# Patient Record
Sex: Male | Born: 1950
Health system: Southern US, Community
[De-identification: ages and names within clinical notes are randomized; demographics above are authoritative.]

## PROBLEM LIST (undated history)

## (undated) DIAGNOSIS — H409 Unspecified glaucoma: Secondary | ICD-10-CM

## (undated) DIAGNOSIS — I779 Disorder of arteries and arterioles, unspecified: Secondary | ICD-10-CM

## (undated) DIAGNOSIS — I739 Peripheral vascular disease, unspecified: Secondary | ICD-10-CM

## (undated) DIAGNOSIS — H5440 Blindness, one eye, unspecified eye: Secondary | ICD-10-CM

## (undated) DIAGNOSIS — F1011 Alcohol abuse, in remission: Secondary | ICD-10-CM

## (undated) DIAGNOSIS — F419 Anxiety disorder, unspecified: Secondary | ICD-10-CM

## (undated) DIAGNOSIS — E1142 Type 2 diabetes mellitus with diabetic polyneuropathy: Principal | ICD-10-CM

## (undated) DIAGNOSIS — M79603 Pain in arm, unspecified: Secondary | ICD-10-CM

## (undated) DIAGNOSIS — IMO0002 Reserved for concepts with insufficient information to code with codable children: Secondary | ICD-10-CM

## (undated) DIAGNOSIS — I10 Essential (primary) hypertension: Secondary | ICD-10-CM

## (undated) DIAGNOSIS — R9430 Abnormal result of cardiovascular function study, unspecified: Secondary | ICD-10-CM

## (undated) DIAGNOSIS — L0390 Cellulitis, unspecified: Secondary | ICD-10-CM

## (undated) DIAGNOSIS — Z720 Tobacco use: Secondary | ICD-10-CM

## (undated) DIAGNOSIS — J449 Chronic obstructive pulmonary disease, unspecified: Secondary | ICD-10-CM

## (undated) DIAGNOSIS — I2510 Atherosclerotic heart disease of native coronary artery without angina pectoris: Secondary | ICD-10-CM

## (undated) DIAGNOSIS — Z889 Allergy status to unspecified drugs, medicaments and biological substances status: Secondary | ICD-10-CM

## (undated) DIAGNOSIS — N529 Male erectile dysfunction, unspecified: Secondary | ICD-10-CM

## (undated) DIAGNOSIS — L4050 Arthropathic psoriasis, unspecified: Secondary | ICD-10-CM

## (undated) DIAGNOSIS — M797 Fibromyalgia: Secondary | ICD-10-CM

## (undated) DIAGNOSIS — E785 Hyperlipidemia, unspecified: Secondary | ICD-10-CM

## (undated) DIAGNOSIS — K219 Gastro-esophageal reflux disease without esophagitis: Secondary | ICD-10-CM

## (undated) DIAGNOSIS — I6340 Cerebral infarction due to embolism of unspecified cerebral artery: Secondary | ICD-10-CM

## (undated) DIAGNOSIS — Z951 Presence of aortocoronary bypass graft: Secondary | ICD-10-CM

## (undated) DIAGNOSIS — Z789 Other specified health status: Secondary | ICD-10-CM

## (undated) DIAGNOSIS — H9319 Tinnitus, unspecified ear: Secondary | ICD-10-CM

## (undated) HISTORY — DX: Atherosclerotic heart disease of native coronary artery without angina pectoris: I25.10

## (undated) HISTORY — DX: Other specified health status: Z78.9

## (undated) HISTORY — DX: Type 2 diabetes mellitus with diabetic polyneuropathy: E11.42

## (undated) HISTORY — DX: Fibromyalgia: M79.7

## (undated) HISTORY — DX: Peripheral vascular disease, unspecified: I73.9

## (undated) HISTORY — DX: Disorder of arteries and arterioles, unspecified: I77.9

## (undated) HISTORY — DX: Male erectile dysfunction, unspecified: N52.9

## (undated) HISTORY — DX: Cellulitis, unspecified: L03.90

## (undated) HISTORY — DX: Essential (primary) hypertension: I10

## (undated) HISTORY — DX: Reserved for concepts with insufficient information to code with codable children: IMO0002

## (undated) HISTORY — DX: Presence of aortocoronary bypass graft: Z95.1

## (undated) HISTORY — DX: Pain in arm, unspecified: M79.603

## (undated) HISTORY — DX: Tinnitus, unspecified ear: H93.19

## (undated) HISTORY — DX: Abnormal result of cardiovascular function study, unspecified: R94.30

## (undated) HISTORY — DX: Alcohol abuse, in remission: F10.11

## (undated) HISTORY — DX: Arthropathic psoriasis, unspecified: L40.50

## (undated) HISTORY — DX: Hyperlipidemia, unspecified: E78.5

## (undated) HISTORY — DX: Cerebral infarction due to embolism of unspecified cerebral artery: I63.40

## (undated) HISTORY — PX: CATARACT EXTRACTION: SUR2

## (undated) HISTORY — DX: Allergy status to unspecified drugs, medicaments and biological substances: Z88.9

## (undated) HISTORY — DX: Unspecified glaucoma: H40.9

## (undated) HISTORY — PX: CORONARY ARTERY BYPASS GRAFT: SHX141

## (undated) HISTORY — DX: Tobacco use: Z72.0

---

## 2007-09-18 DIAGNOSIS — Z951 Presence of aortocoronary bypass graft: Secondary | ICD-10-CM

## 2007-09-18 HISTORY — DX: Presence of aortocoronary bypass graft: Z95.1

## 2007-10-05 ENCOUNTER — Ambulatory Visit: Payer: Self-pay | Admitting: Cardiovascular Disease

## 2007-10-05 ENCOUNTER — Inpatient Hospital Stay (HOSPITAL_COMMUNITY): Admission: AD | Admit: 2007-10-05 | Discharge: 2007-10-14 | Payer: Self-pay | Admitting: Cardiovascular Disease

## 2007-10-06 ENCOUNTER — Ambulatory Visit: Payer: Self-pay | Admitting: Cardiothoracic Surgery

## 2007-10-06 ENCOUNTER — Encounter: Payer: Self-pay | Admitting: Cardiovascular Disease

## 2007-10-06 ENCOUNTER — Encounter: Payer: Self-pay | Admitting: Cardiothoracic Surgery

## 2007-10-30 ENCOUNTER — Ambulatory Visit: Payer: Self-pay | Admitting: Cardiothoracic Surgery

## 2007-10-30 ENCOUNTER — Encounter: Admission: RE | Admit: 2007-10-30 | Discharge: 2007-10-30 | Payer: Self-pay | Admitting: Cardiothoracic Surgery

## 2007-11-09 ENCOUNTER — Encounter: Payer: Self-pay | Admitting: Cardiology

## 2007-11-09 ENCOUNTER — Inpatient Hospital Stay (HOSPITAL_COMMUNITY): Admission: EM | Admit: 2007-11-09 | Discharge: 2007-11-11 | Payer: Self-pay | Admitting: Cardiology

## 2007-11-09 ENCOUNTER — Ambulatory Visit: Payer: Self-pay | Admitting: Cardiology

## 2007-11-10 ENCOUNTER — Encounter (INDEPENDENT_AMBULATORY_CARE_PROVIDER_SITE_OTHER): Payer: Self-pay | Admitting: Cardiology

## 2007-12-03 ENCOUNTER — Ambulatory Visit: Payer: Self-pay | Admitting: Cardiology

## 2008-02-01 ENCOUNTER — Ambulatory Visit: Payer: Self-pay | Admitting: Cardiology

## 2008-08-23 ENCOUNTER — Ambulatory Visit: Payer: Self-pay | Admitting: Cardiology

## 2008-09-05 IMAGING — CR DG CHEST 1V PORT
1 series · 1 of 1 positions shown · non-contrast
Comparison: 10/30/2007

CLINICAL DATA: Unstable angina

PORTABLE CHEST - 1 VIEW

[AP]
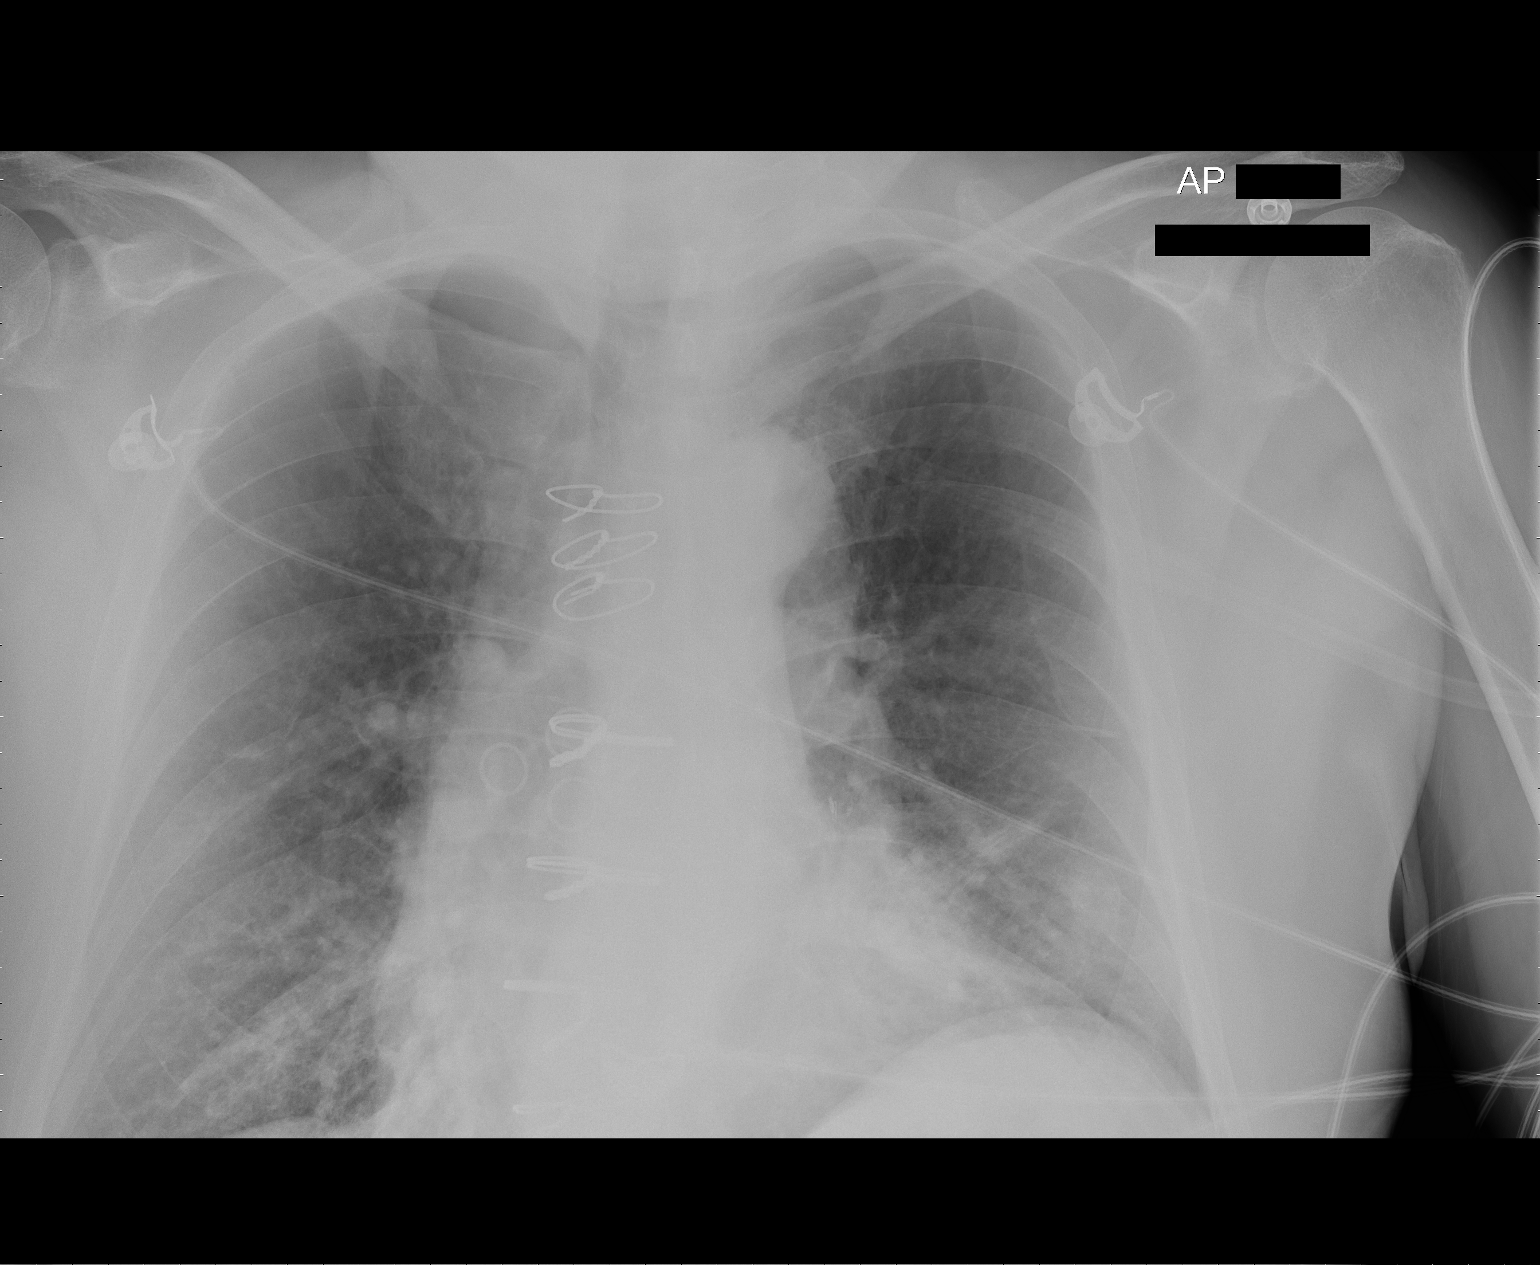

[1 of 1 positions shown; findings below may reference images not displayed]

FINDINGS: There are surgical changes related to bypass surgery.
The heart is mildly enlarged but stable.  There are chronic lung
changes and COPD with probable mild overlying asymmetric edema.  No
effusions.  No pneumothorax.
IMPRESSION: 1.  Cardiac enlargement and probable mild asymmetric interstitial
edema.
2.  Underlying chronic lung changes/COPD.

## 2009-04-03 ENCOUNTER — Encounter: Payer: Self-pay | Admitting: Cardiology

## 2009-04-04 ENCOUNTER — Encounter (INDEPENDENT_AMBULATORY_CARE_PROVIDER_SITE_OTHER): Payer: Self-pay | Admitting: *Deleted

## 2009-04-05 ENCOUNTER — Ambulatory Visit: Payer: Self-pay | Admitting: Cardiology

## 2010-02-19 ENCOUNTER — Ambulatory Visit (HOSPITAL_COMMUNITY): Admission: RE | Admit: 2010-02-19 | Discharge: 2010-02-19 | Payer: Self-pay | Admitting: Ophthalmology

## 2010-04-09 ENCOUNTER — Ambulatory Visit: Payer: Self-pay | Admitting: Cardiology

## 2010-04-10 ENCOUNTER — Telehealth (INDEPENDENT_AMBULATORY_CARE_PROVIDER_SITE_OTHER): Payer: Self-pay | Admitting: *Deleted

## 2010-05-02 ENCOUNTER — Ambulatory Visit: Payer: Self-pay | Admitting: Cardiology

## 2010-06-19 NOTE — Progress Notes (Signed)
Summary: Update meds  Phone Note Call from Patient Call back at Home Phone [phone]   Summary of Call: Pt called and left message on voicemail stating all meds per instructions sheet were correct with the addition of hydrocodone as needed.  Initial call taken by: Cyril Loosen, RN, BSN,  April 10, 2010 3:06 PM    New/Updated Medications: HYDROCODONE-ACETAMINOPHEN 5-500 MG TABS (HYDROCODONE-ACETAMINOPHEN) as needed pain

## 2010-06-19 NOTE — Assessment & Plan Note (Signed)
Summary: 1 YR FU PER NOV REMINDER   Visit Type:  Follow-up Referring Provider:  Carolan Clines Primary Provider:  Farrell Ours  CC:  CAD.  History of Present Illness: The patient is seen for followup of coronary artery disease.  I saw him last November, 2010.  He underwent CABG in 2009.  Following this he did need repeat catheterization area his leg was patent to the LAD with competitive filling.  There was patent SVG to the diagonal.  There was an occluded SVG to the distal marginal with some collateralization.  The vein graft to the distal posterior lateral was patent.  He is doing well and not having any significant chest pain.  He is on medications for his cholesterol and these are followed by his primary care team.  The patient has erectile dysfunction.  He has asked if he can use a medication for this.  These meds are contraindicated while he is on isosorbide.  Preventive Screening-Counseling & Management  Alcohol-Tobacco     Smoking Status: current     Smoking Cessation Counseling: yes     Packs/Day: 1 PPD  Current Medications (verified): 1)  Isosorbide Mononitrate Cr 30 Mg Xr24h-Tab (Isosorbide Mononitrate) .... Take 1 Tablet By Mouth Once A Day 2)  Metoprolol Tartrate 25 Mg Tabs (Metoprolol Tartrate) .... Take 1 Tablet By Mouth Two Times A Day 3)  Benazepril Hcl 20 Mg Tabs (Benazepril Hcl) .... Take 2 Tablet By Mouth Once A Day 4)  Crestor 40 Mg Tabs (Rosuvastatin Calcium) .... Take One Tablet By Mouth Daily. 5)  Fexofenadine Hcl 180 Mg Tabs (Fexofenadine Hcl) .... Take 1 Tablet By Mouth Once A Day 6)  Gabapentin 600 Mg Tabs (Gabapentin) .... Take 1 Tablet By Mouth Three Times A Day 7)  Nitrostat 0.4 Mg Subl (Nitroglycerin) .Marland Kitchen.. 1 Tablet Under Tongue At Onset of Chest Pain; You May Repeat Every 5 Minutes For Up To 3 Doses. 8)  Bupropion Hcl 150 Mg Xr12h-Tab (Bupropion Hcl) .... Take 1 Tablet By Mouth Once A Day 9)  Fish Oil 1000 Mg Caps (Omega-3 Fatty Acids) .... Take 1 Tablet  By Mouth Once A Day 10)  Alprazolam 1 Mg Tabs (Alprazolam) .... Take 1 Tablet By Mouth Three Times A Day As Needed 11)  Amitriptyline Hcl 50 Mg Tabs (Amitriptyline Hcl) .... Take 1 Tablet By Mouth Once A Day At Bedtime 12)  Enbrel 50 Mg/ml Soln (Etanercept) .... One Injection Weekly  Allergies (verified): 1)  ! Paxil 2)  ! * Lamisil  Comments:  Nurse/Medical Assistant: The patient is currently on medications but does not know the name or dosage at this time. Instructed to contact our office with details. Will update medication list at that time.  Past History:  Past Medical History: HYPERLIPIDEMIA-MIXED (ICD-272.4) HYPERTENSION, UNSPECIFIED (ICD-401.9) CAD, NATIVE VESSEL (ICD-414.01)... catheterization.. November 10, 2007... patent LIMA to the LAD with competitive filling, patent SVG to diagonal that enters a bifurcation point, occluded SVG to the distal marginal with some collateralization, patent SVG to distal posterior lateral... medical therapy recommend CABG.... May, 2009 .Plavix... allergy EF 55%... echo... June, 2009... hypokinesis of the mid and distal septal wall... technically difficult study Diabetes. Tobacco he is a  Fibromyalgia.  Psoriatic arthritis.  History of alcohol abuse in the past.  Status post right lower extremity cellulitis.  Continued discomfort in his arm and he is seeing Dr. Amanda Pea Erectile dysfunction  Social History: Packs/Day:  1 PPD  Review of Systems       Patient denies fever,  chills, headache, sweats, rash, change in vision, change in hearing, chest pain, cough, nausea vomiting, urinary symptoms.  All other systems are reviewed and are negative.  Vital Signs:  Patient profile:   60 year old male Height:      70 inches Weight:      232 pounds BMI:     33.41 Pulse rate:   67 / minute BP sitting:   126 / 79  (left arm) Cuff size:   large  Vitals Entered By: Carlye Grippe (April 09, 2010 8:33 AM)  Nutrition Counseling: Patient's BMI is  greater than 25 and therefore counseled on weight management options.  Physical Exam  General:  The patient is stable.  He does smell of cigarette smoke. Head:  head is atraumatic. Eyes:  no xanthelasma. Neck:  no jugular venous distention. Chest Wall:  no chest wall tenderness. Lungs:  lungs are clear.  Respiratory effort is nonlabored. Heart:  cardiac exam reveals S1 and S2.  No click or significant murmurs. Abdomen:  abdomen is soft. Msk:  no musculoskeletal deformities. Extremities:  no peripheral edema. Skin:  no skin rashes. Psych:  patient is oriented to person time and place.  Affect is normal.   Impression & Recommendations:  Problem # 1:  * ERECTILE DYSFUNCTION The patient has requested permission to use a medication for erectile dysfunction.  I had a careful discussion with him and made it clear that he would first have to come off his nitrate.  He has been very stable and would like to try this.  Therefore the nitrate will be held and all see him for followup.  If he appears to be completely stable we will then be able to have medication for erectile dysfunction.  Problem # 2:  * RIGHT ARM DISCOMFORT He continues to have his right arm discomfort.  This is not ischemic.  Problem # 3:  TOBACCO ABUSE (ICD-305.1) The patient continues to smoke.  He says he has cut down.  I have encouraged him to stop.  Problem # 4:  HYPERLIPIDEMIA-MIXED (ICD-272.4)  His updated medication list for this problem includes:    Crestor 40 Mg Tabs (Rosuvastatin calcium) .Marland Kitchen... Take one tablet by mouth daily. The patient is on medication.  His labs are checked by his primary care team.  He has been referred to a dietitian by them.  No further workup by me at this time.  Problem # 5:  HYPERTENSION, UNSPECIFIED (ICD-401.9)  The following medications were removed from the medication list:    Aspirin 81 Mg Tbec (Aspirin) .Marland Kitchen... Take one tablet by mouth daily    Amlodipine Besylate 10 Mg Tabs  (Amlodipine besylate) .Marland Kitchen... Take 1 tablet by mouth once a day His updated medication list for this problem includes:    Metoprolol Tartrate 25 Mg Tabs (Metoprolol tartrate) .Marland Kitchen... Take 1 tablet by mouth two times a day    Benazepril Hcl 20 Mg Tabs (Benazepril hcl) .Marland Kitchen... Take 2 tablet by mouth once a day    Aspirin 81 Mg Tbec (Aspirin) .Marland Kitchen... Take one tablet by mouth daily Blood pressure is controlled.  No change in therapy.  Problem # 6:  CAD, NATIVE VESSEL (ICD-414.01) EKG is done today and reviewed by me.  He has nonspecific ST-T wave changes.  There is no acute change.  As outlined above we will hold his nitrate and see him for followup.  Other Orders: EKG w/ Interpretation (93000)  Patient Instructions: 1)  Follow up with Dr. Myrtis Ser on [place],  May 02, 2010 at 10:30am. 2)  Hold Imdur. 3)  Start Aspirin 81mg  by mouth once daily. 4)  Your physician discussed the hazards of tobacco use.  Tobacco use cessation is recommended and techniques and options to help you quit were discussed.

## 2010-06-21 NOTE — Assessment & Plan Note (Signed)
Summary: 3 WK F/U PER 04/09/10 OV-JM   Visit Type:  Follow-up Referring Orlena Garmon:  Carolan Clines Primary Jeniya Flannigan:  Farrell Ours  CC:  CAD.  History of Present Illness: Patient is seen for followup of coronary artery disease.  I saw him last we stopped his isosorbide.  He has not had any chest pain.  He is stable.  He can be allowed to use Viagra or an equivalent.  However, he cannot use isosorbide for nitroglycerin.  I made it very clear to him in the discussion.  Preventive Screening-Counseling & Management  Alcohol-Tobacco     Smoking Status: current     Smoking Cessation Counseling: yes     Packs/Day: 1 PPD  Current Medications (verified): 1)  Metoprolol Tartrate 25 Mg Tabs (Metoprolol Tartrate) .... Take 1 Tablet By Mouth Two Times A Day 2)  Benazepril Hcl 20 Mg Tabs (Benazepril Hcl) .... Take 2 Tablet By Mouth Once A Day 3)  Crestor 40 Mg Tabs (Rosuvastatin Calcium) .... Take One Tablet By Mouth Daily. 4)  Fexofenadine Hcl 180 Mg Tabs (Fexofenadine Hcl) .... Take 1 Tablet By Mouth Once A Day 5)  Gabapentin 600 Mg Tabs (Gabapentin) .... Take 1 Tablet By Mouth Three Times A Day 6)  Nitrostat 0.4 Mg Subl (Nitroglycerin) .Marland Kitchen.. 1 Tablet Under Tongue At Onset of Chest Pain; You May Repeat Every 5 Minutes For Up To 3 Doses. 7)  Bupropion Hcl 150 Mg Xr12h-Tab (Bupropion Hcl) .... Take 1 Tablet By Mouth Once A Day 8)  Fish Oil 1000 Mg Caps (Omega-3 Fatty Acids) .... Take 1 Tablet By Mouth Once A Day 9)  Alprazolam 1 Mg Tabs (Alprazolam) .... Take 1 Tablet By Mouth Three Times A Day As Needed 10)  Amitriptyline Hcl 50 Mg Tabs (Amitriptyline Hcl) .... Take 1 Tablet By Mouth Once A Day At Bedtime 11)  Enbrel 50 Mg/ml Soln (Etanercept) .... One Injection Weekly 12)  Aspirin 81 Mg Tbec (Aspirin) .... Take One Tablet By Mouth Daily 13)  Hydrocodone-Acetaminophen 5-500 Mg Tabs (Hydrocodone-Acetaminophen) .... As Needed Pain 14)  Metformin Hcl 1000 Mg Tabs (Metformin Hcl) .... Take 1 Tablet By  Mouth Two Times A Day 15)  Amlodipine Besylate 10 Mg Tabs (Amlodipine Besylate) .... Take 1 Tablet By Mouth Once A Day  Allergies (verified): 1)  ! Paxil 2)  ! * Lamisil  Comments:  Nurse/Medical Assistant: The patient is currently on medications but does not know the name or dosage at this time. Instructed to contact our office with details. Will update medication list at that time.  Past History:  Past Medical History: HYPERLIPIDEMIA-MIXED (ICD-272.4) HYPERTENSION, UNSPECIFIED (ICD-401.9) CAD, NATIVE VESSEL (ICD-414.01)... catheterization.. November 10, 2007... patent LIMA to the LAD with competitive filling, patent SVG to diagonal that enters a bifurcation point, occluded SVG to the distal marginal with some collateralization, patent SVG to distal posterior lateral... medical therapy recommend CABG.... May, 2009 .Plavix... allergy EF 55%... echo... June, 2009... hypokinesis of the mid and distal septal wall... technically difficult study Diabetes. Tobacco he is a  Fibromyalgia.  Psoriatic arthritis. .. History of alcohol abuse in the past.  Status post right lower extremity cellulitis.  Continued discomfort in his arm and he is seeing Dr. Amanda Pea Erectile dysfunction  Review of Systems       Patient denies fever, chills, headache, sweats, rash, change in vision, change in hearing, chest pain, cough, nausea vomiting, urinary symptoms.  All other systems are reviewed and are negative.  Vital Signs:  Patient profile:   60  year old male Height:      70 inches Weight:      231 pounds Pulse rate:   78 / minute BP sitting:   105 / 71  (left arm) Cuff size:   large  Vitals Entered By: Carlye Grippe (May 02, 2010 10:46 AM)  Physical Exam  General:  patient is stable. Eyes:  no xanthelasma. Neck:  no jugulovenous distention. Lungs:  lungs are clear.  Respiratory effort is nonlabored. Heart:  cardiac exam reveals S1-S2.  No clicks or significant murmurs. Abdomen:  abdomen  is soft. Extremities:  no peripheral edema. Psych:  patient is oriented to person time and place.  Affect is normal.   Impression & Recommendations:  Problem # 1:  * ERECTILE DYSFUNCTION The patient now has permission to use Viagra or an equivalent.  I have instructed him not to use isosorbide and not to use nitroglycerin.  Problem # 2:  TOBACCO ABUSE (ICD-305.1) The patient continues to smoke and I have counseled him again today to stop.  Problem # 3:  CAD, NATIVE VESSEL (ICD-414.01)  His updated medication list for this problem includes:    Metoprolol Tartrate 25 Mg Tabs (Metoprolol tartrate) .Marland Kitchen... Take 1 tablet by mouth two times a day    Benazepril Hcl 20 Mg Tabs (Benazepril hcl) .Marland Kitchen... Take 2 tablet by mouth once a day    Nitrostat 0.4 Mg Subl (Nitroglycerin) .Marland Kitchen... 1 tablet under tongue at onset of chest pain; you may repeat every 5 minutes for up to 3 doses.    Aspirin 81 Mg Tbec (Aspirin) .Marland Kitchen... Take one tablet by mouth daily    Amlodipine Besylate 10 Mg Tabs (Amlodipine besylate) .Marland Kitchen... Take 1 tablet by mouth once a day Coronary disease is stable.  No further workup.  All seem back in 6 months.  I encouraged him to start aspirin.  He agreed to this at the time of his last visit but in fact did not start.  He has agreed today to start aspirin 81 mg at least 4 days per week.  He is to take it with food.  Patient Instructions: 1)  Your physician wants you to follow-up in: 6 months. You will receive a reminder letter in the mail one-two months in advance. If you don't receive a letter, please call our office to schedule the follow-up appointment. 2)  Your physician recommends that you continue on your current medications as directed. Please refer to the Current Medication list given to you today.

## 2010-09-20 ENCOUNTER — Other Ambulatory Visit: Payer: Self-pay | Admitting: Cardiology

## 2010-10-02 NOTE — Consult Note (Signed)
NAMEJABRIL, Eddie Reed NO.:  [id number]   MEDICAL RECORD NO.:  [id number]          PATIENT TYPE:  INP   LOCATION:  2911                         FACILITY:  [facility]   PHYSICIAN:  Kerin Perna, M.D.  DATE OF BIRTH:  15-Jan-1951   DATE OF CONSULTATION:  10/06/2007  DATE OF DISCHARGE:                                 CONSULTATION   PHYSICIAN REQUESTING CONSULTATION:  Veverly Fells. Excell Seltzer, MD   CONSULTANT:  Kerin Perna, MD   REASON FOR CONSULTATION:  Acute MI with severe multivessel coronary  artery disease.   CHIEF COMPLAINT:  Severe chest pain.   HISTORY OF PRESENT ILLNESS:  I was asked to evaluate this 60 year old  white male smoker for potential surgical coronary revascularization for  recently diagnosed severe multivessel coronary disease with presentation  during an acute anterior MI.  The patient had a LAD stent placed at  [facility] 6 years previously.  He is allergic to PLAVIX and has  not been followed closely by a cardiologist.  He has had progressive  fatigue and chest discomfort over the past 2 weeks, which accelerated to  severe substernal squeezing chest pain associated with shortness of  breath, but without syncope, diaphoresis, or vomiting.  He was evaluated  by the [facility] and found to have anterolateral ST-elevation MI  and was brought directly to [facility] for urgent cardiac  catheterization, which was performed yesterday evening by Dr. Excell Seltzer.  The findings of cath include an acute thrombosis of his LAD stent with  associated chronic occlusion of the circumflex and high grade 95%  stenosis of a dominant right coronary artery.  His EF was 60% and LVEDP  was 19 mmHg.  The patient was given Ticlid 250 mg p.o. prior to his  urgent cath (he is allergic to PLAVIX which causes hives).  He underwent  angioplasty and subsequent bare-metal stenting of the LAD with  reperfusion of the LAD.  The patient has been hemodynamically stable  without chest pain, on IV Integrilin and nitroglycerin since his cardiac  catheterization.  Due to his severe multivessel disease, he was felt to  be a candidate for surgical revascularization after he recovers from his  MI.  His peak troponin was greater than 100 and his CPK-MB peak was 270  ng/mL.   PAST MEDICAL HISTORY:  1. Hypertension.  2. Psoriatic arthritis.  3. Dyslipidemia.  4. BPH.  5. Fibromyalgia.  6. Glucose intolerance.   HOME MEDICATIONS:  1. Aspirin 325 mg b.i.d.  2. Lotensin 20 mg daily.  3. Allegra 180 mg daily.  4. Avodart 0.5 mg daily.  5. Tizanidine 4 mg b.i.d.  6. Xanax 1 mg b.i.d.  7. Amlodipine 10 mg daily.  8. Lopressor 25 mg b.i.d.  9. Zantac 150 p.o. b.i.d.   SOCIAL HISTORY:  The patient is a retired [employer].  He is divorced.  He smokes one to one and a half pack of cigarettes a day and drinks one  pint of vodka per week.  He enjoys his yard work and working outside.   FAMILY HISTORY:  Negative for premature coronary disease.  Positive for  MI in his father at age 46.   REVIEW OF SYSTEMS:  CONSTITUTIONAL:  Negative for fever, weight has been  stable at 220 pounds.  HEENT:  Negative for active dental problems.  He  has a total upper dental plate and a partial lower dental plate.  He  denies difficulty swallowing.  THORACIC:  Negative for history of chest  trauma, but he does have a chronic productive cough in the morning.  He  denies recent symptoms of upper respiratory infection.  CARDIAC:  Positive for his multivessel coronary disease as described above with an  acute MI.  He has no history of cardiac murmur.  GI:  Negative for  hepatitis, jaundice, or blood per rectum.  VASCULAR:  Negative for DVT,  claudication, and TIA.  NEUROLOGICAL:  Positive for neuropathy type  decreased sensation of his left lower extremity and right upper  extremity, attributed to a medication for rheumatoid arthritis, which he  received from a rheumatologist at  [facility].  He denies any  bleeding diathesis or prior blood transfusions.  He denies history of  stroke or seizure.   PHYSICAL EXAMINATION:  The patient is 5 feet 10-1/2 inch and weighs 220  pounds; blood pressure is 160/88; heart rate is 80, sinus; saturation  97% on 2 L.  He is afebrile.  GENERAL APPEARANCE:  That of a middle-aged, overweight white male in the  CCU following cardiac cath, in no acute distress.  HEENT:  Normocephalic and atraumatic.  NECK:  Without JVD, mass, or carotid bruit.  LYMPHATICS:  Reveal no palpable supraclavicular or cervical adenopathy.  Breath sounds are distant with scattered rhonchi.  CARDIAC:  Regular rhythm without S3 gallop or murmur.  ABDOMEN:  Obese, soft, and nontender without pulsatile mass appreciated.  EXTREMITIES:  Reveal mild clubbing, but no cyanosis or edema.  Peripheral pulses are 2+ in all extremities and there is no sign of  venous insufficiency of the lower extremities.  I see no significant  psoriatic rash on his chest.  NEUROLOGICAL:  Nonfocal and he is alert and oriented.   LABORATORY DATA:  Reviewed his coronary arteriograms and his chest x-  ray.  He has evidence of COPD with interstitial lung disease and severe  multivessel coronary disease with fairly well-preserved LV-systolic  function.  He is ruled in for an anterior MI with strongly positive  cardiac enzymes with troponin greater than 100.  His BUN and creatinine  are normal.  His platelet count is greater than 150,000.   PLAN:  The patient will be prepared for multivessel coronary bypass  surgery.  I agree with proceeding with surgery as he has had an acute  delayed stent thrombosis and he is intolerant of Plavix for further PCI  therapy of his coronary disease.  We should be able to graft his LAD  diagonal, distal circumflex, and distal RCA vessels.  He understands the  indications, benefits, recovery, time, and risks of surgery and agrees  to proceed with  surgery tentatively scheduled for Thursday Oct 08, 2007.      Kerin Perna, M.D.  Electronically Signed    PV/MEDQ  D:  10/06/2007  T:  10/07/2007  Job:  [id number]

## 2010-10-02 NOTE — Assessment & Plan Note (Signed)
OFFICE VISIT   Eddie Reed  DOB:  [birth year]                                        October 30, 2007  CHART #:  [id number]   CURRENT PROBLEMS:  1. Status post CABG x4, 10/18/2007, for severe three-vessel disease      following an acute MI with LAD occlusion and troponin greater than      100.  2. Psoriatic arthritis and fibromyalgia.  3. Hypertension.  4. Postoperative delirium and history of alcohol use.   CURRENT MEDICATIONS:  1. Aspirin.  2. Avodart.  3. Lisinopril 10 mg b.i.d.  4. Tizanidine 4 mg b.i.d.  5. Fexofenadine 180 mg a day (he is allergic to Plavix).   HISTORY OF PRESENT ILLNESS:  Eddie Reed returns for his first office  visit, postop CABG x4 for severe multivessel disease and acute MI.  Fortunately, his occluded LAD was intervened early, and his EF is still  fairly well preserved.  He has not had any more hallucinations or signs  of delirium following discharge from the hospital.  He denies recurrent  angina or symptoms of CHF.  He is anxious to start driving and increase  his activity level.  His medications are listed above and for some  reason, he is not taking  metoprolol, which I told him to start 25 mg  b.i.d.  The surgical incisions are healing well.   PHYSICAL EXAMINATION:  Blood pressure 116/80, pulse 102 and regular,  respirations 20, saturation 97%.  He is alert and oriented x3.  Breath sounds are clear and equal.  The sternum is stable and healing  well.  The leg incision is healing, and there is no significant peripheral  edema.   There are some small areas of erythema along the pretibial area close to  the endovein tunnel, which is probably mild cellulitis.   PA and lateral chest x-ray reveals clear lung fields with a stable  cardiac silhouette and his sternal wires are all intact.   PLAN:  The patient was encouraged to remain on a heart healthy diet, to  stay off cigarettes, and to avoid heavy alcohol intake.  I  called  [place] card master to arrange for a followup Cardiology appointment in  the [facility] office.  I have made sure that he would be resuming Lopressor  25 mg b.i.d., in addition to his above medications and provided with a 5-  day course of oral Keflex for the mild cellulitis in his leg.  Otherwise, he knows not lift more than 100 pounds until three months  after surgery.  He plans on going to [facility] for a new  immunosuppressive drug for his psoriatic arthritis, and I told him that  after another month of healing that would be safe to start.  Otherwise,  he will return here as needed.   Eddie Reed, M.D.  Electronically Signed   PV/MEDQ  D:  10/30/2007  T:  10/31/2007  Job:  [id number]   cc:   [facility] Cardiology Office, [place]  Dr. Sherryll Burger, Primary Care Doctor, Jonita Albee

## 2010-10-02 NOTE — H&P (Signed)
NAMEBRAYSON, Reed                ACCOUNT NO.:  [id number]   MEDICAL RECORD NO.:  [id number]          PATIENT TYPE:  INP   LOCATION:  2916                         FACILITY:  [facility]   PHYSICIAN:  Lowella Bandy, MD      DATE OF BIRTH:  04-15-1951   DATE OF ADMISSION:  11/09/2007  DATE OF DISCHARGE:                              HISTORY & PHYSICAL   PRIMARY CARDIOLOGIST:  [facility], patient uncertain of  assignment.   CHIEF COMPLAINT:  Chest pain.   HISTORY OF PRESENT ILLNESS:  [name] is a 60 year old white male with  coronary artery disease, with a recent anterior ST elevation MI; treated  emergently with primary PCI with bare metal stent to the LAD and  subsequent coronary artery bypass graft surgery on Oct 08, 2007 for  severe diffuse three-vessel disease.  He presented earlier today with  chest pain.  He reports that yesterday he had some brief episodes of  burning in his chest.  He awoke this morning and the pain was more  significant and did not go away.  He reports that yesterday it had been  relieved by a combination of nitroglycerin and Tums.  As the pain  progressed, he presented to the emergency room in [place].  He denies any  fever, chills, cough or pleuritic component to his chest pain.  He  reports that the pain is somewhat different than his previous MI.  His  initial troponin was negative at 0.01, but subsequent troponin was up to  0.64.  He was initiated on nitroglycerin drip in addition to aggressive  anticoagulation, and transferred for further management.   PAST MEDICAL HISTORY:  1. Coronary artery disease.  Status post anterior ST elevation      myocardial infarction May 2009; with bare metal stent to the LAD.      Subsequent four-vessel coronary artery bypass grafting with LIMA to      the LAD, SVG to diagonal, SVG to first obtuse marginal, and SVG to      the posterolateral branch of the right coronary artery on Oct 08, 2007.  Ejection fraction  was 60%.  Of note, his recent anterior ST      elevation MI was a very late stent thrombosis of a previous stent      that was placed in his LAD.  2. Hypertension.  3. Newly diagnosed diabetes mellitus.  4. Psoriasis, with psoriatic arthritis.  5. Fibromyalgia.  6. History of heavy alcohol use with postoperative delirium.   MEDICATIONS:  1. Aspirin 81 mg daily.  2. Lisinopril 10 mg b.i.d.  3. Metoprolol 25 mg b.i.d.  4. Avodart.  5. Tizanidine 4 mg b.i.d.   ALLERGIES:  PLAVIX.   SOCIAL HISTORY:  The patient acknowledges heavy alcohol intake  sporadically, particularly on the weekends.  He reports that he does not  drink alcohol daily.  His last beer was on Saturday, June 20.  He  continues to smoke cigarettes.   FAMILY HISTORY:  Negative for any premature coronary artery disease.  Father died at age  83 from myocardial infarction.   REVIEW OF SYSTEMS:  Positive for erythema in his right lower extremity  at the site of his saphenous vein graft harvesting, for which he has  been treated with antibiotic -- of which he is not certain.  Otherwise,  10 systems reviewed negative other than as noted above in the HPI.   PHYSICAL EXAMINATION:  Temperature 98.5, blood pressure 139/89, pulse  110, oxygen saturation 98% on 2 liters nasal cannula.  GENERAL:  The  patient breathing comfortably in no apparent distress.  HEENT:  Normocephalic, atraumatic.  Oropharynx clear.  NECK:  Supple.  No masses appreciated.  No jugular venous distention.  CARDIOVASCULAR:  Tachycardic and regular.  A 2/6 systolic ejection  murmur at the left upper sternal border; no rub or gallop appreciated.  CHEST:  Clear to auscultation bilaterally; no wheezes or rhonchi.  ABDOMEN:  Soft, nontender and nondistended.  Normal bowel sounds.  EXTREMITIES:  Warm with no edema.  Erythema on the distal portion of the  right lower extremity is mildly warm to the touch; no purulent drainage.  NEUROLOGIC:  Alert and oriented  x3; moving all extremities well.  No  cranial nerve deficits appreciated.  SKIN:  Erythema of the right lower  extremity as noted.  Otherwise no rashes.  Notable psoriatic changes in  his nails.  PSYCHIATRIC:  Alert and oriented x3, affect appropriate.  MUSCULOSKELETAL:  No joint effusions or erythema at this time.   EKG:  Reviewed and shows sinus tachycardia at 107 beats per minute, left  axis deviation, anteroseptal infarct, no acute ST or T-wave changes  appreciated.  We are requesting his old chart with his previous EKG.   LABORATORY STUDIES:  Laboratory values are reviewed.  White blood cell  count 11.6, hemoglobin 15.3, platelets 365.  Sodium 136, potassium 4.4,  chloride 106, bicarb 24, BUN 15, creatinine 0.8, glucose 157.  INR 1.0.  CK initially 31, repeat up to 70, CK-MB initial 1.2 up to 9.9.  Troponin  initially 0.01 up to 0.64.   ASSESSMENT:  A 60 year old male with coronary artery disease and recent  anterior ST elevation MI with PCI and subsequent coronary artery bypass  grafting, as outlined above.  He presents with chest pain concerning for  acute coronary syndrome.  His troponin rise is consistent with an ACS.  His symptoms have both typical and atypical features, and pericarditis  and pulmonary embolism are also in the differential -- although appear  less likely at this point.   PLAN:  1. We will admit him to the CCU and cycle cardiac enzymes and EKGs.  2. He has been placed on aggressive anticoagulation with aspirin,      Lovenox, and Integrilin.  Of note, he has a Plavix allergy.  His      Lovenox was last dosed shortly after 8 o'clock p.m. on November 09, 2007.  3. We will continue nitroglycerin drip in an effort to relieve his      chest pain.  We will also titrate up his beta blocker therapy and      continue his ACE inhibitor.  We will check liver enzyme testing and      start empiric Lipitor as well.  4. We will check an echocardiogram to reevaluate his  left ventricular      function as well for the presence of any possible pericardial      effusion.  A chest x-ray is also pending.  5. We will initiate Keflex for apparent mild cellulitis of his right      lower extremity.  6. We will initiate therapy for his diabetes, and it seems likely that      the patient may need long-term medical      therapy for this.  7. It seems likely that if his cardiac enzymes continue to rise, that      he will likely need a repeat cardiac catheterization -- unless      other etiology manifests itself.      Lowella Bandy, MD  Electronically Signed     JJC/MEDQ  D:  11/09/2007  T:  11/10/2007  Job:  [id number]

## 2010-10-02 NOTE — Assessment & Plan Note (Signed)
[facility]                          [facility] OFFICE NOTE   NAME:Eddie Reed, Eddie Reed                         MRN:          [id number]  DATE:08/23/2008                            DOB:          August 22, 1950    Eddie Reed has significant coronary artery disease.  He returns for  followup.  The anatomy is outlined in my prior notes.  He is not having  any significant chest pain.  He does continue to smoke.  I did counsel  him concerning this today.  He says that when he tries to stop smoking,  he smokes even more.  He says that he has cut his smoking down to one  pack per day.   He has other reasons to have pain including fibromyalgia and psoriatic  arthritis.   ALLERGIES:  Question PLAVIX, LAMISIL, and LEVAQUIN.   MEDICATIONS:  See the flow sheet.   REVIEW OF SYSTEMS:  He is not having any fever or chills.  He has no  skin rashes.  He has no headaches.  There is no change in his vision or  his hearing.  He has no chest pain or shortness of breath.  His abdomen  is protuberant, but soft.  He has some slight discomfort over his lower  aspect of the sternal scar.  He has no edema by history.   PHYSICAL EXAMINATION:  VITAL SIGNS:  Blood pressure is 145/97 ( He did  not take his medicines yet today ).  GENERAL:  The patient is oriented to person, time, and place.  Affect  reveals that he says that he feels poorly all the time.  He does smell  of cigarette smoke.  HEENT:  No xanthelasma.  He has normal extraocular motion.  NECK:  There are no carotid bruits.  There is no jugular venous  distention.  LUNGS:  Clear.  Respiratory effort is not labored.  CARDIAC:  S1 and S2.  There are no clicks or significant murmurs.  ABDOMEN:  Protuberant, but soft.  CHEST:  The lower aspect of the sternal scar is nicely healed.  EXTREMITIES:  He has no peripheral edema today.   No labs were done today.   PROBLEMS:  Problems are listed on the note of February 01, 2008.  1. Coronary artery disease.  Currently this is stable.  No change in      the approach to this care.  I will see him in followup.     Luis Abed, MD, [facility]  Electronically Signed    JDK/MedQ  DD: 08/23/2008  DT: 08/23/2008  Job #: [id number]   cc:   Kirstie Peri, MD  Dionne Ano. Amanda Pea, M.D.

## 2010-10-02 NOTE — Discharge Summary (Signed)
NAMEVASH, QUEZADA                ACCOUNT NO.:  [id number]   MEDICAL RECORD NO.:  [id number]          PATIENT TYPE:  INP   LOCATION:  2916                         FACILITY:  [facility]   PHYSICIAN:  Joellyn Rued, PA-C     DATE OF BIRTH:  09-07-1950   DATE OF ADMISSION:  11/09/2007  DATE OF DISCHARGE:                         DISCHARGE SUMMARY - REFERRING   NO DICTATION      Joellyn Rued, PA-C     EW/MEDQ  D:  11/11/2007  T:  11/11/2007  Job:  [id number]

## 2010-10-02 NOTE — Assessment & Plan Note (Signed)
[facility]                          [facility] OFFICE NOTE   NAME:Eddie Reed, Eddie Reed                         MRN:          [id number]  DATE:12/03/2007                            DOB:          11-10-1950    PRIMARY CARDIOLOGIST:  Luis Abed, MD, [facility] (new).   REASON FOR VISIT:  Post-Reed followup.   [name] presents to our [facility] for the first time.  He has been  recently hospitalized on several occasions at [facility] in the past few  months, and had prior history of heart disease previously treated at  [facility].  Specifically, he had a history of coronary artery  stenting of both the LAD and RCA in 2003 [facility]).  Unfortunately, he  continued to smoke and presented to [facility] with a NSTEMI in May.  He  was treated emergently with bare-metal stenting of the LAD, followed by  four-vessel CABG (LIMA - LAD; SVG - DX; SVG - OM1; SVG - PL branch), by  Dr. Kathlee Nations Trigt.  Left ventricular function was normal by cardiac  catheterization.   He then returned to [facility] ED on November 09, 2007 and, following  consultation between Dr. Sherryll Burger and Dr. Dietrich Pates at [facility], was  transferred directly for treatment of symptoms worrisome for acute  coronary syndrome.   Cardiac catheterization, by Dr. Shawnie Pons on November 10, 2007, yielded  evidence of occlusion of the SVG - marginal branch graft.  There was  otherwise continued patency of the LIMA - LAD, but with some competitive  filling, the SVG - diagonal, and SVG - distal posterolateral branch  grafts.  It does not appear that an LV gram was repeated.   The patient was discharged on Reed day #2.  He is suggesting to me  that he has not had any of the heartburn symptoms, which prompted this  second hospitalization.  Although, he reports compliance with  medications, he unfortunately continues to smoke.  He does have residual  soreness, which has persisted since his  bypass surgery.  He has not  yet started cardiac rehab.   EKG in our office today indicates NSR at 85 bpm with LAD and ST-segment  coving in the anterior septal leads with a small T-wave inversion in  leads I and aVL.   CURRENT MEDICATIONS:  1. Lisinopril 10 b.i.d.  2. Metoprolol 25 b.i.d.  3. Avodart.  4. Tizanidine.  5. Benazepril 20 b.i.d.  6. Crestor 40 daily.  7. Fexofenadine 180 daily.  8. Aspirin 325 daily.  9. Neurontin 100 daily.   PHYSICAL EXAMINATION:  VITALS SIGNS:  Blood pressure 117/82, pulse 91,  regular, weight 218.  GENERAL:  A 60 year old male, sitting upright, no distress.  HEENT:  Normocephalic, atraumatic.  NECK:  Palpable bilateral carotid pulses without bruits; no JVD.  LUNGS:  Clear to auscultation in all fields.  HEART:  Regular rate and rhythm (S1S2).  No significant murmurs.  No  rubs or gallops.  ABDOMEN:  Soft, nontender, intact bowel sounds.  EXTREMITIES:  Palpable right femoral pulse without bruits;  no ecchymosis  or hematoma.  Intact distal pulses without edema.  NEURO:  No focal deficit.   IMPRESSION:  1. Multivessel CAD.      a.     Status post recent NSTEMI secondary to occlusion of the SVG-       OM graft, with otherwise patent grafts.      b.     Status post acute anterior STEMI, May 2009, initially       treated with emergent bare-metal stenting of the LAD; subsequent       4v CABG, May 2009.      c.     Secondary to late in-stent thrombosis of previously placed       LAD stent.      d.     Preserved LVF.      e.     Status post stenting of LAD and RCA in 2003 [facility]).  2. Plavix allergy.  3. Hypertension.  4. Type 2 diabetes mellitus.  5. Dyslipidemia.  6. Fibromyalgia.  7. Psoriatic arthritis.  8. History of alcohol abuse.  9. Status post right lower extremity cellulitis.   PLAN:  1. Adjust current medication regimen as follows:      a.     Uptitrate Lopressor to 50 q.a.m./25 q.p.m. for suppression       of elevated basal  heart rate.      b.     Add Imdur 30 daily for added antianginal benefit.      c.     The patient appears to be on 2 ACE inhibitors; therefore, we       will discontinue lisinopril and he is to continue on benazepril,       which he apparently was on following his CABG in May.  2. The patient can enroll in cardiac rehab after December 19, 2007; this      will represent more than 1 month since his most recent myocardial      infarction.  3. The patient is strongly advised to stop smoking tobacco.  He had      been given a prescription      for Chantix in the recent past, and I advised him to fill this      prescription.  4. Schedule early clinic follow up with myself and Dr. Myrtis Ser in 2      months.      Rozell Searing, PA-C  Electronically Signed      Luis Abed, MD, [facility]  Electronically Signed   GS/MedQ  DD: 12/03/2007  DT: 12/04/2007  Job #: [id number]   cc:   Kirstie Peri, MD

## 2010-10-02 NOTE — H&P (Signed)
NAMETYRECE, VANTERPOOL                ACCOUNT NO.:  [id number]   MEDICAL RECORD NO.:  [id number]          PATIENT TYPE:  OIB   LOCATION:  2911                         FACILITY:  [facility]   PHYSICIAN:  Veverly Fells. Excell Seltzer, MD  DATE OF BIRTH:  10/17/1950   DATE OF ADMISSION:  10/05/2007  DATE OF DISCHARGE:                              HISTORY & PHYSICAL   CHIEF COMPLAINT:  Chest pain.   HISTORY OF PRESENT ILLNESS:  Mr. Baquero is a 60 year old gentleman with  coronary artery disease.  He has had previous stenting of the right  coronary artery and LAD at [facility] in 2003.  He has not  been followed closely by cardiologist in the interim.  He reports  generally feeling bad over the last 2 weeks.  He has attributed this to  chronic fatigue.  He has been increasingly tired over this 2-week  period.  Today, Mr. Oetken was working in his yard and he developed  severe substernal chest pain.  He described it as a burning sensation  and rates it at an 8-9/10.  It is reminiscent of his previous cardiac  pain, but much more severe.  He has associated dyspnea, but denies  nausea, vomiting, abdominal pain, or diaphoresis.  He has had no  syncope.  He was evaluated in the field by [facility] and was found  to have anterolateral ST elevation and was brought directly to the  cardiac cath lab for emergency catheterization.  At the time of arrival,  he continues to have ongoing chest pain and marked ST elevation on his  anterior telemetry leads.  Of note, he was allergic to Plavix and only  took this for a brief period after his previous stents.   HOME MEDICATIONS:  1. Aspirin 325 mg twice daily.  2. Benazepril 20 mg daily.  3. Allegra 180 mg daily.  4. Avodart 0.5 mg daily.  5. Tizanidine 4 mg twice daily.  6. Xanax 1 mg twice daily.  7. Amlodipine 10 mg daily.  8. Metoprolol 25 mg twice daily.  9. Zantac 150 mg twice daily.   Allergies include Plavix.   The past medical history is  pertinent for coronary artery disease as  outlined.  Previous stenting of the LAD and right coronary artery.  Hypertension, dyslipidemia with intolerance to statins, ongoing tobacco,  fibromyalgia, and chronic fatigue.   PAST SURGERIES:  None.   SOCIAL HISTORY:  The patient is divorced.  He has adult children who  live in the area.  He has worked as a [employer].  He smokes 2 packs  of cigarettes daily, and he drinks approximately 1 pint of liquor per  week with fairly regular alcohol consumption.  He does not exercise.   FAMILY HISTORY:  There is no history of premature CAD in the family.  His father died of myocardial infarction at age 70.  His siblings have  not had coronary disease.   REVIEW OF SYSTEMS:  Complete 12-point review of systems was performed.  Pertinent positives included a BPH, chronic fatigue, and psoriasis.  All  other systems  were reviewed and are negative except as described above.   PHYSICAL EXAMINATION:  GENERAL:  The patient is alert and oriented.  He  is in moderately severe distress secondary to chest pain.  VITAL SIGNS:  Heart rate is 80, blood pressure is 180/120, respiratory  rates 20, and oxygen saturation is 98% on 2 L of oxygen per nasal  cannula.  HEENT:  Normal.  NECK:  Normal carotid upstrokes without bruits.  Jugular venous pressure  is normal.  No thyromegaly or thyroid nodules.  LUNGS:  Clear to auscultation bilaterally.  HEART:  Apex is not palpable.  Heart is regular rate and rhythm with no  murmurs or gallops.  No right ventricular heave or lift.  ABDOMEN:  Soft, obese, and nontender.  No organomegaly.  BACK:  No CVA tenderness.  EXTREMITIES:  No clubbing, cyanosis, or edema.  Femoral pulses are 2+.  Pedal pulses are 2+ and equal.  SKIN:  Warm and dry without rash.  NEUROLOGIC:  Cranial nerves II through XII are intact.  Strength is 5/5  and equal in the arms and legs.   EKG shows sinus rhythm with marked anterolateral injury,  current.   ASSESSMENT:  This is a 60 year old gentleman with an acute anterolateral  myocardial infarction, high probability of stent thrombosis with a  patient with previous left anterior descending stenting, albeit in 2003.  Plan for emergency catheter catheterization.  Risks and indications were  reviewed in detail with the patient.  Further plans pending results of  cardiac catheterization.  We will likely use heparin and Integrilin for  anticoagulation in this gentleman with a Plavix allergy and high  likelihood of stent thrombosis.   For remaining problems, we will continue medical therapy to treat  hypertension.  Likely, we will give another trial of statin for his  dyslipidemia.      Veverly Fells. Excell Seltzer, MD  Electronically Signed     MDC/MEDQ  D:  10/05/2007  T:  10/06/2007  Job:  [id number]

## 2010-10-02 NOTE — Cardiovascular Report (Signed)
NAMEOSCAR, HANK                ACCOUNT NO.:  [id number]   MEDICAL RECORD NO.:  [id number]          PATIENT TYPE:  INP   LOCATION:  2916                         FACILITY:  [facility]   PHYSICIAN:  Arturo Morton. Riley Kill, MD, FACCDATE OF BIRTH:  1950-07-03   DATE OF PROCEDURE:  11/10/2007  DATE OF DISCHARGE:                            CARDIAC CATHETERIZATION   INDICATIONS:  Mr. Nyman is a 60 year old who presents with recurrent  enzyme elevation and chest pain.  He has recently undergone  revascularization surgery.  The current study was done to assess  coronary anatomy.  His enzymes were modestly positive.   PROCEDURE:  1. Selective coronary arteriography.  2. Saphenous vein graft angiography.  3. Selective left internal mammary angiography.   DESCRIPTION OF PROCEDURE:  The patient was brought to the cath lab and  prepped and draped in usual fashion.  Because of 2D echo, we did not do  left heart catheterization.  Through an anterior puncture, the right  femoral artery was easily entered.  A 6-French sheath was then placed.  Following this, views of the left and right coronary arteries were  obtained in multiple angiographic projections.  All 3 vein grafts were  injected.  The internal mammary was entered using an internal mammary  catheter.  Her tolerated the procedure well and there were no  complications.   HEMODYNAMIC DATA:  Central aortic pressure was 106/73, mean 88.   ANGIOGRAPHIC DATA:  1. The left main was free of critical disease.  2. The LAD coursed to the apex.  The LAD has been stented in the acute      setting prior to a surgery.  There is flow down the LAD and what      appears to be competitive flow after a septal perforator.  The      stent has mild luminal irregularities, but is intact.  It is a bare-      metal stent.  There is competitive filling in the distal vessel.      Importantly, injection of the internal mammary reveals an internal      mammary that is  patent.  There is perhaps a 30-40% area of bending      in the vessel, and the distal LAD does not fill through the      internal mammary nor does you see it well from the graft, although      there is clear evidence of the competitive filling of the distal      LAD territory.  The major diagonal branch has 80% narrowing and      then 90% narrowing then bifurcates.  One bifurcation has      substantial disease and the graft appears to enter right just      beyond this or in the other branch where there is evidence of clear-      cut competitive filling.  3. The saphenous vein graft to the diagonal branch is widely patent.      There is slow runoff of the graft, but that is due to size  mismatch.  4. There is a small ramus intermedius that appears to be subtotally      occluded.  There is what appears to be competitive filling distally      and this is likely due to collateralization.  Likewise, the      circumflex is totally occluded and the OM graft is totally      occluded.  5. The right coronary artery has multiple lesions of 50% after a      stent.  There is a 30% lesion and some diffuse luminal      irregularities of 30-40% distally.  There is then an 80% stenosis      after a small PDA and then evidence of competitive filling      distally.  There is a vein graft to the PDA that appears to be      angiographically intact.   CONCLUSION:  1. Continued patency of the left internal mammary to the LAD, but with      evidence of what appears to be some competitive filling.  2. Patent saphenous vein graft to a diagonal that enters at a      bifurcation point.  3. Occluded saphenous vein graft to the distal marginal with at least      collateralization of what is likely the ramus and/or circumflex      itself.  4. Continued patency of the saphenous vein graft to the distal      posterolateral segment.   DISPOSITION:  At the present time, cardiac rehab and medical therapy  will  be recommended.      Arturo Morton. Riley Kill, MD, [facility]  Electronically Signed     TDS/MEDQ  D:  11/10/2007  T:  11/11/2007  Job:  [id number]   cc:   Veverly Fells. Excell Seltzer, MD

## 2010-10-02 NOTE — Discharge Summary (Signed)
Eddie Reed, Eddie Reed                ACCOUNT NO.:  [id number]   MEDICAL RECORD NO.:  [id number]          PATIENT TYPE:  INP   LOCATION:  2018                         FACILITY:  [facility]   PHYSICIAN:  Kerin Perna, M.D.  DATE OF BIRTH:  Oct 16, 1950   DATE OF ADMISSION:  10/05/2007  DATE OF DISCHARGE:  10/14/2007                               DISCHARGE SUMMARY   ADMITTING DIAGNOSES:  1. ST segment elevation myocardial infarction.  2. Multi vessel coronary disease.  3. Status post percutaneous transluminal coronary angioplasty with      stent to the left anterior descending (approximately 6 years ago).  4. History of hypertension.  5. History of dyslipidemia.  6. History of newly diagnosed diabetes mellitus (hemoglobin A1c 7.2).  7. History of benign prostatic hypertrophy.  8. History of EtOH abuse.  9. Status post percutaneous transluminal coronary angioplasty with      bare-metal stent to left anterior descending upon this admission.   DISCHARGE DIAGNOSES:  1. ST segment elevation myocardial infarction.  2. Multi vessel coronary disease.  3. Status post percutaneous transluminal coronary angioplasty with      stent to the left anterior descending (approximately 6 years ago).  4. History of hypertension.  5. History of dyslipidemia.  6. History of newly diagnosed diabetes mellitus (hemoglobin A1c 7.2).  7. History of benign prostatic hypertrophy.  8. History of EtOH abuse.  9. Status post percutaneous transluminal coronary angioplasty with      bare-metal stent to left anterior descending upon this admission.  10.Confusion and hallucination (secondary to history of EtOH abuse).   PROCEDURES:  1. Cardiac catheterization done on Oct 05, 2007.  2. Coronary artery bypass graft x4 (LIMA to LAD, SVG to diagonal, SVG      OM-1, SVG to the posterior lateral branch of the RCA with      endoscopic vein harvesting of the right lower extremity by Dr. Donata Clay on Oct 08, 2007.   HOSPITAL COURSE:  This is a 60 year old Caucasian male with known  hypertension and dyslipidemia who is status post PTCA with stent to the  LAD at [facility] approximately 6 years ago.  According to medical  records, he had progressive fatigue and chest discomfort over the past 2  weeks.  This chest discomfort then became substernal and squeezing like  in nature and was associated with shortness of breath but no syncope,  diaphoresis, nauseousness, or vomiting.  The patient was transported via  EMS to [facility] Room where he was found to have an  anterolateral STEMI.  He was then transferred to [facility] for  urgent cardiac catheterization and further treatment.  The findings of  cardiac catheterization that was performed on Oct 05, 2007, showed a  thrombosis in the LAD stent with an associated chronic occlusion of the  circumflex, a high grade 95% stenosis of the dominant  right coronary  artery with preserved EF of 60%.  It should be noted that the patient  was given Tikosyn 250 mcg prior to his urgent cardiac catheterization  because  he is allergic to Plavix (which causes hives).  He then  underwent angioplasty and subsequent bare metal stenting of the LAD with  re-perfusion of the LAD.  The patient was hemodynamically stable.  He  was on Integrilin and Nitro.  It should be noted that the peak troponin  was greater than 100 and CPK-MB was 270.  He then underwent the  aforementioned aortocoronary bypass surgery by Dr. Morton Peters on Oct 08, 2007.   Postoperatively, the patient remained hemodynamically stable because of  the history of alcohol abuse.  Detox protocol was initiated immediately.  He was also volume overloaded and was diuresed accordingly.  The patient  was found preoperatively to have a hemoglobin A1c elevated at 7.2.  Diabetes education and teaching were arranged both during in the  hospital and need to be arranged as an outpatient as well.  In  addition,  he was placed on a carbohydrate-modified medium-caloric diabetic diet.  His blood sugars were well controlled with insulin.  He was not placed  on oral diabetic medications while in the hospital.  The patient did  have some confusion and hallucination.  He did call 911 from his  hospital room on 2 different occasions.  ET protocol was continued.  In  addition, he was given Ativan 1 mg p.o. q.12 h.  His mental status did  improve.  The aforementioned was most likely secondary to history of  EtOH abuse.  The patient continued to improve.  On postoperative day #6,  which is Oct 14, 2007, the patient was alert and oriented.  He was  afebrile.  His vital signs were stable.  His preoperative weight was 102  kilograms and today's weight is 102.4 kilograms.  CBG was 160 and 123  respectively.  CT of the head that had been obtained previously showed  no acute intracranial abnormality and chronic lacunar infracts.   PHYSICAL EXAMINATION:  CARDIOVASCULAR:  Regular rate and rhythm.  PULMONARY:  Lungs are clear to auscultation bilaterally.  No rales,  wheezes, or rhonchi.  EXTREMITIES:  Positive edema in the right lower extremity as well as  ecchymosis.  Sternal wound and right lower extremity wound are clean,  dry, and continuing to heal.  He is going to be discharged home today.  Prior to his discharge, epicardial pacing wires will be removed as well  as chest tube sutures.  The patient was instructed not to drive or lift  more than a gallon of milk until instructed to do so.  He is to continue  with his breathing exercise daily.  He is to walk every day and increase  the frequency and duration as tolerated.  He is to remain on a  carbohydrate-modified medium-caloric diet.  He was instructed to stop  smoking.  He is on NicoDerm CQ patch.   FOLLOWUP:  His followup visits include the following,  1. Outpatient diabetic teaching and management and establish with the      PCP in 1 week.   2. He is to see [facility] in 2 weeks and needs to call for an      appointment.  3. He is to see Dr. Morton Peters on November 02, 2007.  At that time, a      chest-ray will be obtained.  It should be noted that the patient      was counseled extensively on the importance of glucose control and      management as well as the need to  drastically decrease his alcohol      intake, and the importance of stopping smoking.   DISCHARGE MEDICATIONS:  Include the following,  1. Avita 0.5 mg p.o. daily.  2. Benazepril 10 mg p.o. twice daily.  3. Tizanidine 4 mg p.o. twice daily.  4. Fexofenadine hydrochloride 180 mg p.o. daily.  5. ASA 325 mg p.o. daily.  6. Crestor 40 mg q.i.d..  7. NicoDerm CQ 21 mg/24 hour patch, remove old patch to apply new one.  8. Lopressor 50 mg p.o. twice daily.  9. Alprazolam 1 mg p.o. 2 times daily, p.r.n. anxiety.  10.Ultram 50 mg 1 or 2 tablets p.o. q.6 h. as needed for pain.  11.Lasix 40 mg p.o. daily x3 days.  12.KCl 20 mEq p.o. daily x3 days.  13.Keflex 500 mg p.o. 3 times daily x5 days.   LATEST LABORATORY RESULTS:  Last BMET was on Oct 13, 2005.  Potassium is  4.3.  BUN and creatinine 22 and 1.06 respectively.  Last CBC done today  revealed a white count of 17,300, H&H 13.7 and 39.8 respectively, and  platelet count 355,000.  Last chest x-ray was done on Oct 10, 2007,  which showed atelectasis and some pulmonary vascular congestion.      Doree Fudge, [place]      Kerin Perna, M.D.  Electronically Signed    DZ/MEDQ  D:  10/14/2007  T:  10/15/2007  Job:  [id number]   cc:   [facility]

## 2010-10-02 NOTE — Op Note (Signed)
Eddie Reed, Eddie Reed                ACCOUNT NO.:  [id number]   MEDICAL RECORD NO.:  [id number]          PATIENT TYPE:  INP   LOCATION:  2304                         FACILITY:  [facility]   PHYSICIAN:  Kerin Perna, M.D.  DATE OF BIRTH:  Aug 18, 1950   DATE OF PROCEDURE:  10/08/2007  DATE OF DISCHARGE:                               OPERATIVE REPORT   OPERATION:  1. Coronary artery bypass grafting x4 (left internal mammary artery      left anterior descending, saphenous vein graft to diagonal,      saphenous vein graft to obtuse marginal 1, saphenous vein graft to      posterolateral branch of the right coronary).  2. Endoscopic vein harvest of the right leg greater saphenous vein.   PREOPERATIVE DIAGNOSIS:  Class IV post infarction unstable angina with  severe three-vessel coronary artery disease.   POSTOPERATIVE DIAGNOSIS:  Class IV post infarction unstable angina with  severe three-vessel coronary artery disease.   SURGEON:  Allyn Kenner, MD   ASSISTANT:  Doree Fudge, PA-C   ANESTHESIA:  General.   INDICATIONS:  The patient is a 60 year old white male diabetic smoker  who presented with acute chest pain and EKG changes and was found by  emergency catheterization to have an occluded LAD.  This was at site of  a prior stent placed 5-6 years ago.  This was opened with a angioplasty.  The patient was also noted to have significant disease of the diagonal  circumflex circulation in the distal right coronary circulation.  Based  on these findings and the occlusion of the previously placed stent, (he  is allergic to PLAVIX) it was felt that surgical evaluation would be his  best long-term therapy.   Prior to surgery, I examined Eddie Reed in the CCU room and reviewed  results of the cardiac catheterization with the patient.  I discussed  the indications and expected benefits of coronary artery bypass surgery  for treatment of his coronary artery disease.  I reviewed the  alternatives to surgical therapy as well.  I discussed with him the  major aspects of the procedure including location of the surgical  incisions, the choice of conduit to include internal mammary artery and  endoscopically harvested saphenous vein, use of general anesthesia and  cardiopulmonary bypass, and the expected postoperative hospital  recovery.  I also reviewed with the patient the risks to him of this  operation including risks of MI, CVA, bleeding, blood transfusion  requirement, infection, and death.  Due to his long-standing smoking  history and COPD, he also understood that he was at increased risk for  pulmonary complications including long-standing ventilator dependence  and pneumonia.  After reviewing these issues, he demonstrated his  understanding and agreed to proceed with the surgery under what I felt  was an informed consent.   OPERATIVE FINDINGS:  Vein was harvested entirely from the right leg and  was of good quality.  The mammary artery was a good vessel with  excellent flow.  The patient did not require any packed cell  transfusions for the surgery  but did receive some platelets after  reversal of the heparin with protamine due to long-standing preoperative  use of Ticlid, as well as Integrilin.   PROCEDURE:  The patient was brought to the operating room and placed  supine on the operating table.  General anesthesia was induced.  The  chest, abdomen and legs were prepped with Betadine and draped as a  sterile field.  A sternal incision was made as the saphenous vein was  harvested endoscopically from the right leg.  The internal mammary  artery was harvested as a pedicle graft from its origin at the  subclavian vessels.  Heparin was administered and the ACT was documented  as being therapeutic.  The sternal retractor was placed using the deep  blades.  The pericardium was opened and suspended.  Pursestrings were  placed in the ascending aorta and right atrium.   The patient was then  cannulated and placed on a bypass.  The coronaries were identified for  grafting.  The distal circumflex was too small and diffusely diseased to  graft as a target.  The middle diagonal branch off the LAD trifurcation  was the best target and the diagonal distribution was chosen as the  vessel to graft.  Cardioplegic catheters were placed for both antegrade  and retrograde cold blood cardioplegia and the patient was cooled to 32  degrees.  The aortic cross-clamp was applied and 800 mL of cold blood  cardioplegia was delivered in split doses between the antegrade aortic  and retrograde coronary sinus catheters.  There was good cardioplegic  arrest and septal temperature dropped less than 15 degrees.  Cardioplegia was then delivered every 15-20 minutes while the cross-  clamp was applied.   The distal coronary anastomoses were performed.  The first distal  anastomosis was to the distal posterolateral branch of the right  coronary.  This was a 1.5-mm vessel with proximal 95% stenosis.  A  reverse saphenous vein was sewn end-to-side with running 7-0 Prolene  with good flow through the graft.  The second distal anastomosis was to  the ramus intermediate branch of the left coronary.  This was a 1.5-mm  vessel with proximal 80% stenosis.  A reverse saphenous vein sewn end-to-  side with running 7-0 Prolene with good flow through the graft.  A third  distal anastomosis was in the diagonal branch to LAD.  This was a  trifurcation from the proximal LAD and the mid vessel and this  trifurcation was the best target and was a 1.5-mm vessel.  A reverse  saphenous vein was sewn end-to-side with running 7-0 Prolene with good  flow through the graft.  The fourth distal anastomosis was to the distal  third of the LAD passed areas of intense calcification prior stents.  The left IMA pedicle was brought through an opening in the left lateral  pericardium and was brought down onto the  LAD and sewn end-to-side with  running 8-0 Prolene.  There was good flow through the anastomosis after  briefly releasing the pedicle bulldog on the mammary pedicle.  The  pedicle was secured to the epicardium and the bulldog was applied to the  mammary pedicle.  Cardioplegia was redosed.   While the crossclamp was still in place, three proximal vein anastomoses  were performed on the ascending aorta using a 4.0-mm punch running 6-0  Prolene.  Prior to tying down the final proximal anastomosis, air was  vented from the coronaries with the dose of retrograde warm  blood  cardioplegia.  The final proximal anastomosis was tied and the cross-  clamp was removed.   The heart resumed a spontaneous rhythm.  The cardioplegic catheters were  removed.  Air aspirated from the vein grafts with a 27- gauge needle.  Each graft was checked and was found to have good flow with hemostasis  documented to the proximal and distal anastomosis.  The patient was  rewarmed to 37 degrees.  Temporary pacing wires were applied.  The lungs  were re-expanded.  The ventilator was resumed.  The patient was then  weaned from bypass without difficulty on low-dose dopamine and  milrinone.  Cardiac output was stable at greater than 5 L per minute.  Protamine was administered without adverse reaction.  The cannula was  removed.  The mediastinum was irrigated with warm antibiotic irrigation.  The leg incision was irrigated and closed in a standard fashion.  The  superior pericardial fat was closed over the aorta.  Two mediastinal and  a left pleural chest tube were placed and  brought out through separate incisions.  The sternum was closed with  interrupted steel wire.  The pectoralis fascia was closed with a running  #1 Vicryl.  The subcutaneous and skin layers were closed with a running  Vicryl and sterile dressings were applied.  Total bypass time was 128  minutes with cross-clamp time of 90 minutes.      Kerin Perna, M.D.  Electronically Signed     PV/MEDQ  D:  10/08/2007  T:  10/09/2007  Job:  [id number]   cc:   Bevelyn Buckles. Bensimhon, MD  [facility], [place] [facility]

## 2010-10-02 NOTE — Discharge Summary (Signed)
[name], [name]                ACCOUNT NO.:  [id number]   MEDICAL RECORD NO.:  [id number]          PATIENT TYPE:  INP   LOCATION:  2916                         FACILITY:  [facility]   PHYSICIAN:  Veverly Fells. Excell Seltzer, MD  DATE OF BIRTH:  05-22-1950   DATE OF ADMISSION:  11/09/2007  DATE OF DISCHARGE:  11/11/2007                         DISCHARGE SUMMARY - REFERRING   PRIMARY CARE PHYSICIAN:  It is not clear who his primary care physician  is.   DISCHARGE DIAGNOSES:  1. Non-ST elevated myocardial infarction.  2. Progressive coronary artery disease.  3. Hypertension.  4. Hyperlipidemia.  5. Hyperglycemia with an elevated hemoglobin A1c, ongoing tobacco use,      history as previously noted below.   PROCEDURES:  Cardiac catheterization by Dr. Juanda Chance on November 10, 2007.   BRIEF HISTORY:  [name] is a 60 year old white male who presents  with chest discomfort.  This actually began yesterday which he described  as burning.  However, on the day of admission, it was more significant  did not resolve with nitroglycerin and TUMS.  He feels the discomfort is  somewhat different than his previous myocardial infarction.  However  second troponin was slightly elevated at 0.64.   PAST MEDICAL HISTORY:  1. Heavy alcohol use on weekends.  2. Tobacco use.  3. Known coronary artery disease with ST elevated myocardial      infarction May 2009 with bare metal stent to the LAD, four-vessel      bypass surgery with a LIMA to the LAD, saphenous vein graft to      diagonal, saphenous vein graft to the OM1, saphenous vein graft to      the PL on August 08, 2007, EF of 60%.  4. Hypertension.  5. Newly diagnosed diabetes.  6. Psoriasis with cirrhotic arthritis.  7. Fibromyalgia.   LABORATORY:  Chest x-ray on June 22, showed cardiac enlargement and  probable mild asymmetric interstitial edema, underlying chronic lung  changes/COPD.  EKGs initially showed sinus tachycardia, left axis  deviation, delayed  R-wave with V1-V6 T-wave inversion, nonspecific ST-T  wave changes.  Admission H&H was 13.5 and 41.0, normal indices,  platelets 322, WBCs 11.4, PTT 44, PT 13.8.  Sodium 138, potassium 4.0,  BUN 18, creatinine 0.9, glucose 130.  LFTs were within normal limits  except his AST was slightly elevated at 43.  Initial CK-MB was 129 at  20.9 with a relative index of 16.2 and a troponin of 3.69.  Second  troponin rose to 4.69.  Subsequent CK MBs, relative indexes and  troponins were declining.  Fasting lipids on June 22 showed a total  cholesterol 119.  Triglycerides 155, HDL 31, LDL 57.  Urine drug screen  was positive for opiates, barbiturates and benzodiazepine.  Urine  culture was pending at the time of this dictation.   HOSPITAL COURSE:  [name] was admitted to the hospital by Dr. Nevin Bloodgood  and placed on aspirin, Lovenox, Integrilin given his Plavix allergy.  Overnight, he stated that he felt much better.  However troponins were  suggestive of non-ST elevated myocardial infarction.  Last  catheterization was performed on June 23 by Dr. Riley Kill, showed native  three-vessel coronary artery disease, RIMA to the RCA was patent.  Saphenous vein graft to diagonal was patent.  The LIMA had a 30-40%  proximal lesion with competitive flow.  Dr. Rosalyn Charters note states the  anastomosis was not visualized.  Dr. Riley Kill noted the occluded  saphenous vein graft to the OM with collaterals.  He discontinued  Integrilin and adjusted his medications.  Cardiac rehab assisted with  ambulation and education.  By June 24, the patient was ambulating the  halls without difficulty, and after being seen by Dr. Excell Seltzer, it is felt  that he could be discharged home.   DISPOSITION:  The patient is discharged home, asked to maintain low-  sodium heart-healthy ADA diet.  Wound care per supplemental sheet.  He  was advised no lifting, driving, sexual activity or heavy exertion for  one week.   MEDICATIONS:  1. An  increase of his aspirin to 325 mg.  2. Metoprolol 250 mg b.i.d..  3. Crestor was added to his medical regimen.  4. Continue lisinopril 10 mg b.i.d.  5. Avodart as previously.  6. Tizanidine 4 mg b.i.d. as previously.  7. Nitroglycerin 0.4 as needed.   DISCHARGE INSTRUCTIONS:  He was advised to limit his alcohol to two  beers or less.  He was advised no smoking or tobacco products.  To bring  all medications to all follow-up appointments.  He already has a  scheduled appointment with Dr. Myrtis Ser on December 03, 2007 at 1:00 p.m..  He  was asked to follow up with Dr. Clelia Croft as scheduled.   TIME OF DISCHARGE:  Forty minutes.      Joellyn Rued, PA-C      Veverly Fells. Excell Seltzer, MD  Electronically Signed    EW/MEDQ  D:  11/11/2007  T:  11/11/2007  Job:  [id number]   cc:   Luis Abed, MD, [facility]  Kirstie Peri, MD

## 2010-10-02 NOTE — Assessment & Plan Note (Signed)
[facility]                          [facility] OFFICE NOTE   NAME:Hanger, AJAMU                         MRN:          [id number]  DATE:02/01/2008                            DOB:          01-03-1951    Mr. Thornhill is here for cardiology followup.  He has had several  admissions in 2009 for his cardiac status.  In 2003, he had stents to  the LAD and right coronary artery at [facility].  He came to [facility]  in May.  He was treated emergently with stenting of the LAD followed by  4 vessel CABG with LIMA to the LAD, vein graft to the diagonal vein  graft OM1 and vein graft to posterolateral.  It was felt that he had a  late stent thrombosis causing the event that led to this admission.  He  then went home, but was readmitted to [facility] and underwent cath in November 10, 2007 with evidence of occlusion of the vein graft to the OM.  At  that time, there was patency of the LIMA to the LAD but there was some  competitive filling.  The patient stabilized and went home.   The patient tells me today that he has continued pain in his arm.  There  was question as to how this pain developed.  He seems to remember some  pain that he felt in his arm around that time of an IV was placed.  It  is not clear what the actual event was.  He is having continued problems  and he is following with Dr. Amanda Pea.   The patient is not having any significant ongoing chest pain.   PAST MEDICAL HISTORY:   ALLERGIES:  PLAVIX appears to be listed as an allergy along with LAMISIL  and LEVAQUIN.   MEDICATIONS:  1. Lisinopril.  2. Tizanidine.  3. Benazepril.  4. Crestor.  5. Fexofenadine.  6. Neurontin.  7. Metoprolol.  8. Aspirin.   OTHER MEDICAL PROBLEMS:  See the list below.   REVIEW OF SYSTEMS:  Other than discomfort in his right arm and right  hand, his review of systems is negative.   PHYSICAL EXAMINATION:  VITAL SIGNS:  Blood pressure today is 112/74.  His pulse is  71.  Weight is 225 pounds.  GENERAL:  The patient is oriented to person, time, and place.  Affect is  normal.  HEENT:  No xanthelasma.  He has normal extraocular motion.  NECK: There are no carotid bruits.  There is no jugular venous  distention.  LUNGS:  Clear.  Respiratory effort is not labored.  CARDIAC:  An S1 with an S2.  There are no clicks or significant murmurs.  ABDOMEN:  Soft.  He has no peripheral edema.   No labs were done today.   PROBLEMS:  1. Coronary artery disease.  The patient had old stents to the LAD and      right coronary artery in 2003.  He then presented with an acute      anterior myocardial infarction due to late LAD stent thrombosis.  He had an emergent bare-metal stent placed to the LAD and then      underwent CABG x4 during admission in May 2009.  Later, he returned      and his cath revealed evidence of occlusion of the vein graft to      the marginal.  There appeared to be collateralization possibly from      the ramus and/or the circumflex itself.  There was no intervention.  2. PLAVIX allergy.  3. Hypertension.  4. Diabetes.  5. Dyslipidemia.  6. Fibromyalgia.  7. Psoriatic arthritis.  8. History of alcohol abuse in the past.  9. Status post right lower extremity cellulitis.  10.Continued discomfort in his arm and he is seeing Dr. Amanda Pea.   The patient has pain in his arm.  He has requested Percocet.  We will  give him a limited supply.     Luis Abed, MD, [facility]  Electronically Signed    JDK/MedQ  DD: 02/01/2008  DT: 02/02/2008  Job #: [id number]   cc:   Dionne Ano. Amanda Pea, M.D.  Kirstie Peri, MD

## 2010-10-26 ENCOUNTER — Other Ambulatory Visit: Payer: Self-pay | Admitting: Medical

## 2010-10-31 ENCOUNTER — Encounter: Payer: Self-pay | Admitting: Cardiology

## 2010-11-19 ENCOUNTER — Encounter: Payer: Self-pay | Admitting: Cardiology

## 2010-11-19 DIAGNOSIS — M79603 Pain in arm, unspecified: Secondary | ICD-10-CM | POA: Insufficient documentation

## 2010-11-19 DIAGNOSIS — I2510 Atherosclerotic heart disease of native coronary artery without angina pectoris: Secondary | ICD-10-CM | POA: Insufficient documentation

## 2010-11-19 DIAGNOSIS — M797 Fibromyalgia: Secondary | ICD-10-CM | POA: Insufficient documentation

## 2010-11-19 DIAGNOSIS — R9430 Abnormal result of cardiovascular function study, unspecified: Secondary | ICD-10-CM | POA: Insufficient documentation

## 2010-11-19 DIAGNOSIS — E785 Hyperlipidemia, unspecified: Secondary | ICD-10-CM | POA: Insufficient documentation

## 2010-11-19 DIAGNOSIS — Z720 Tobacco use: Secondary | ICD-10-CM | POA: Insufficient documentation

## 2010-11-19 DIAGNOSIS — Z889 Allergy status to unspecified drugs, medicaments and biological substances status: Secondary | ICD-10-CM | POA: Insufficient documentation

## 2010-11-19 DIAGNOSIS — E119 Type 2 diabetes mellitus without complications: Secondary | ICD-10-CM | POA: Insufficient documentation

## 2010-11-19 DIAGNOSIS — I10 Essential (primary) hypertension: Secondary | ICD-10-CM | POA: Insufficient documentation

## 2010-11-20 ENCOUNTER — Other Ambulatory Visit: Payer: Self-pay | Admitting: Cardiology

## 2010-11-20 ENCOUNTER — Encounter: Payer: Self-pay | Admitting: Cardiology

## 2010-11-20 ENCOUNTER — Ambulatory Visit (INDEPENDENT_AMBULATORY_CARE_PROVIDER_SITE_OTHER): Payer: Medicare Other | Admitting: Cardiology

## 2010-11-20 DIAGNOSIS — Z720 Tobacco use: Secondary | ICD-10-CM

## 2010-11-20 DIAGNOSIS — I2510 Atherosclerotic heart disease of native coronary artery without angina pectoris: Secondary | ICD-10-CM

## 2010-11-20 DIAGNOSIS — I739 Peripheral vascular disease, unspecified: Secondary | ICD-10-CM

## 2010-11-20 DIAGNOSIS — R202 Paresthesia of skin: Secondary | ICD-10-CM

## 2010-11-20 DIAGNOSIS — E785 Hyperlipidemia, unspecified: Secondary | ICD-10-CM

## 2010-11-20 DIAGNOSIS — R209 Unspecified disturbances of skin sensation: Secondary | ICD-10-CM

## 2010-11-20 DIAGNOSIS — F17200 Nicotine dependence, unspecified, uncomplicated: Secondary | ICD-10-CM

## 2010-11-20 DIAGNOSIS — I10 Essential (primary) hypertension: Secondary | ICD-10-CM

## 2010-11-20 NOTE — Patient Instructions (Addendum)
   Bilateral arterial dopplers  Carotid dopplers If the results of your test are normal or stable, you will receive a letter.  If they are abnormal, the nurse will contact you by phone. Follow up - see above for appointment.

## 2010-11-20 NOTE — Assessment & Plan Note (Signed)
Lipids are being treated. No change in therapy. 

## 2010-11-20 NOTE — Progress Notes (Signed)
HPI Patient is seen today for followup of coronary disease.  In addition he is having some tingling in his ears and tingling in his feet.  He did not have any chest pain.  There is no significant shortness of breath.  He does continue to smoke.  I counseled and stop. Allergies  Allergen Reactions  . Lamisil Hives  . Paroxetine Hives    Current Outpatient Prescriptions  Medication Sig Dispense Refill  . ALPRAZolam (XANAX) 1 MG tablet Take 1 mg by mouth 3 (three) times daily as needed.        Marland Kitchen amitriptyline (ELAVIL) 50 MG tablet Take 50 mg by mouth at bedtime.        Marland Kitchen amLODipine (NORVASC) 10 MG tablet Take 10 mg by mouth daily.        Marland Kitchen aspirin 81 MG EC tablet Take 81 mg by mouth daily.        . benazepril (LOTENSIN) 20 MG tablet Take 40 mg by mouth daily.        Marland Kitchen buPROPion (WELLBUTRIN SR) 150 MG 12 hr tablet Take 150 mg by mouth daily.        . CRESTOR 40 MG tablet TAKE ONE TABLET BY MOUTH DAILY EMERGENCY REFILL FAXED DR  30 tablet  3  . etanercept (ENBREL) 50 MG/ML injection Inject 50 mg into the skin once a week.        . fexofenadine (ALLEGRA) 180 MG tablet Take 180 mg by mouth daily.        Marland Kitchen gabapentin (NEURONTIN) 600 MG tablet Take 600 mg by mouth 3 (three) times daily.        Marland Kitchen HYDROcodone-acetaminophen (VICODIN) 5-500 MG per tablet Take 1 tablet by mouth every 6 (six) hours as needed.        . metFORMIN (GLUCOPHAGE) 1000 MG tablet Take 1,000 mg by mouth 2 (two) times daily.        . metoprolol tartrate (LOPRESSOR) 25 MG tablet Take 25 mg by mouth 2 (two) times daily.        Marland Kitchen moxifloxacin (VIGAMOX) 0.5 % ophthalmic solution Place 1 drop into the right eye 4 (four) times daily.        . nitroGLYCERIN (NITROSTAT) 0.4 MG SL tablet Place 0.4 mg under the tongue every 5 (five) minutes as needed.        . Omega-3 Fatty Acids (FISH OIL) 1000 MG CAPS Take 1 capsule by mouth daily.          History   Social History  . Marital Status: Divorced    Spouse Name: N/A    Number of  Children: N/A  . Years of Education: N/A   Occupational History  . Not on file.   Social History Main Topics  . Smoking status: Current Everyday Smoker -- 1.0 packs/day for 45 years    Types: Cigarettes  . Smokeless tobacco: Never Used  . Alcohol Use: No  . Drug Use: Not on file  . Sexually Active: Not on file   Other Topics Concern  . Not on file   Social History Narrative  . No narrative on file    Family History  Problem Relation Age of Onset  . Coronary artery disease      family hx    Past Medical History  Diagnosis Date  . Dyslipidemia   . Hypertension   . CAD (coronary artery disease)     catheterizzation. 11/10/07. patent LIMA to the LAD with competitive filling, patent SVG  to diagonal that enters a bifurcation point, occluded SVG to the distal marginal w/some collateralization, patent SVG to distal posterior lateral. medical therapy recommended.  EF 55% echo. June,2009. hypokinesis of the mid and distal septal wall. technically difficult study.  . S/P CABG x 18 Sep 2007    2009  . Allergy history, drug     plavix  . Diabetes mellitus     type not specified  . Fibromyalgia   . Psoriatic arthritis   . H/O alcohol abuse     Alcohol abuse in the past  . Cellulitis     s/p right lower extremity cellulitis  . Arm pain     continued discomfort in his arm- seeing Dr. Cliffton Asters  . ED (erectile dysfunction)   . Ejection fraction     EF 55%, echo, June, 2009, hypokinesis mid/distal septal wall, technically difficult study  . Tobacco abuse   . Claudication     Possible claudication June, 2012  . Tingling     Ear  tingling June, 201 to    No past surgical history on file.  ROS  Patient denies fever, chills, headache, sweats, rash, change in vision, change in hearing, chest pain, cough, nausea vomiting, urinary symptoms.  All other systems are reviewed and are negative.  PHYSICAL EXAM Patient is stable today.  He smells of cigarette smoke.  Head is atraumatic.   He is oriented to person time and place.  Affect is normal.  There is no jugular venous distention.  Lungs are clear.  Respiratory effort is unlabored.  Cardiac exam reveals S1-S2.  No clicks or significant murmurs.  Abdomen is soft.  There is no peripheral edema.  There are no musculoskeletal deformities.  There no skin rashes.  In the right leg posterior tibial pulses normal.  There is decreased dorsalis pedis pulse.  In the left foot both the posterior tibial and dorsalis pedis pulses are decreased. Filed Vitals:   11/20/10 1053  BP: 114/76  Pulse: 86  Height: 5\' 10"  (1.778 m)  Weight: 204 lb (92.534 kg)  SpO2: 96%    EKG Is not done today.  ASSESSMENT & PLAN

## 2010-11-20 NOTE — Assessment & Plan Note (Signed)
Patient has tingling in his ears.  I believe this is most likely not cardiovascular origin.  There is no documentation of carotid Dopplers done in many years.  With his documented vascular disease it is appropriate to proceed with carotid Dopplers.

## 2010-11-20 NOTE — Assessment & Plan Note (Signed)
Patient has decreased pulses in his feet.  He has some tingling in his feet.  He may have claudication.  Arterial Dopplers are to be done.  The patient Eddie Reed is seen in followup.

## 2010-11-20 NOTE — Assessment & Plan Note (Signed)
Coronary disease is stable. No further workup is needed at this time. 

## 2010-11-20 NOTE — Assessment & Plan Note (Signed)
Blood pressure is controlled. No change in therapy. 

## 2010-11-20 NOTE — Assessment & Plan Note (Signed)
Patient continues to smoke. I counseled him to stop. 

## 2010-11-28 ENCOUNTER — Encounter (INDEPENDENT_AMBULATORY_CARE_PROVIDER_SITE_OTHER): Payer: Medicare Other | Admitting: *Deleted

## 2010-11-28 ENCOUNTER — Other Ambulatory Visit: Payer: Self-pay | Admitting: Cardiology

## 2010-11-28 DIAGNOSIS — I6529 Occlusion and stenosis of unspecified carotid artery: Secondary | ICD-10-CM

## 2010-11-28 DIAGNOSIS — R0989 Other specified symptoms and signs involving the circulatory and respiratory systems: Secondary | ICD-10-CM

## 2010-11-28 DIAGNOSIS — I739 Peripheral vascular disease, unspecified: Secondary | ICD-10-CM

## 2010-12-12 ENCOUNTER — Encounter: Payer: Self-pay | Admitting: Cardiology

## 2011-01-28 ENCOUNTER — Ambulatory Visit: Payer: Medicare Other | Admitting: Cardiology

## 2011-01-29 ENCOUNTER — Encounter: Payer: Self-pay | Admitting: Cardiology

## 2011-01-31 ENCOUNTER — Encounter: Payer: Self-pay | Admitting: Cardiology

## 2011-01-31 DIAGNOSIS — I739 Peripheral vascular disease, unspecified: Secondary | ICD-10-CM

## 2011-02-01 ENCOUNTER — Ambulatory Visit: Payer: Medicare Other | Admitting: Cardiology

## 2011-02-13 LAB — CROSSMATCH
ABO/RH(D): O POS
Antibody Screen: NEGATIVE

## 2011-02-13 LAB — CBC
HCT: 38.4 — ABNORMAL LOW
HCT: 39.1
HCT: 39.2
HCT: 39.8
HCT: 41.1
HCT: 42.5
HCT: 45.2
Hemoglobin: 13.2
Hemoglobin: 13.3
Hemoglobin: 13.5
Hemoglobin: 13.7
Hemoglobin: 14.2
Hemoglobin: 14.4
MCHC: 33.8
MCHC: 33.8
MCHC: 33.9
MCHC: 33.9
MCHC: 34.3
MCHC: 34.5
MCHC: 34.6
MCHC: 35
MCHC: 35.1
MCV: 95.1
MCV: 95.7
MCV: 95.8
MCV: 95.8
MCV: 95.8
MCV: 96.6
MCV: 96.7
Platelets: 166
Platelets: 177
Platelets: 187
Platelets: 196
Platelets: 198
Platelets: 222
Platelets: 223
Platelets: 355
RBC: 4.01 — ABNORMAL LOW
RBC: 4.04 — ABNORMAL LOW
RBC: 4.09 — ABNORMAL LOW
RBC: 4.15 — ABNORMAL LOW
RBC: 4.26
RBC: 4.43
RBC: 4.75
RBC: 5.13
RBC: 5.39
RDW: 13.8
RDW: 13.9
RDW: 14.2
RDW: 14.3
RDW: 14.4
RDW: 14.4
RDW: 15.1
WBC: 15.4 — ABNORMAL HIGH
WBC: 17.3 — ABNORMAL HIGH
WBC: 17.6 — ABNORMAL HIGH
WBC: 20.2 — ABNORMAL HIGH
WBC: 22.1 — ABNORMAL HIGH
WBC: 22.1 — ABNORMAL HIGH
WBC: 22.4 — ABNORMAL HIGH
WBC: 25.5 — ABNORMAL HIGH

## 2011-02-13 LAB — POCT I-STAT 3, ART BLOOD GAS (G3+)
Acid-base deficit: 1
Acid-base deficit: 4 — ABNORMAL HIGH
Bicarbonate: 20.9
O2 Saturation: 100
O2 Saturation: 97
O2 Saturation: 99
Operator id: 262201
Operator id: 284731
Operator id: 300131
Patient temperature: 36.6
Patient temperature: 38.5
TCO2: 25
TCO2: 26
pCO2 arterial: 34.3 — ABNORMAL LOW
pCO2 arterial: 37
pCO2 arterial: 44.5
pH, Arterial: 7.325 — ABNORMAL LOW
pH, Arterial: 7.393
pH, Arterial: 7.509 — ABNORMAL HIGH
pO2, Arterial: 83

## 2011-02-13 LAB — COMPREHENSIVE METABOLIC PANEL
ALT: 33
ALT: 46
AST: 131 — ABNORMAL HIGH
AST: 56 — ABNORMAL HIGH
Albumin: 3.2 — ABNORMAL LOW
Albumin: 3.9
Alkaline Phosphatase: 54
BUN: 11
CO2: 22
CO2: 26
Calcium: 7.9 — ABNORMAL LOW
Calcium: 8.4
Chloride: 99
Creatinine, Ser: 1.13
GFR calc Af Amer: >60
GFR calc Af Amer: >60
GFR calc non Af Amer: >60
GFR calc non Af Amer: >60
Glucose, Bld: 139 — ABNORMAL HIGH
Potassium: 3.7
Sodium: 133 — ABNORMAL LOW
Sodium: 134 — ABNORMAL LOW
Total Bilirubin: 1
Total Protein: 6.3

## 2011-02-13 LAB — BLOOD GAS, ARTERIAL
Acid-Base Excess: 0.8
O2 Saturation: 95.8
TCO2: 25.3

## 2011-02-13 LAB — BASIC METABOLIC PANEL
BUN: 10
BUN: 11
BUN: 22
BUN: 9
CO2: 23
CO2: 25
CO2: 25
CO2: 26
CO2: 30
Calcium: 7.8 — ABNORMAL LOW
Calcium: 8.1 — ABNORMAL LOW
Calcium: 8.7
Calcium: 8.8
Chloride: 102
Chloride: 104
Chloride: 106
Chloride: 98
Creatinine, Ser: 0.9
Creatinine, Ser: 0.99
Creatinine, Ser: 0.99
Creatinine, Ser: 1.06
GFR calc Af Amer: >60
GFR calc Af Amer: >60
GFR calc Af Amer: >60
GFR calc Af Amer: >60
GFR calc non Af Amer: 58 — ABNORMAL LOW
GFR calc non Af Amer: >60
GFR calc non Af Amer: >60
GFR calc non Af Amer: >60
Glucose, Bld: 104 — ABNORMAL HIGH
Glucose, Bld: 122 — ABNORMAL HIGH
Glucose, Bld: 193 — ABNORMAL HIGH
Potassium: 3.9
Potassium: 4.1
Potassium: 4.1
Potassium: 4.3
Sodium: 134 — ABNORMAL LOW
Sodium: 138
Sodium: 138
Sodium: 139

## 2011-02-13 LAB — HEMOGLOBIN A1C
Hgb A1c MFr Bld: 7.4 — ABNORMAL HIGH
Mean Plasma Glucose: 179
Mean Plasma Glucose: 186

## 2011-02-13 LAB — POCT I-STAT 4, (NA,K, GLUC, HGB,HCT)
Glucose, Bld: 128 — ABNORMAL HIGH
Glucose, Bld: 189 — ABNORMAL HIGH
HCT: 34 — ABNORMAL LOW
HCT: 36 — ABNORMAL LOW
HCT: 44
Hemoglobin: 12.2 — ABNORMAL LOW
Hemoglobin: 15
Operator id: 3402
Operator id: 3402
Operator id: 3402
Operator id: 3402
Operator id: 3402
Potassium: 3.8
Potassium: 4.3
Sodium: 135
Sodium: 135
Sodium: 136

## 2011-02-13 LAB — POCT I-STAT, CHEM 8
BUN: 13
Creatinine, Ser: 1
Potassium: 4.1
Sodium: 138

## 2011-02-13 LAB — DIFFERENTIAL
Eosinophils Absolute: 0.3
Eosinophils Relative: 2
Lymphs Abs: 2.4
Monocytes Absolute: 1.3 — ABNORMAL HIGH
Monocytes Relative: 7

## 2011-02-13 LAB — MAGNESIUM
Magnesium: 2.6 — ABNORMAL HIGH
Magnesium: 2.6 — ABNORMAL HIGH
Magnesium: 3 — ABNORMAL HIGH

## 2011-02-13 LAB — PREPARE PLATELET PHERESIS

## 2011-02-13 LAB — CARDIAC PANEL(CRET KIN+CKTOT+MB+TROPI)
CK, MB: 104.2 — ABNORMAL HIGH
CK, MB: 270.6 — ABNORMAL HIGH
CK, MB: 86.6 — ABNORMAL HIGH
Relative Index: 16 — ABNORMAL HIGH
Total CK: 1174 — ABNORMAL HIGH
Total CK: 652 — ABNORMAL HIGH
Total CK: 658 — ABNORMAL HIGH
Troponin I: 56.62
Troponin I: >100

## 2011-02-13 LAB — PLATELET COUNT: Platelets: 136 — ABNORMAL LOW

## 2011-02-13 LAB — LIPID PANEL
Total CHOL/HDL Ratio: 7.7
VLDL: UNDETERMINED

## 2011-02-13 LAB — CREATININE, SERUM
Creatinine, Ser: 0.89
GFR calc Af Amer: >60
GFR calc non Af Amer: >60

## 2011-02-13 LAB — HEMOGLOBIN AND HEMATOCRIT, BLOOD: HCT: 34.4 — ABNORMAL LOW

## 2011-02-13 LAB — APTT
aPTT: 32
aPTT: >200

## 2011-02-13 LAB — PROTIME-INR
INR: 1.2
Prothrombin Time: 14.6
Prothrombin Time: 15.4 — ABNORMAL HIGH

## 2011-02-13 LAB — CK TOTAL AND CKMB (NOT AT ARMC): Total CK: 280 — ABNORMAL HIGH

## 2011-02-13 LAB — ABO/RH: ABO/RH(D): O POS

## 2011-02-14 LAB — CBC
HCT: 38.4 — ABNORMAL LOW
HCT: 42.4
Hemoglobin: 12.9 — ABNORMAL LOW
Hemoglobin: 13.5
Hemoglobin: 14
MCHC: 33
MCV: 92.5
Platelets: 290
RBC: 4.44
RBC: 4.58
WBC: 10
WBC: 11.3 — ABNORMAL HIGH
WBC: 11.4 — ABNORMAL HIGH

## 2011-02-14 LAB — CK TOTAL AND CKMB (NOT AT ARMC)
CK, MB: 11.1 — ABNORMAL HIGH
CK, MB: 17.2 — ABNORMAL HIGH
CK, MB: 20.9 — ABNORMAL HIGH
Relative Index: 14.8 — ABNORMAL HIGH
Relative Index: 16.2 — ABNORMAL HIGH
Total CK: 129

## 2011-02-14 LAB — COMPREHENSIVE METABOLIC PANEL
AST: 43 — ABNORMAL HIGH
BUN: 18
CO2: 25
Chloride: 103
Creatinine, Ser: 0.9
GFR calc Af Amer: >60
GFR calc non Af Amer: >60
Potassium: 4
Sodium: 138
Total Bilirubin: 0.8

## 2011-02-14 LAB — B-NATRIURETIC PEPTIDE (CONVERTED LAB): Pro B Natriuretic peptide (BNP): 112 — ABNORMAL HIGH

## 2011-02-14 LAB — LIPID PANEL
HDL: 31 — ABNORMAL LOW
LDL Cholesterol: 57
Total CHOL/HDL Ratio: 3.8

## 2011-02-14 LAB — PROTIME-INR
INR: 1
Prothrombin Time: 13.8

## 2011-02-14 LAB — URINE CULTURE: Colony Count: 6000

## 2011-02-14 LAB — BASIC METABOLIC PANEL
BUN: 15
CO2: 24
Calcium: 8.9
Creatinine, Ser: 0.97
GFR calc Af Amer: >60
Glucose, Bld: 121 — ABNORMAL HIGH

## 2011-02-14 LAB — HEMOGLOBIN A1C: Mean Plasma Glucose: 179

## 2011-02-14 LAB — CARDIAC PANEL(CRET KIN+CKTOT+MB+TROPI)
Relative Index: INVALID
Troponin I: 1.48

## 2011-02-14 LAB — TROPONIN I
Troponin I: 3.65
Troponin I: 4.69

## 2011-02-14 LAB — RAPID URINE DRUG SCREEN, HOSP PERFORMED
Cocaine: NOT DETECTED
Tetrahydrocannabinol: NOT DETECTED

## 2011-02-14 LAB — APTT: aPTT: 44 — ABNORMAL HIGH

## 2011-02-18 ENCOUNTER — Encounter: Payer: Self-pay | Admitting: Cardiology

## 2011-02-22 ENCOUNTER — Encounter: Payer: Self-pay | Admitting: Cardiology

## 2011-02-22 ENCOUNTER — Ambulatory Visit (INDEPENDENT_AMBULATORY_CARE_PROVIDER_SITE_OTHER): Payer: Medicare Other | Admitting: Cardiology

## 2011-02-22 DIAGNOSIS — I779 Disorder of arteries and arterioles, unspecified: Secondary | ICD-10-CM

## 2011-02-22 DIAGNOSIS — R202 Paresthesia of skin: Secondary | ICD-10-CM

## 2011-02-22 DIAGNOSIS — I2510 Atherosclerotic heart disease of native coronary artery without angina pectoris: Secondary | ICD-10-CM

## 2011-02-22 DIAGNOSIS — Z720 Tobacco use: Secondary | ICD-10-CM

## 2011-02-22 DIAGNOSIS — R209 Unspecified disturbances of skin sensation: Secondary | ICD-10-CM

## 2011-02-22 DIAGNOSIS — F17200 Nicotine dependence, unspecified, uncomplicated: Secondary | ICD-10-CM

## 2011-02-22 DIAGNOSIS — I739 Peripheral vascular disease, unspecified: Secondary | ICD-10-CM | POA: Insufficient documentation

## 2011-02-22 NOTE — Assessment & Plan Note (Signed)
Coronary disease is stable. No change in therapy. 

## 2011-02-22 NOTE — Assessment & Plan Note (Signed)
It appears that the tingling and discomfort in his feet is not vascular in origin.  Most probably is related to his diabetes.

## 2011-02-22 NOTE — Assessment & Plan Note (Signed)
Patient continues to smoke and I have counseled him to stop.

## 2011-02-22 NOTE — Patient Instructions (Signed)
Continue all current medications. Your physician wants you to follow up in:  1 year.  You will receive a reminder letter in the mail one-two months in advance.  If you don't receive a letter, please call our office to schedule the follow up appointment   

## 2011-02-22 NOTE — Assessment & Plan Note (Signed)
His carotid Dopplers revealed bilateral 0-39%.  No further workup is needed.

## 2011-02-22 NOTE — Progress Notes (Signed)
HPI Patient is seen today for followup coronary artery disease.  He is also seen to followup some discomfort in his legs.  I saw him last November 20, 2010.  Decision was made at that time to follow his carotid Dopplers and also to do arterial Dopplers of his legs because of tingling and discomfort in his legs.  The carotid Dopplers revealed mild disease with no major change.  The arterial Dopplers of his legs revealed no significant abnormality.  I discussed this with him.  He's not having any significant chest pain.  He does mention that he has some intermittent tingling in his feet.  He has noted that this may be related to the swings in his glucose level.   Allergies  Allergen Reactions  . Lamisil Hives  . Paroxetine Hives    Current Outpatient Prescriptions  Medication Sig Dispense Refill  . ALPRAZolam (XANAX) 1 MG tablet Take 1 mg by mouth 3 (three) times daily as needed.        Marland Kitchen amLODipine (NORVASC) 10 MG tablet Take 10 mg by mouth daily.        . CRESTOR 40 MG tablet TAKE ONE TABLET BY MOUTH DAILY EMERGENCY REFILL FAXED DR  30 tablet  3  . etanercept (ENBREL) 50 MG/ML injection Inject 50 mg into the skin once a week.        . fexofenadine (ALLEGRA) 180 MG tablet Take 180 mg by mouth daily.        Marland Kitchen HYDROcodone-acetaminophen (VICODIN) 5-500 MG per tablet Take 1 tablet by mouth every 6 (six) hours as needed.        . metFORMIN (GLUCOPHAGE) 1000 MG tablet Take 1,000 mg by mouth 2 (two) times daily.        . metoprolol tartrate (LOPRESSOR) 25 MG tablet Take 25 mg by mouth as needed.       . Omega-3 Fatty Acids (FISH OIL) 1000 MG CAPS Take 1 capsule by mouth daily.        . Sildenafil Citrate (VIAGRA PO) Take by mouth daily.          History   Social History  . Marital Status: Divorced    Spouse Name: N/A    Number of Children: N/A  . Years of Education: N/A   Occupational History  . Not on file.   Social History Main Topics  . Smoking status: Current Everyday Smoker -- 1.0  packs/day for 45 years    Types: Cigarettes  . Smokeless tobacco: Never Used  . Alcohol Use: No  . Drug Use: Not on file  . Sexually Active: Not on file   Other Topics Concern  . Not on file   Social History Narrative  . No narrative on file    Family History  Problem Relation Age of Onset  . Coronary artery disease      family hx    Past Medical History  Diagnosis Date  . Dyslipidemia   . Hypertension   . CAD (coronary artery disease)     catheterizzation. 11/10/07. patent LIMA to the LAD with competitive filling, patent SVG to diagonal that enters a bifurcation point, occluded SVG to the distal marginal w/some collateralization, patent SVG to distal posterior lateral. medical therapy recommended.  EF 55% echo. June,2009. hypokinesis of the mid and distal septal wall. technically difficult study.  . S/P CABG x 18 Sep 2007    2009  . Allergy history, drug     plavix  . Diabetes mellitus  type not specified  . Fibromyalgia   . Psoriatic arthritis   . H/O alcohol abuse     Alcohol abuse in the past  . Cellulitis     s/p right lower extremity cellulitis  . Arm pain     continued discomfort in his arm- seeing Dr. Cliffton Asters  . ED (erectile dysfunction)   . Ejection fraction     EF 55%, echo, June, 2009, hypokinesis mid/distal septal wall, technically difficult study  . Tobacco abuse   . Claudication     Arterial Dopplers, July, 2012, normal with normal ABI  . Tingling     June, 2012 /  carotid Dopplers, in July, 2012, 0-39% bilateral  . Carotid artery disease     Doppler, July, 2012, 0-39% bilateral, followup 2 years    No past surgical history on file.  ROS  Patient denies fever, chills, headache, sweats, rash, change in vision, change in hearing, chest pain, cough, nausea vomiting, urinary symptoms.  All other systems are reviewed and are negative.  PHYSICAL EXAM Patient is stable today.  He is oriented to person time and place.  Affect normal.  He smells of  cigarette use.  Head is atraumatic.  There is no jugular venous distention.  Lungs are clear.  Respiratory effort is nonlabored.  Cardiac exam reveals S1-S2.  There are no clicks or significant murmurs.  Abdomen is soft.  There is no peripheral edema. Filed Vitals:   02/22/11 1100  BP: 115/73  Pulse: 94  Resp: 16  Height: 5\' 10"  (1.778 m)  Weight: 208 lb (94.348 kg)    EKG is done today and reviewed by me.  I have reviewed several old EKGs.He has decreased R wave in V1 to V3.  This has not changed.  This is compatible with old septal MI.  ASSESSMENT & PLAN

## 2011-02-25 ENCOUNTER — Other Ambulatory Visit: Payer: Self-pay | Admitting: Cardiology

## 2011-08-21 ENCOUNTER — Other Ambulatory Visit: Payer: Self-pay | Admitting: *Deleted

## 2011-08-21 MED ORDER — ROSUVASTATIN CALCIUM 40 MG PO TABS: 40.0000 mg | ORAL_TABLET | Freq: Every day | ORAL | Status: DC

## 2011-10-23 ENCOUNTER — Other Ambulatory Visit: Payer: Self-pay | Admitting: Medical

## 2012-06-26 ENCOUNTER — Encounter: Payer: Self-pay | Admitting: Cardiology

## 2012-06-26 ENCOUNTER — Ambulatory Visit (INDEPENDENT_AMBULATORY_CARE_PROVIDER_SITE_OTHER): Payer: Medicare Other | Admitting: Cardiology

## 2012-06-26 VITALS — BP 109/71 | HR 77 | Ht 70.5 in | Wt 206.0 lb

## 2012-06-26 DIAGNOSIS — F17200 Nicotine dependence, unspecified, uncomplicated: Secondary | ICD-10-CM

## 2012-06-26 DIAGNOSIS — I739 Peripheral vascular disease, unspecified: Secondary | ICD-10-CM

## 2012-06-26 DIAGNOSIS — Z888 Allergy status to other drugs, medicaments and biological substances status: Secondary | ICD-10-CM

## 2012-06-26 DIAGNOSIS — I10 Essential (primary) hypertension: Secondary | ICD-10-CM

## 2012-06-26 DIAGNOSIS — Z720 Tobacco use: Secondary | ICD-10-CM

## 2012-06-26 DIAGNOSIS — E785 Hyperlipidemia, unspecified: Secondary | ICD-10-CM

## 2012-06-26 DIAGNOSIS — I2510 Atherosclerotic heart disease of native coronary artery without angina pectoris: Secondary | ICD-10-CM

## 2012-06-26 DIAGNOSIS — Z889 Allergy status to unspecified drugs, medicaments and biological substances status: Secondary | ICD-10-CM

## 2012-06-26 DIAGNOSIS — H9319 Tinnitus, unspecified ear: Secondary | ICD-10-CM

## 2012-06-26 DIAGNOSIS — Z789 Other specified health status: Secondary | ICD-10-CM | POA: Insufficient documentation

## 2012-06-26 DIAGNOSIS — I779 Disorder of arteries and arterioles, unspecified: Secondary | ICD-10-CM

## 2012-06-26 NOTE — Progress Notes (Signed)
Patient ID: Eddie Reed, male   DOB: [birth year], 62 y.o.   MRN: [id number]   HPI   The patient is seen to followup coronary disease. Unfortunately he continues to smoke. We talked about this at great length. He says he has smoked since he was 62 years of age. I reminded him that regardless of this that he would still benefit by stopping. He's not having any chest pain or shortness of breath. He does have tingling in his feet related to his diabetic disease. He also has persistent ringing in his right ear. Unfortunately this persists over time. He has not had any syncope or presyncope.  Allergies  Allergen Reactions  . Paroxetine Hives  . Terbinafine Hcl Hives    Current Outpatient Prescriptions  Medication Sig Dispense Refill  . ALPRAZolam (XANAX) 1 MG tablet Take 1 mg by mouth 3 (three) times daily as needed.        Marland Kitchen amLODipine (NORVASC) 10 MG tablet Take 10 mg by mouth daily.        . benazepril (LOTENSIN) 20 MG tablet Take 20 mg by mouth daily.       . betamethasone dipropionate (DIPROLENE) 0.05 % cream Apply 1 application topically daily.       Marland Kitchen etanercept (ENBREL) 50 MG/ML injection Inject 50 mg into the skin once a week.        . fexofenadine (ALLEGRA) 180 MG tablet Take 180 mg by mouth daily.        . fluticasone (FLONASE) 50 MCG/ACT nasal spray Place 2 sprays into the nose daily.       Marland Kitchen gabapentin (NEURONTIN) 600 MG tablet Take 600 mg by mouth daily.       Marland Kitchen glipiZIDE (GLUCOTROL) 5 MG tablet Take 5 mg by mouth 2 (two) times daily before a meal.       . HYDROcodone-acetaminophen (VICODIN) 5-500 MG per tablet Take 1 tablet by mouth every 6 (six) hours as needed.        . hydrOXYzine (ATARAX/VISTARIL) 25 MG tablet Take 25 mg by mouth every 8 (eight) hours as needed.       . metFORMIN (GLUCOPHAGE) 1000 MG tablet Take 1,000 mg by mouth 2 (two) times daily.        . metoprolol tartrate (LOPRESSOR) 25 MG tablet Take 25 mg by mouth as needed.       . nortriptyline (PAMELOR) 10 MG  capsule Take 10 mg by mouth 2 (two) times daily.       . potassium chloride (K-DUR,KLOR-CON) 10 MEQ tablet Take 10 mEq by mouth 3 (three) times daily.       . predniSONE (DELTASONE) 20 MG tablet Take 20 mg by mouth as needed.       Marland Kitchen PROTOPIC 0.1 % ointment Apply 1 application topically daily.       . rosuvastatin (CRESTOR) 40 MG tablet Take 1 tablet (40 mg total) by mouth daily.  30 tablet  0  . Sildenafil Citrate (VIAGRA PO) Take by mouth daily.        . Tamsulosin HCl (FLOMAX) 0.4 MG CAPS Take 0.4 mg by mouth daily after supper.         History   Social History  . Marital Status: Divorced    Spouse Name: N/A    Number of Children: N/A  . Years of Education: N/A   Occupational History  . Not on file.   Social History Main Topics  . Smoking status: Current Every Day Smoker --  1.0 packs/day for 45 years    Types: Cigarettes  . Smokeless tobacco: Never Used  . Alcohol Use: No  . Drug Use: Not on file  . Sexually Active: Not on file   Other Topics Concern  . Not on file   Social History Narrative  . No narrative on file    Family History  Problem Relation Age of Onset  . Coronary artery disease      family hx    Past Medical History  Diagnosis Date  . Dyslipidemia   . Hypertension   . CAD (coronary artery disease)     catheterizzation. 11/10/07. patent LIMA to the LAD with competitive filling, patent SVG to diagonal that enters a bifurcation point, occluded SVG to the distal marginal w/some collateralization, patent SVG to distal posterior lateral. medical therapy recommended.  EF 55% echo. June,2009. hypokinesis of the mid and distal septal wall. technically difficult study.  . S/P CABG x 18 Sep 2007    2009  . Allergy history, drug     plavix  . Diabetes mellitus     type not specified  . Fibromyalgia   . Psoriatic arthritis   . H/O alcohol abuse     Alcohol abuse in the past  . Cellulitis     s/p right lower extremity cellulitis  . Arm pain     continued  discomfort in his arm- seeing Dr. Cliffton Asters  . ED (erectile dysfunction)   . Ejection fraction     EF 55%, echo, June, 2009, hypokinesis mid/distal septal wall, technically difficult study  . Tobacco abuse   . Claudication     Arterial Dopplers, July, 2012, normal with normal ABI  . Tingling     June, 2012 /  carotid Dopplers, in July, 2012, 0-39% bilateral  . Carotid artery disease     Doppler, July, 2012, 0-39% bilateral, followup 2 years    No past surgical history on file.  Patient Active Problem List  Diagnosis  . Dyslipidemia  . Hypertension  . CAD (coronary artery disease)  . S/P CABG x 1  . Allergy history, drug  . Diabetes mellitus  . Fibromyalgia  . Arm pain  . Ejection fraction  . Tobacco abuse  . Tingling  . Carotid artery disease  . Claudication    ROS   Patient denies fever, chills, headache, sweats, rash, change in vision, change in hearing, chest pain, cough, nausea vomiting, urinary symptoms. All other systems are reviewed and are negative.  PHYSICAL EXAM  Patient smells of cigarettes. He is oriented to person time and place. Affect is normal. There is no jugular venous distention. Lungs are clear. Respiratory effort is nonlabored. Cardiac exam reveals S1 and S2. There no clicks or significant murmurs. The abdomen is soft. There is no peripheral edema. There are no musculoskeletal deformities. There are no skin rashes.  Filed Vitals:   06/26/12 1403  BP: 109/71  Pulse: 77  Height: 5' 10.5" (1.791 m)  Weight: 206 lb (93.441 kg)   EKG is done today and reviewed by me. There is sinus rhythm. There is decreased anterior R wave progression. This is unchanged from the past. There is also decreased R wave in the inferior leads. There is no significant change from the past.  ASSESSMENT & PLAN

## 2012-06-26 NOTE — Assessment & Plan Note (Signed)
Patient has significant discomfort in his feet. However his arterial Dopplers do not show any marked abnormalities.

## 2012-06-26 NOTE — Patient Instructions (Addendum)

## 2012-06-26 NOTE — Assessment & Plan Note (Signed)
The patient carries a history of being allergic to Plavix. He also is intolerant to aspirin.

## 2012-06-26 NOTE — Assessment & Plan Note (Signed)
Blood pressure is controlled. No change in therapy. 

## 2012-06-26 NOTE — Assessment & Plan Note (Signed)
Patient has mild carotid disease. He needs a followup in one year.

## 2012-06-26 NOTE — Assessment & Plan Note (Signed)
Patient has significant coronary disease post CABG in the past. His last cath was 2009. There was one occluded vein graft with some collateralization. Medical therapy was recommended. He does not need any further testing at this time.

## 2012-06-26 NOTE — Assessment & Plan Note (Signed)
He has persistent ringing in his right ear. This is not cardiac in origin.

## 2012-06-26 NOTE — Assessment & Plan Note (Signed)
The patient is on Crestor. He asked me my recommendation and I made it quite clear to him that I was strongly in favor of discontinuing this medication.

## 2012-06-26 NOTE — Assessment & Plan Note (Signed)
Unfortunately he continues to smoke. We had a long discussion about this and I urged him to try to cut back or stop.

## 2012-12-03 ENCOUNTER — Encounter: Payer: Self-pay | Admitting: Cardiology

## 2012-12-03 ENCOUNTER — Encounter (INDEPENDENT_AMBULATORY_CARE_PROVIDER_SITE_OTHER): Payer: Medicare Other

## 2012-12-03 DIAGNOSIS — I6529 Occlusion and stenosis of unspecified carotid artery: Secondary | ICD-10-CM

## 2012-12-08 ENCOUNTER — Encounter: Payer: Self-pay | Admitting: Cardiology

## 2013-03-25 ENCOUNTER — Other Ambulatory Visit: Payer: Self-pay

## 2013-10-06 ENCOUNTER — Ambulatory Visit (INDEPENDENT_AMBULATORY_CARE_PROVIDER_SITE_OTHER): Payer: Medicare Other | Admitting: Neurology

## 2013-10-06 ENCOUNTER — Encounter: Payer: Self-pay | Admitting: Neurology

## 2013-10-06 VITALS — BP 114/71 | HR 86 | Ht 69.5 in | Wt 209.0 lb

## 2013-10-06 DIAGNOSIS — E1142 Type 2 diabetes mellitus with diabetic polyneuropathy: Secondary | ICD-10-CM

## 2013-10-06 DIAGNOSIS — D518 Other vitamin B12 deficiency anemias: Secondary | ICD-10-CM

## 2013-10-06 HISTORY — DX: Type 2 diabetes mellitus with diabetic polyneuropathy: E11.42

## 2013-10-06 MED ORDER — DULOXETINE HCL 30 MG PO CPEP: ORAL_CAPSULE | ORAL | Status: DC

## 2013-10-06 NOTE — Progress Notes (Signed)
Reason for visit: Peripheral neuropathy  Eddie Reed Sr. is a 63 y.o. male  History of present illness:  Eddie Reed is a 63 year old gentleman with a history of diabetes. The patient has had severe discomfort in the feet that has been present for about 2 years. The patient denies any significant issues with back pain or pain down the leg, and he denies neck pain or pain down arms. He denies problems controlling the bowels or the bladder. He has degenerative arthritis, and he has on occasion requires injections in the knees. The patient indicates that he feels better when he is up on his feet, worse when he is resting or trying to get to sleep. He is on gabapentin he believes taking 800 mg 3 times daily, but this medication is not effective in fully controlling the pain. He comes to this office today for an evaluation. He has had diabetes over the last 7-8 years.  Past Medical History  Diagnosis Date  . Dyslipidemia   . Hypertension   . CAD (coronary artery disease)     catheterizzation. 11/10/07. patent LIMA to the LAD with competitive filling, patent SVG to diagonal that enters a bifurcation point, occluded SVG to the distal marginal w/some collateralization, patent SVG to distal posterior lateral. medical therapy recommended.  EF 55% echo. June,2009. hypokinesis of the mid and distal septal wall. technically difficult study.  . S/P CABG x 18 Sep 2007    2009  . Allergy history, drug     plavix  . Diabetes mellitus     type not specified  . Fibromyalgia   . Psoriatic arthritis   . H/O alcohol abuse     Alcohol abuse in the past  . Cellulitis     s/p right lower extremity cellulitis  . Arm pain     continued discomfort in his arm- seeing Dr. Annie Main  . ED (erectile dysfunction)   . Ejection fraction     EF 55%, echo, June, 2009, hypokinesis mid/distal septal wall, technically difficult study  . Tobacco abuse   . Claudication     Arterial Dopplers, July, 2012, normal with  normal ABI  . Ringing in ear     June, 2012 /  carotid Dopplers, in July, 2012, 0-39% bilateral  . Carotid artery disease     Doppler, July, 2012, 0-39% bilateral, followup 2 years  . Drug intolerance     Patient is intolerant to both aspirin and Plavix.  . Glaucoma     right eye  . Polyneuropathy in diabetes(357.2) 10/06/2013    Past Surgical History  Procedure Laterality Date  . Coronary artery bypass graft    . Cataract extraction Right     Family History  Problem Relation Age of Onset  . Coronary artery disease      family hx  . Dementia Mother   . Diabetes Mother   . Coronary artery disease Father   . Heart attack Father   . Cancer Brother     Social history:  reports that he has been smoking Cigarettes.  He has a 45 pack-year smoking history. He has never used smokeless tobacco. He reports that he drinks alcohol. He reports that he does not use illicit drugs.  Medications:  Current Outpatient Prescriptions on File Prior to Visit  Medication Sig Dispense Refill  . ALPRAZolam (XANAX) 1 MG tablet Take 1 mg by mouth 3 (three) times daily as needed.        Marland Kitchen amLODipine (NORVASC)  10 MG tablet Take 10 mg by mouth daily.        . benazepril (LOTENSIN) 20 MG tablet Take 20 mg by mouth daily.       . betamethasone dipropionate (DIPROLENE) 0.05 % cream Apply 1 application topically daily.       Marland Kitchen etanercept (ENBREL) 50 MG/ML injection Inject 50 mg into the skin once a week.        . fexofenadine (ALLEGRA) 180 MG tablet Take 180 mg by mouth daily.        . fluticasone (FLONASE) 50 MCG/ACT nasal spray Place 2 sprays into the nose daily.       Marland Kitchen glipiZIDE (GLUCOTROL) 5 MG tablet Take 5 mg by mouth 2 (two) times daily before a meal.       . HYDROcodone-acetaminophen (VICODIN) 5-500 MG per tablet Take 1 tablet by mouth every 6 (six) hours as needed.        . hydrOXYzine (ATARAX/VISTARIL) 25 MG tablet Take 25 mg by mouth every 8 (eight) hours as needed.       . metFORMIN (GLUCOPHAGE)  1000 MG tablet Take 1,000 mg by mouth 2 (two) times daily.        . metoprolol tartrate (LOPRESSOR) 25 MG tablet Take 25 mg by mouth as needed.       . potassium chloride (K-DUR,KLOR-CON) 10 MEQ tablet Take 10 mEq by mouth 3 (three) times daily.       . predniSONE (DELTASONE) 20 MG tablet Take 20 mg by mouth as needed.       Marland Kitchen PROTOPIC 0.1 % ointment Apply 1 application topically daily.       . rosuvastatin (CRESTOR) 40 MG tablet Take 1 tablet (40 mg total) by mouth daily.  30 tablet  0  . Sildenafil Citrate (VIAGRA PO) Take by mouth daily.        . Tamsulosin HCl (FLOMAX) 0.4 MG CAPS Take 0.4 mg by mouth daily after supper.        No current facility-administered medications on file prior to visit.      Allergies  Allergen Reactions  . Paroxetine Hives  . Terbinafine Hcl Hives    ROS:  Out of a complete 14 system review of symptoms, the patient complains only of the following symptoms, and all other reviewed systems are negative.  Fatigue Ringing in the ears Itching Blurred vision Shortness of breath Urination problems Jaw pain, achy muscles Runny nose Confusion, numbness Anxiety Insomnia, restless legs  Blood pressure 114/71, pulse 86, height 5' 9.5" (1.765 m), weight 209 lb (94.802 kg).  Physical Exam  General: The patient is alert and cooperative at the time of the examination.  Eyes: Pupils are equal, round, and reactive to light. Discs are flat bilaterally.  Neck: The neck is supple, no carotid bruits are noted.  Respiratory: The respiratory examination is clear.  Cardiovascular: The cardiovascular examination reveals a regular rate and rhythm, no obvious murmurs or rubs are noted.  Skin: Extremities are without significant edema.  Neurologic Exam  Mental status: The patient is alert and oriented x 3 at the time of the examination. The patient has apparent normal recent and remote memory, with an apparently normal attention span and concentration ability.  Mini-Mental status examination done today shows a total score of 29/30.  Cranial nerves: Facial symmetry is present. There is good sensation of the face to pinprick and soft touch bilaterally. The strength of the facial muscles and the muscles to head turning and shoulder  shrug are normal bilaterally. Speech is well enunciated, no aphasia or dysarthria is noted. Extraocular movements are full. Visual fields are full. The tongue is midline, and the patient has symmetric elevation of the soft palate. No obvious hearing deficits are noted.  Motor: The motor testing reveals 5 over 5 strength of all 4 extremities. Good symmetric motor tone is noted throughout.  Sensory: Sensory testing is intact to pinprick, soft touch, vibration sensation, and position sense on the upper extremities. With the lower extremities, the patient has a stocking pattern pinprick sensory deficit two thirds the way up the legs below the knees. Vibration sensation and position sensation are moderately impaired in the feet. No evidence of extinction is noted.  Coordination: Cerebellar testing reveals good finger-nose-finger and heel-to-shin bilaterally.  Gait and station: Gait is normal. Tandem gait is normal. Romberg is negative. No drift is seen.  Reflexes: Deep tendon reflexes are symmetric, but are depressed bilaterally. Toes are downgoing bilaterally.   Assessment/Plan:  1. Diabetes  2. Peripheral neuropathy  The patient will be given a trial on Cymbalta at this time. He will continue the gabapentin. Nerve conduction studies will be done, and this will include both legs and one arm. Blood work will be done today looking for other etiologies of peripheral neuropathies. The patient will followup in about 4 months.  Jill Alexanders MD 10/06/2013 7:48 PM  [facility] [address] [place], [place] [address]  Phone [phone] Fax [phone]

## 2013-10-06 NOTE — Patient Instructions (Signed)

## 2013-10-13 LAB — IFE AND PE, SERUM: TOTAL PROTEIN: 7 g/dL (ref 6.0–8.5)

## 2013-10-13 LAB — VITAMIN B12: VITAMIN B 12: 324 pg/mL (ref 211–946)

## 2013-10-13 LAB — RHEUMATOID FACTOR

## 2013-10-13 LAB — ANA W/REFLEX: Anti Nuclear Antibody(ANA): NEGATIVE

## 2013-10-13 LAB — ANGIOTENSIN CONVERTING ENZYME: Angio Convert Enzyme: 33 U/L (ref 14–82)

## 2013-10-13 NOTE — Progress Notes (Signed)
Quick Note:  Called patient to share lab results per Dr Jannifer Franklin, he verbalized understanding and requesting to reschedule precedure(NCV). ______

## 2013-10-14 ENCOUNTER — Telehealth: Payer: Self-pay | Admitting: Neurology

## 2013-11-03 ENCOUNTER — Telehealth: Payer: Self-pay | Admitting: Neurology

## 2013-11-03 MED ORDER — NORTRIPTYLINE HCL 10 MG PO CAPS: ORAL_CAPSULE | ORAL | Status: DC

## 2013-11-03 NOTE — Telephone Encounter (Signed)
I called the patient, he is to stop the Cymbalta. We'll try low-dose nortriptyline. He has issues with this, we may try Topamax instead. He should continue the gabapentin.

## 2013-11-03 NOTE — Telephone Encounter (Signed)
I called the patient back.  Said he started taking Cymbalta, then felt side effects so he stopped it for several days, then started again and the s/e returned so he discontinued the med.  He started the med again and has been taking one capsule daily for 5 days.  Says since he resumed Cymbalta, he has headaches, ringing in the ears, dizziness, and at times feels he had an escalated heart rate and SOB.  Thinks it could be helping his neuropathy, but is not sure.  His symptoms did subside when med was d/c.  He would like a call back from Patrycja Mumpower to discuss options.  Please advise.  Thank you.

## 2013-11-03 NOTE — Telephone Encounter (Signed)
Pt called states he would like for Dr. Jannifer Franklin to return his call concerning his medication DULoxetine (CYMBALTA) 30 MG capsule, pt states he is having several problems and he feels like this medication may be doing it.  Shortness of breathe, balance is off, increased ringing in ear, dizziness, fast or irregular heartbeat, headaches that he has never had. Pt states the medication may be helping with his neuropathy for his feet. Please call pt concerning this matter. Thanks

## 2013-12-07 ENCOUNTER — Telehealth: Payer: Self-pay | Admitting: Neurology

## 2013-12-07 ENCOUNTER — Ambulatory Visit (INDEPENDENT_AMBULATORY_CARE_PROVIDER_SITE_OTHER): Payer: Medicare Other

## 2013-12-07 DIAGNOSIS — G5601 Carpal tunnel syndrome, right upper limb: Secondary | ICD-10-CM

## 2013-12-07 DIAGNOSIS — E1142 Type 2 diabetes mellitus with diabetic polyneuropathy: Secondary | ICD-10-CM

## 2013-12-07 NOTE — Telephone Encounter (Signed)
The patient called back, we discussed the nerve conduction study. The patient wishes to try a topical ointment for his peripheral neuropathy pain. I'll try to get this set up.

## 2013-12-07 NOTE — Telephone Encounter (Signed)
I called patient. Nerve conduction studies done today show no clear evidence of a peripheral neuropathy, but the patient could have a small fiber neuropathy associated with the diabetes. He does have a mild to moderate right carpal tunnel syndrome. If needed, this can be treated.

## 2013-12-07 NOTE — Procedures (Signed)
     HISTORY:  Eddie Reed is a 63 year old gentleman with a history of diabetes who reports a two-year history of discomfort in the feet. He denies any significant back pain or pain radiating down the legs. He is being evaluated for possible diabetic peripheral neuropathy.  NERVE CONDUCTION STUDIES:  Nerve conduction studies were performed on the right upper extremity. The distal motor latency for the right median nerve was prolonged, with a low motor amplitude. The distal motor latency and motor amplitudes for the right ulnar nerve was normal. The F wave latencies and nerve conduction velocities for the right median and ulnar nerves were normal, with prolongation of the sensory latency for the right median nerve. The right ulnar sensory latency was normal.  Nerve conduction studies were performed on both lower extremities. The distal motor latencies and motor amplitudes for the peroneal and posterior tibial nerves were within normal limits. The nerve conduction velocities for these nerves were also normal. The H reflex latencies were normal. The sensory latencies for the peroneal nerves were within normal limits.   EMG STUDIES:  EMG evaluation was not performed.  IMPRESSION:  Nerve conduction studies done on the right upper extremity and on both lower extremities does not show clear evidence of a peripheral neuropathy. A small fiber neuropathy may be missed on standard nerve conduction studies, however. Clinical correlation is required. There is evidence of a mild to moderate right carpal tunnel syndrome on this evaluation.  Jill Alexanders MD 12/07/2013 12:00 PM  [facility] [address] [place], [place] [address]  Phone [phone] Fax [phone]

## 2013-12-13 ENCOUNTER — Telehealth: Payer: Self-pay | Admitting: Neurology

## 2013-12-13 NOTE — Telephone Encounter (Signed)
I contacted the alternate [facility] Pharm[facility], [facility], they asked me to fax the Rx to them and they will gladly try to fill it.  They do not anticipate any issues, however, they will contact us if they are not able to fill this Rx.  I called the patient back.  He is aware and was agreeable to this.

## 2013-12-13 NOTE — Telephone Encounter (Signed)
Pt stated insurance will not pay for topical ointment per( 7/21 telephone call), due to the company that could provide medication is out of state.  Questioning alternative medication.  Please call and advise

## 2013-12-21 ENCOUNTER — Telehealth: Payer: Self-pay | Admitting: Neurology

## 2013-12-21 MED ORDER — LIDOCAINE 5 % EX PTCH: 1.0000 | MEDICATED_PATCH | CUTANEOUS | Status: DC

## 2013-12-21 NOTE — Telephone Encounter (Signed)
I called the pharmacy at [phone].  Spoke with Waunita Schooner who transferred me to [facility].  She said the patient's insurance "excludes" compounds from the policy, and therefore will not pay for the Rx.  They offered to fill 90 grams for the patient for $99, however the patient declined.  Since ins does not cover compounds, is there something else you would like to prescribe?  Please advise.  Thank you.

## 2013-12-21 NOTE — Telephone Encounter (Signed)
Patient calling to state that the second pharmacy has called him regarding his compound medication, states that his insurance rejected it. Please call and advise.

## 2013-12-21 NOTE — Telephone Encounter (Signed)
I called patient. The patient could not obtain the topical ointment, insurance has not covered. He wants to try the Lidoderm patch, I'll try this for him.

## 2013-12-21 NOTE — Telephone Encounter (Signed)
The patient has Medicare/Medicaid  Medicare [id number] A Medicare [id number] [facility] Access

## 2013-12-23 ENCOUNTER — Telehealth: Payer: Self-pay

## 2013-12-23 NOTE — Telephone Encounter (Signed)
[facility] notified us they have approved our request for coverage on Lidocaine Patches effective until 12/23/2014 Ref # [id number]

## 2014-01-07 ENCOUNTER — Telehealth: Payer: Self-pay | Admitting: Neurology

## 2014-01-07 NOTE — Telephone Encounter (Signed)
Rollene Fare from [facility] calling to request patient's nerve conduction and EMG results to be faxed over to her, [phone].

## 2014-02-08 ENCOUNTER — Ambulatory Visit: Payer: Medicare Other | Admitting: Adult Health

## 2014-03-31 ENCOUNTER — Ambulatory Visit: Payer: Medicare Other | Admitting: Adult Health

## 2014-04-06 ENCOUNTER — Ambulatory Visit: Payer: Medicare Other | Admitting: Adult Health

## 2014-04-18 ENCOUNTER — Other Ambulatory Visit: Payer: Self-pay | Admitting: Neurology

## 2014-05-23 ENCOUNTER — Ambulatory Visit (INDEPENDENT_AMBULATORY_CARE_PROVIDER_SITE_OTHER): Payer: Medicare Other | Admitting: Neurology

## 2014-05-23 ENCOUNTER — Encounter: Payer: Self-pay | Admitting: Neurology

## 2014-05-23 VITALS — BP 124/75 | HR 65 | Ht 70.0 in | Wt 209.6 lb

## 2014-05-23 DIAGNOSIS — E1142 Type 2 diabetes mellitus with diabetic polyneuropathy: Secondary | ICD-10-CM

## 2014-05-23 MED ORDER — CARBAMAZEPINE 200 MG PO TABS: ORAL_TABLET | ORAL | Status: DC

## 2014-05-23 MED ORDER — LIDOCAINE 5 % EX OINT: 1.0000 "application " | TOPICAL_OINTMENT | Freq: Three times a day (TID) | CUTANEOUS | Status: DC | PRN

## 2014-05-23 NOTE — Progress Notes (Signed)
Reason for visit: Peripheral neuropathy  Eddie FILDES Sr. is an 64 y.o. male  History of present illness:  Eddie Reed is a 64 year old white male with a history of diabetes, and a diabetic peripheral neuropathy. The patient has had increasing problems with lancinating pains in the feet. Nerve conduction studies done previously were unremarkable, and the patient is felt to have a small fiber neuropathy. He denies back pain, and indicates that the pain is worse when he is inactive, and when he is standing still. He does not feel as much pain when he is actively walking. He does have some mild balance problems, he has not had any falls. He is on gabapentin taking 800 mg 3 times daily. He could not tolerate Cymbalta, nortriptyline, and a prescription for a compounded topical ointment was not covered by his insurance. He has used Lidoderm patches with minimal benefit. The patient will wake up frequently at night with the pain. He comes to this office for an evaluation.  Past Medical History  Diagnosis Date  . Dyslipidemia   . Hypertension   . CAD (coronary artery disease)     catheterizzation. 11/10/07. patent LIMA to the LAD with competitive filling, patent SVG to diagonal that enters a bifurcation point, occluded SVG to the distal marginal w/some collateralization, patent SVG to distal posterior lateral. medical therapy recommended.  EF 55% echo. June,2009. hypokinesis of the mid and distal septal wall. technically difficult study.  . S/P CABG x 18 Sep 2007    2009  . Allergy history, drug     plavix  . Diabetes mellitus     type not specified  . Fibromyalgia   . Psoriatic arthritis   . H/O alcohol abuse     Alcohol abuse in the past  . Cellulitis     s/p right lower extremity cellulitis  . Arm pain     continued discomfort in his arm- seeing Dr. Annie Main  . ED (erectile dysfunction)   . Ejection fraction     EF 55%, echo, June, 2009, hypokinesis mid/distal septal wall, technically  difficult study  . Tobacco abuse   . Claudication     Arterial Dopplers, July, 2012, normal with normal ABI  . Ringing in ear     June, 2012 /  carotid Dopplers, in July, 2012, 0-39% bilateral  . Carotid artery disease     Doppler, July, 2012, 0-39% bilateral, followup 2 years  . Drug intolerance     Patient is intolerant to both aspirin and Plavix.  . Glaucoma     right eye  . Polyneuropathy in diabetes(357.2) 10/06/2013    Past Surgical History  Procedure Laterality Date  . Coronary artery bypass graft    . Cataract extraction Right     Family History  Problem Relation Age of Onset  . Coronary artery disease      family hx  . Dementia Mother   . Diabetes Mother   . Coronary artery disease Father   . Heart attack Father   . Cancer Brother     Social history:  reports that he has been smoking Cigarettes.  He has a 45 pack-year smoking history. He has never used smokeless tobacco. He reports that he drinks alcohol. He reports that he does not use illicit drugs.    Allergies  Allergen Reactions  . Cymbalta [Duloxetine Hcl]     Headache, shortness of breath  . Paroxetine Hives  . Terbinafine Hcl Hives    Medications:  Current Outpatient Prescriptions on File Prior to Visit  Medication Sig Dispense Refill  . ALPRAZolam (XANAX) 1 MG tablet Take 1 mg by mouth 3 (three) times daily as needed.      Marland Kitchen amLODipine (NORVASC) 10 MG tablet Take 10 mg by mouth daily.      . benazepril (LOTENSIN) 20 MG tablet Take 20 mg by mouth daily.     . betamethasone dipropionate (DIPROLENE) 0.05 % cream Apply 1 application topically daily.     Marland Kitchen etanercept (ENBREL) 50 MG/ML injection Inject 50 mg into the skin once a week.      . fexofenadine (ALLEGRA) 180 MG tablet Take 180 mg by mouth daily.      . fluticasone (FLONASE) 50 MCG/ACT nasal spray Place 2 sprays into the nose daily.     Marland Kitchen gabapentin (NEURONTIN) 800 MG tablet Take 800 mg by mouth 3 (three) times daily.    Marland Kitchen glipiZIDE  (GLUCOTROL) 5 MG tablet Take 5 mg by mouth 2 (two) times daily before a meal.     . HYDROcodone-acetaminophen (VICODIN) 5-500 MG per tablet Take 1 tablet by mouth every 6 (six) hours as needed.      . hydrOXYzine (ATARAX/VISTARIL) 25 MG tablet Take 25 mg by mouth every 8 (eight) hours as needed.     . metFORMIN (GLUCOPHAGE) 1000 MG tablet Take 1,000 mg by mouth 2 (two) times daily.      . metoprolol tartrate (LOPRESSOR) 25 MG tablet Take 25 mg by mouth as needed.     . potassium chloride (K-DUR,KLOR-CON) 10 MEQ tablet Take 10 mEq by mouth 3 (three) times daily.     . predniSONE (DELTASONE) 20 MG tablet Take 20 mg by mouth as needed.     Marland Kitchen PROTOPIC 0.1 % ointment Apply 1 application topically daily.     . rosuvastatin (CRESTOR) 40 MG tablet Take 1 tablet (40 mg total) by mouth daily. 30 tablet 0  . Sildenafil Citrate (VIAGRA PO) Take by mouth daily.      . Tamsulosin HCl (FLOMAX) 0.4 MG CAPS Take 0.4 mg by mouth daily after supper.      No current facility-administered medications on file prior to visit.    ROS:  Out of a complete 14 system review of symptoms, the patient complains only of the following symptoms, and all other reviewed systems are negative.  Activity change, fatigue Ear pain, ringing in the ears Eyes itching Excessive thirst Restless legs, insomnia, frequent waking, daytime sleepiness Difficulty urinating, painful urination, frequency of urination Joint pain, achy muscles, walking difficulty Skin rash, itching Dizziness, numbness, weakness Depression, anxiety  Blood pressure 124/75, pulse 65, height 5\' 10"  (1.778 m), weight 209 lb 9.6 oz (95.074 kg).  Physical Exam  General: The patient is alert and cooperative at the time of the examination.  Skin: No significant peripheral edema is noted.   Neurologic Exam  Mental status: The patient is oriented x 3.  Cranial nerves: Facial symmetry is present. Speech is normal, no aphasia or dysarthria is noted.  Extraocular movements are full. Visual fields are full.  Motor: The patient has good strength in all 4 extremities.  Sensory examination: Soft touch sensation is symmetric on the face, arms, and legs.  Coordination: The patient has good finger-nose-finger and heel-to-shin bilaterally.  Gait and station: The patient has a normal gait. Tandem gait is slightly unsteady. Romberg is negative. No drift is seen.  Reflexes: Deep tendon reflexes are symmetric, but are depressed.   Assessment/Plan:  1.  Diabetes  2. Diabetic peripheral neuropathy, likely small fiber neuropathy  The patient is having a lot of discomfort with the neuropathy, and he is having lancinating pains. I will try carbamazepine for the discomfort. He will continue the gabapentin for now. A prescription was given for the lidocaine gel. He will follow-up in 4-6 months.  Jill Alexanders MD 05/23/2014 8:08 PM  [facility] [address] [place], [place] [address]  Phone [phone] Fax [phone]

## 2014-05-23 NOTE — Patient Instructions (Signed)

## 2014-08-26 ENCOUNTER — Other Ambulatory Visit: Payer: Self-pay | Admitting: Neurology

## 2014-09-14 ENCOUNTER — Other Ambulatory Visit: Payer: Self-pay | Admitting: Neurology

## 2014-09-19 ENCOUNTER — Ambulatory Visit (INDEPENDENT_AMBULATORY_CARE_PROVIDER_SITE_OTHER): Payer: Medicare Other | Admitting: Cardiology

## 2014-09-19 ENCOUNTER — Encounter: Payer: Self-pay | Admitting: Cardiology

## 2014-09-19 VITALS — BP 113/71 | HR 68 | Ht 70.5 in | Wt 205.0 lb

## 2014-09-19 DIAGNOSIS — I2510 Atherosclerotic heart disease of native coronary artery without angina pectoris: Secondary | ICD-10-CM

## 2014-09-19 DIAGNOSIS — E785 Hyperlipidemia, unspecified: Secondary | ICD-10-CM | POA: Diagnosis not present

## 2014-09-19 DIAGNOSIS — I739 Peripheral vascular disease, unspecified: Secondary | ICD-10-CM

## 2014-09-19 DIAGNOSIS — I779 Disorder of arteries and arterioles, unspecified: Secondary | ICD-10-CM

## 2014-09-19 DIAGNOSIS — R0989 Other specified symptoms and signs involving the circulatory and respiratory systems: Secondary | ICD-10-CM

## 2014-09-19 DIAGNOSIS — R9430 Abnormal result of cardiovascular function study, unspecified: Secondary | ICD-10-CM

## 2014-09-19 DIAGNOSIS — Z720 Tobacco use: Secondary | ICD-10-CM

## 2014-09-19 NOTE — Progress Notes (Signed)
Cardiology Office Note   Date:  09/19/2014   ID:  Eddie MAGALLON Sr., DOB 03-16-1951, MRN [id number]  PCP:  Celedonio Savage, MD  Cardiologist:  Dola Argyle, MD   Chief Complaint  Patient presents with  . Appointment    Follow-up coronary artery disease      History of Present Illness: Eddie LIVOLSI Sr. is a 64 y.o. male who presents today to follow-up coronary artery disease. He underwent CABG many years ago. I saw him last in the office February, 2014. He has not been having any chest pain or shortness of breath. He does continue to smoke. We discussed this and I urged him to try to stop. He has other significant disease including diabetes. He has not had an exercise test for many years. He is not having any symptoms. He dove carotid disease and he needs a follow-up carotid Doppler.    Past Medical History  Diagnosis Date  . Dyslipidemia   . Hypertension   . CAD (coronary artery disease)     catheterizzation. 11/10/07. patent LIMA to the LAD with competitive filling, patent SVG to diagonal that enters a bifurcation point, occluded SVG to the distal marginal w/some collateralization, patent SVG to distal posterior lateral. medical therapy recommended.  EF 55% echo. June,2009. hypokinesis of the mid and distal septal wall. technically difficult study.  . S/P CABG x 18 Sep 2007    2009  . Allergy history, drug     plavix  . Diabetes mellitus     type not specified  . Fibromyalgia   . Psoriatic arthritis   . H/O alcohol abuse     Alcohol abuse in the past  . Cellulitis     s/p right lower extremity cellulitis  . Arm pain     continued discomfort in his arm- seeing Dr. Annie Main  . ED (erectile dysfunction)   . Ejection fraction     EF 55%, echo, June, 2009, hypokinesis mid/distal septal wall, technically difficult study  . Tobacco abuse   . Claudication     Arterial Dopplers, July, 2012, normal with normal ABI  . Ringing in ear     June, 2012 /  carotid Dopplers, in July,  2012, 0-39% bilateral  . Carotid artery disease     Doppler, July, 2012, 0-39% bilateral, followup 2 years  . Drug intolerance     Patient is intolerant to both aspirin and Plavix.  . Glaucoma     right eye  . Polyneuropathy in diabetes(357.2) 10/06/2013    Past Surgical History  Procedure Laterality Date  . Coronary artery bypass graft    . Cataract extraction Right     Patient Active Problem List   Diagnosis Date Noted  . Claudication     Priority: High  . Diabetic polyneuropathy 10/06/2013  . Drug intolerance   . Ringing in ear   . Carotid artery disease   . Dyslipidemia   . Hypertension   . CAD (coronary artery disease)   . Allergy history, drug   . Diabetes mellitus   . Fibromyalgia   . Arm pain   . Ejection fraction   . Tobacco abuse   . S/P CABG x 1 09/18/2007      Current Outpatient Prescriptions  Medication Sig Dispense Refill  . ALPRAZolam (XANAX) 1 MG tablet Take 1 mg by mouth 3 (three) times daily as needed.      Marland Kitchen amLODipine (NORVASC) 10 MG tablet Take 10 mg by mouth daily.      Marland Kitchen  benazepril (LOTENSIN) 20 MG tablet Take 20 mg by mouth daily.     . betamethasone dipropionate (DIPROLENE) 0.05 % cream Apply 1 application topically daily.     . carbamazepine (TEGRETOL) 200 MG tablet TAKE ONE-HALF TABLET BY MOUTH TWICE A DAY FOR 2 WEEKS, THEN ONE TABLET TWICE A DAY 60 tablet 3  . etanercept (ENBREL) 50 MG/ML injection Inject 50 mg into the skin once a week.      . fexofenadine (ALLEGRA) 180 MG tablet Take 180 mg by mouth daily.      . fluticasone (FLONASE) 50 MCG/ACT nasal spray Place 2 sprays into the nose daily.     Marland Kitchen gabapentin (NEURONTIN) 800 MG tablet Take 800 mg by mouth 3 (three) times daily.    Marland Kitchen glipiZIDE (GLUCOTROL) 5 MG tablet Take 5 mg by mouth 2 (two) times daily before a meal.     . HYDROcodone-acetaminophen (VICODIN) 5-500 MG per tablet Take 1 tablet by mouth every 6 (six) hours as needed.      . hydrOXYzine (ATARAX/VISTARIL) 25 MG tablet Take  25 mg by mouth every 8 (eight) hours as needed.     . lidocaine (XYLOCAINE) 5 % ointment APPLY TO AFFECTED AREA THREE TIMES DAILY AS NEEDED 30 g 1  . metFORMIN (GLUCOPHAGE) 1000 MG tablet Take 1,000 mg by mouth 2 (two) times daily.      . metoprolol tartrate (LOPRESSOR) 25 MG tablet Take 25 mg by mouth as needed.     . potassium chloride (K-DUR,KLOR-CON) 10 MEQ tablet Take 10 mEq by mouth 3 (three) times daily.     . predniSONE (DELTASONE) 20 MG tablet Take 20 mg by mouth as needed.     Marland Kitchen PROTOPIC 0.1 % ointment Apply 1 application topically daily.     . rosuvastatin (CRESTOR) 40 MG tablet Take 1 tablet (40 mg total) by mouth daily. 30 tablet 0  . Sildenafil Citrate (VIAGRA PO) Take by mouth daily.      . Tamsulosin HCl (FLOMAX) 0.4 MG CAPS Take 0.4 mg by mouth daily after supper.      No current facility-administered medications for this visit.    Allergies:   Cymbalta; Paroxetine; and Terbinafine hcl    Social History:  The patient  reports that he has been smoking Cigarettes.  He started smoking about 53 years ago. He has a 45 pack-year smoking history. He has never used smokeless tobacco. He reports that he drinks alcohol. He reports that he does not use illicit drugs.   Family History:  The patient's family history includes Cancer in his brother; Coronary artery disease in his father and another family member; Dementia in his mother; Diabetes in his mother; Heart attack in his father.    ROS:  Please see the history of present illness.     Patient denies fever, chills, headache, sweats, rash, change in vision, change in hearing, chest pain, cough, nausea or vomiting, urinary symptoms. All other systems are reviewed and are negative.   PHYSICAL EXAM: VS:  BP 113/71 mmHg  Pulse 68  Ht 5' 10.5" (1.791 m)  Wt 205 lb (92.987 kg)  BMI 28.99 kg/m2 , The patient is oriented to person time and place. Affect is normal. Head is atraumatic. Sclera and conjunctiva are normal. There is no  jugular venous distention. Lungs are clear. Respiratory effort is not labored. Cardiac exam reveals S1 and S2. The abdomen is protuberant but soft. There is no peripheral edema. There are no musculoskeletal deformities. There are no  skin rashes. Neurologic is grossly intact. The patient does have an odor of a cigarette smoker.  EKG:   EKG is done today. I've carefully compared to prior tracings. There is sinus rhythm. He has evidence of an old anteroseptal infarct. There is persistent slight ST elevation in V1 to V3 with T-wave inversions across the precordium. This is unchanged from the past.   Recent Labs: No results found for requested labs within last 365 days.    Lipid Panel    Component Value Date/Time   CHOL  11/09/2007 2345    119        ATP III CLASSIFICATION:  <200     mg/dL   Desirable  200-239  mg/dL   Borderline High  >=240    mg/dL   High   TRIG 155* 11/09/2007 2345   HDL 31* 11/09/2007 2345   CHOLHDL 3.8 11/09/2007 2345   VLDL 31 11/09/2007 2345   LDLCALC  11/09/2007 2345    57        Total Cholesterol/HDL:CHD Risk Coronary Heart Disease Risk Table                     Men   Women  1/2 Average Risk   3.4   3.3      Wt Readings from Last 3 Encounters:  09/19/14 205 lb (92.987 kg)  05/23/14 209 lb 9.6 oz (95.074 kg)  10/06/13 209 lb (94.802 kg)      Current medicines are reviewed  The patient understands his medications.     ASSESSMENT AND PLAN:

## 2014-09-19 NOTE — Assessment & Plan Note (Signed)
The patient's last cath was 2009. LIMA to the LAD was patent with some competitive filling. SVG to the diagonal was patent. SVG to the distal marginal was occluded with some collateralization. SVG to the distal posterolateral was patent. Medical therapy was recommended. His cardiac status is stable. He is not having symptoms and prefers not to have many tests. I decided to not push for an exercise test at this time.

## 2014-09-19 NOTE — Assessment & Plan Note (Signed)
The patient has documented carotid artery disease. His last Doppler was July, 2014. I'm arranging for a follow-up Doppler.

## 2014-09-19 NOTE — Patient Instructions (Signed)
Your physician recommends that you continue on your current medications as directed. Please refer to the Current Medication list given to you today. Your physician has requested that you have a carotid duplex. This test is an ultrasound of the carotid arteries in your neck. It looks at blood flow through these arteries that supply the brain with blood. Allow one hour for this exam. There are no restrictions or special instructions. Your physician recommends that you schedule a follow-up appointment in: 1 year. You will receive a reminder letter in the mail in about 10 months reminding you to call and schedule your appointment. If you don't receive this letter, please contact our office. 

## 2014-09-19 NOTE — Assessment & Plan Note (Signed)
The patient is on high-dose Crestor. I had an extensive discussion with him about the importance of using this medicine. I discussed outcome data with him.

## 2014-09-19 NOTE — Assessment & Plan Note (Signed)
History of clay his ejection fraction is normal. No further workup.

## 2014-09-19 NOTE — Assessment & Plan Note (Signed)
We had a careful discussion about smoking. I told him again that it would be helpful to stop now for the future. He says that when his finances are in order he may try harder to stop.  As part of today's evaluation I spent greater than 25 minutes with his total care. More than half of this time has been spent with direct discussion with him. We had a lengthy discussion about Crestor treatment and about his smoking.

## 2014-10-12 ENCOUNTER — Ambulatory Visit (INDEPENDENT_AMBULATORY_CARE_PROVIDER_SITE_OTHER): Payer: Medicare Other

## 2014-10-12 DIAGNOSIS — I779 Disorder of arteries and arterioles, unspecified: Secondary | ICD-10-CM

## 2014-10-12 DIAGNOSIS — I739 Peripheral vascular disease, unspecified: Principal | ICD-10-CM

## 2014-10-25 ENCOUNTER — Telehealth: Payer: Self-pay | Admitting: *Deleted

## 2014-10-25 NOTE — Telephone Encounter (Signed)
Patient informed. 

## 2014-10-25 NOTE — Telephone Encounter (Signed)
-----   Message from Carlena Bjornstad, MD sent at 10/20/2014  5:01 PM EDT ----- Let him know that the doppler is very stable. F/u is recommended in 2 years.

## 2014-11-29 ENCOUNTER — Ambulatory Visit (INDEPENDENT_AMBULATORY_CARE_PROVIDER_SITE_OTHER): Payer: Medicare Other | Admitting: Adult Health

## 2014-11-29 ENCOUNTER — Encounter: Payer: Self-pay | Admitting: Adult Health

## 2014-11-29 VITALS — BP 107/70 | HR 72 | Ht 71.0 in | Wt 202.0 lb

## 2014-11-29 DIAGNOSIS — E1142 Type 2 diabetes mellitus with diabetic polyneuropathy: Secondary | ICD-10-CM | POA: Diagnosis not present

## 2014-11-29 DIAGNOSIS — I2510 Atherosclerotic heart disease of native coronary artery without angina pectoris: Secondary | ICD-10-CM | POA: Diagnosis not present

## 2014-11-29 NOTE — Progress Notes (Signed)
PATIENT: Eddie ESAU Sr. DOB: Jan 21, 1951  REASON FOR VISIT: follow up- diabetic neuropathy                                                                                                                                                               HISTORY FROM: patient  HISTORY OF PRESENT ILLNESS: [name] is a 64 year old male with a history of diabetes and diabetic peripheral neuropathy. He returns today for follow-up. The patient continues to take gabapentin 800 mg 3 times a day. At the last visit he was started on carbamazepine 200 mg twice a day. The patient states that he feels that the carbamazepine has been beneficial. He states that the burning and tingling in his feet normally occur when he is at rest. He states that the pain improves with activity. He states that the burning and tingling extends to the ankles. Denies any changes with his gait. He does state that he's noticed some minimal changes with his balance. Denies any falls. The patient was given lidocaine gel to use and he has noticed a benefit with this as well. Denies any new neurological symptoms. He returns today for an evaluation.  HISTORY 05/23/14 (WILLIS):[name] is a 64 year old white male with a history of diabetes, and a diabetic peripheral neuropathy. The patient has had increasing problems with lancinating pains in the feet. Nerve conduction studies done previously were unremarkable, and the patient is felt to have a small fiber neuropathy. He denies back pain, and indicates that the pain is worse when he is inactive, and when he is standing still. He does not feel as much pain when he is actively walking. He does have some mild balance problems, he has not had any falls. He is on gabapentin taking 800 mg 3 times daily. He could not tolerate Cymbalta, nortriptyline, and a prescription for a compounded topical ointment was not covered by his insurance. He has used Lidoderm patches with minimal benefit. The patient  will wake up frequently at night with the pain. He comes to this office for an evaluation.   REVIEW OF SYSTEMS: Out of a complete 14 system review of symptoms, the patient complains only of the following symptoms, and all other reviewed systems are negative.  Fatigue, hearing loss, ringing in ears, loss of vision, shortness of breath, excessive thirst, u difficulty urinating, painful urination, urgency, joint pain, joint swelling, aching muscles, walking difficulty, rash, restless leg, insomnia, frequent waking, daytime sleepiness  ALLERGIES: Allergies  Allergen Reactions  . Cymbalta [Duloxetine Hcl]     Headache, shortness of breath  . Paroxetine Hives  . Terbinafine Hcl Hives    HOME MEDICATIONS: Outpatient Prescriptions Prior to Visit  Medication Sig Dispense Refill  . ALPRAZolam (XANAX) 1 MG tablet Take 1 mg  by mouth 3 (three) times daily as needed.      Marland Kitchen amLODipine (NORVASC) 10 MG tablet Take 10 mg by mouth daily.      . benazepril (LOTENSIN) 20 MG tablet Take 20 mg by mouth daily.     . betamethasone dipropionate (DIPROLENE) 0.05 % cream Apply 1 application topically daily.     . carbamazepine (TEGRETOL) 200 MG tablet TAKE ONE-HALF TABLET BY MOUTH TWICE A DAY FOR 2 WEEKS, THEN ONE TABLET TWICE A DAY 60 tablet 3  . etanercept (ENBREL) 50 MG/ML injection Inject 50 mg into the skin once a week.      . fexofenadine (ALLEGRA) 180 MG tablet Take 180 mg by mouth daily.      . fluticasone (FLONASE) 50 MCG/ACT nasal spray Place 2 sprays into the nose daily.     Marland Kitchen gabapentin (NEURONTIN) 800 MG tablet Take 800 mg by mouth 3 (three) times daily.    Marland Kitchen glipiZIDE (GLUCOTROL) 5 MG tablet Take 5 mg by mouth 2 (two) times daily before a meal.     . HYDROcodone-acetaminophen (VICODIN) 5-500 MG per tablet Take 1 tablet by mouth every 6 (six) hours as needed.      . hydrOXYzine (ATARAX/VISTARIL) 25 MG tablet Take 25 mg by mouth every 8 (eight) hours as needed.     . lidocaine (XYLOCAINE) 5 % ointment  APPLY TO AFFECTED AREA THREE TIMES DAILY AS NEEDED 30 g 1  . metFORMIN (GLUCOPHAGE) 1000 MG tablet Take 1,000 mg by mouth 2 (two) times daily.      . metoprolol tartrate (LOPRESSOR) 25 MG tablet Take 25 mg by mouth as needed.     . potassium chloride (K-DUR,KLOR-CON) 10 MEQ tablet Take 10 mEq by mouth 3 (three) times daily.     . predniSONE (DELTASONE) 20 MG tablet Take 20 mg by mouth as needed.     Marland Kitchen PROTOPIC 0.1 % ointment Apply 1 application topically daily.     . rosuvastatin (CRESTOR) 40 MG tablet Take 1 tablet (40 mg total) by mouth daily. 30 tablet 0  . Sildenafil Citrate (VIAGRA PO) Take by mouth daily.      . Tamsulosin HCl (FLOMAX) 0.4 MG CAPS Take 0.4 mg by mouth daily after supper.      No facility-administered medications prior to visit.    PAST MEDICAL HISTORY: Past Medical History  Diagnosis Date  . Dyslipidemia   . Hypertension   . CAD (coronary artery disease)     catheterizzation. 11/10/07. patent LIMA to the LAD with competitive filling, patent SVG to diagonal that enters a bifurcation point, occluded SVG to the distal marginal w/some collateralization, patent SVG to distal posterior lateral. medical therapy recommended.  EF 55% echo. June,2009. hypokinesis of the mid and distal septal wall. technically difficult study.  . S/P CABG x 18 Sep 2007    2009  . Allergy history, drug     plavix  . Diabetes mellitus     type not specified  . Fibromyalgia   . Psoriatic arthritis   . H/O alcohol abuse     Alcohol abuse in the past  . Cellulitis     s/p right lower extremity cellulitis  . Arm pain     continued discomfort in his arm- seeing Dr. Annie Main  . ED (erectile dysfunction)   . Ejection fraction     EF 55%, echo, June, 2009, hypokinesis mid/distal septal wall, technically difficult study  . Tobacco abuse   . Claudication  Arterial Dopplers, July, 2012, normal with normal ABI  . Ringing in ear     June, 2012 /  carotid Dopplers, in July, 2012, 0-39% bilateral    . Carotid artery disease     Doppler, July, 2012, 0-39% bilateral, followup 2 years  . Drug intolerance     Patient is intolerant to both aspirin and Plavix.  . Glaucoma     right eye  . Polyneuropathy in diabetes(357.2) 10/06/2013    PAST SURGICAL HISTORY: Past Surgical History  Procedure Laterality Date  . Coronary artery bypass graft    . Cataract extraction Right     FAMILY HISTORY: Family History  Problem Relation Age of Onset  . Coronary artery disease      family hx  . Dementia Mother   . Diabetes Mother   . Coronary artery disease Father   . Heart attack Father   . Cancer Brother     SOCIAL HISTORY: History   Social History  . Marital Status: Divorced    Spouse Name: Katharine Look  . Number of Children: 2  . Years of Education: HS   Occupational History  . disabled - retired    Social History Main Topics  . Smoking status: Current Every Day Smoker -- 1.00 packs/day for 45 years    Types: Cigarettes    Start date: 05/31/1961  . Smokeless tobacco: Never Used  . Alcohol Use: 0.0 oz/week    0 Standard drinks or equivalent per week     Comment: beer/mixed drink  . Drug Use: No  . Sexual Activity: Not on file   Other Topics Concern  . Not on file   Social History Narrative   Patient is right handed patient lives at home with his wife Katharine Look)   Disabled.   Patient drinks caffeine daily.      PHYSICAL EXAM  Filed Vitals:   11/29/14 1441  BP: 107/70  Pulse: 72  Height: 5\' 11"  (1.803 m)  Weight: 202 lb (91.627 kg)   Body mass index is 28.19 kg/(m^2).  Generalized: Well developed, in no acute distress   Neurological examination  Mentation: Alert oriented to time, place, history taking. Follows all commands speech and language fluent Cranial nerve II-XII: Pupils were equal round reactive to light. Extraocular movements were full, visual field were full on confrontational test. Facial sensation and strength were normal. Uvula tongue midline. Head  turning and shoulder shrug  were normal and symmetric. Motor: The motor testing reveals 5 over 5 strength of all 4 extremities. Good symmetric motor tone is noted throughout.  Sensory: Sensory testing is intact to soft touch on all 4 extremities. No evidence of extinction is noted.  Coordination: Cerebellar testing reveals good finger-nose-finger and heel-to-shin bilaterally.  Gait and station: Gait is normal. Tandem gait is normal. Romberg is negative. No drift is seen.  Reflexes: Deep tendon reflexes are symmetric and normal bilaterally.   DIAGNOSTIC DATA (LABS, IMAGING, TESTING) - I reviewed patient records, labs, notes, testing and imaging myself where available.      ASSESSMENT AND PLAN 64 y.o. year old male  has a past medical history of Dyslipidemia; Hypertension; CAD (coronary artery disease); S/P CABG x 1 (may 2009); Allergy history, drug; Diabetes mellitus; Fibromyalgia; Psoriatic arthritis; H/O alcohol abuse; Cellulitis; Arm pain; ED (erectile dysfunction); Ejection fraction; Tobacco abuse; Claudication; Ringing in ear; Carotid artery disease; Drug intolerance; Glaucoma; and Polyneuropathy in diabetes(357.2) (10/06/2013). here with:  1. Diabetic neuropathy  The patient's burning and tingling pain has improved  with carbamazepine. He will continue to take carbamazepine doing 200 milligrams twice a day. He will continue gabapentin 800 mg 3 times a day. He can also continue the lidocaine gel. In the future if his pain worsens we can try increasing the carbamazepine. Patient advised that if his symptoms worsen or he develops new symptoms he should let us know. Otherwise he will follow-up in 6 months or sooner if needed.     Ward Givens, MSN, NP-C 11/29/2014, 2:46 PM [facility] [address] [place], [place] [address] [phone]  Note: This document was prepared with digital dictation and possible smart phrase technology. Any transcriptional errors  that result from this process are unintentional.

## 2014-11-29 NOTE — Progress Notes (Signed)
I have read the note, and I agree with the clinical assessment and plan.  Eddie Reed,Eddie Reed   

## 2014-11-29 NOTE — Patient Instructions (Signed)
Continue Gabapentin and Carbamazepine If your symptoms worsen or you develop new symptoms please let us know.

## 2015-01-11 ENCOUNTER — Other Ambulatory Visit: Payer: Self-pay | Admitting: Neurology

## 2015-03-01 ENCOUNTER — Encounter: Payer: Self-pay | Admitting: *Deleted

## 2015-03-02 ENCOUNTER — Encounter: Payer: Self-pay | Admitting: Cardiology

## 2015-03-02 ENCOUNTER — Ambulatory Visit (INDEPENDENT_AMBULATORY_CARE_PROVIDER_SITE_OTHER): Payer: Medicare Other | Admitting: Cardiology

## 2015-03-02 VITALS — BP 127/80 | HR 66 | Ht 70.0 in | Wt 212.8 lb

## 2015-03-02 DIAGNOSIS — I2510 Atherosclerotic heart disease of native coronary artery without angina pectoris: Secondary | ICD-10-CM

## 2015-03-02 DIAGNOSIS — E785 Hyperlipidemia, unspecified: Secondary | ICD-10-CM | POA: Diagnosis not present

## 2015-03-02 DIAGNOSIS — I739 Peripheral vascular disease, unspecified: Secondary | ICD-10-CM

## 2015-03-02 DIAGNOSIS — I779 Disorder of arteries and arterioles, unspecified: Secondary | ICD-10-CM

## 2015-03-02 DIAGNOSIS — R0602 Shortness of breath: Secondary | ICD-10-CM | POA: Diagnosis not present

## 2015-03-02 DIAGNOSIS — I639 Cerebral infarction, unspecified: Secondary | ICD-10-CM

## 2015-03-02 MED ORDER — NICOTINE 21 MG/24HR TD PT24: 21.0000 mg | MEDICATED_PATCH | Freq: Every day | TRANSDERMAL | Status: DC

## 2015-03-02 MED ORDER — NICOTINE 7 MG/24HR TD PT24: 7.0000 mg | MEDICATED_PATCH | Freq: Every day | TRANSDERMAL | Status: DC

## 2015-03-02 MED ORDER — NICOTINE 14 MG/24HR TD PT24: 14.0000 mg | MEDICATED_PATCH | Freq: Every day | TRANSDERMAL | Status: DC

## 2015-03-02 NOTE — Progress Notes (Signed)
Patient ID: Eddie Reed., male   DOB: 02-19-1951, 64 y.o.   MRN: [id number]     Clinical Summary Eddie Reed is a 64 y.o.male last seen by Dr Eddie Reed, this is our first visit together. He is seen for the following medical problems.  1. CAD - history of prior CABG. Last cath 2009 as described below, patent grafts other than occluded SVG-marginal however there were collaterals and medically managed - reports allergy to plavix. Reports ASA upsets stomach. Recently back on ASA and having some burning feeling at times in epigastrium.  - denies any chest pain. Notes some SOB at times, better with inhaler.  -compliant with meds  2. Hyperlipidemia - compliant with statin  3. DM2 - followed by pcp  4. Carotid stenosis - most recent US 02/2015 without significant obstructive disease   5. CVA - presented with acute left sided weakness to [place]. Did not receive tPA,  transferred to [facility] - MRI of the brain without contrast on showed Multiple small acute/recent embolic type infarcts within the MCA territory of the right frontal and parietal lobes with no associated hemorrhage or mass effect - Echocardiogram with Doppler complete with bubble study was obtained on 02/18/15, showed no cardiac etiology for the stroke.Left ventricular ejection fraction was 55 %. - reports weakness has resolved.  - has f/u pending with neuro   6. Tobacco abuse - uses prn albuterol only.  - tobacco x 50+ years. Denies ever having PFTs   Past Medical History  Diagnosis Date  . Dyslipidemia   . Hypertension   . CAD (coronary artery disease)     catheterizzation. 11/10/07. patent LIMA to the LAD with competitive filling, patent SVG to diagonal that enters a bifurcation point, occluded SVG to the distal marginal w/some collateralization, patent SVG to distal posterior lateral. medical therapy recommended.  EF 55% echo. June,2009. hypokinesis of the mid and distal septal wall. technically difficult  study.  . S/P CABG x 18 Sep 2007    2009  . Allergy history, drug     plavix  . Diabetes mellitus     type not specified  . Fibromyalgia   . Psoriatic arthritis   . H/O alcohol abuse     Alcohol abuse in the past  . Cellulitis     s/p right lower extremity cellulitis  . Arm pain     continued discomfort in his arm- seeing Dr. Annie Reed  . ED (erectile dysfunction)   . Ejection fraction     EF 55%, echo, June, 2009, hypokinesis mid/distal septal wall, technically difficult study  . Tobacco abuse   . Claudication     Arterial Dopplers, July, 2012, normal with normal ABI  . Ringing in ear     June, 2012 /  carotid Dopplers, in July, 2012, 0-39% bilateral  . Carotid artery disease     Doppler, July, 2012, 0-39% bilateral, followup 2 years  . Drug intolerance     Patient is intolerant to both aspirin and Plavix.  . Glaucoma     right eye  . Polyneuropathy in diabetes(357.2) 10/06/2013     Allergies  Allergen Reactions  . Cymbalta [Duloxetine Hcl]     Headache, shortness of breath  . Paroxetine Hives  . Terbinafine Hcl Hives     Current Outpatient Prescriptions  Medication Sig Dispense Refill  . ALPRAZolam (XANAX) 1 MG tablet Take 1 mg by mouth 3 (three) times daily as needed.      Marland Kitchen amLODipine (NORVASC) 10  MG tablet Take 10 mg by mouth daily.      . benazepril (LOTENSIN) 20 MG tablet Take 20 mg by mouth daily.     . betamethasone dipropionate (DIPROLENE) 0.05 % cream Apply 1 application topically daily.     . carbamazepine (TEGRETOL) 200 MG tablet TAKE ONE TABLET BY MOUTH TWICE DAILY 60 tablet 3  . etanercept (ENBREL) 50 MG/ML injection Inject 50 mg into the skin once a week.      . fexofenadine (ALLEGRA) 180 MG tablet Take 180 mg by mouth daily.      . fluticasone (FLONASE) 50 MCG/ACT nasal spray Place 2 sprays into the nose daily.     Marland Kitchen gabapentin (NEURONTIN) 800 MG tablet Take 800 mg by mouth 3 (three) times daily.    Marland Kitchen glipiZIDE (GLUCOTROL) 5 MG tablet Take 5 mg by  mouth 2 (two) times daily before a meal.     . HYDROcodone-acetaminophen (VICODIN) 5-500 MG per tablet Take 1 tablet by mouth every 6 (six) hours as needed.      . hydrOXYzine (ATARAX/VISTARIL) 25 MG tablet Take 25 mg by mouth every 8 (eight) hours as needed.     . lidocaine (XYLOCAINE) 5 % ointment APPLY TO AFFECTED AREA THREE TIMES DAILY AS NEEDED 30 g 1  . metFORMIN (GLUCOPHAGE) 1000 MG tablet Take 1,000 mg by mouth 2 (two) times daily.      . metoprolol tartrate (LOPRESSOR) 25 MG tablet Take 25 mg by mouth as needed.     . potassium chloride (K-DUR,KLOR-CON) 10 MEQ tablet Take 10 mEq by mouth 3 (three) times daily.     . predniSONE (DELTASONE) 20 MG tablet Take 20 mg by mouth as needed.     Marland Kitchen PROTOPIC 0.1 % ointment Apply 1 application topically daily.     . rosuvastatin (CRESTOR) 40 MG tablet Take 1 tablet (40 mg total) by mouth daily. 30 tablet 0  . Sildenafil Citrate (VIAGRA PO) Take by mouth daily.      . Tamsulosin HCl (FLOMAX) 0.4 MG CAPS Take 0.4 mg by mouth daily after supper.      No current facility-administered medications for this visit.     Past Surgical History  Procedure Laterality Date  . Coronary artery bypass graft    . Cataract extraction Right      Allergies  Allergen Reactions  . Cymbalta [Duloxetine Hcl]     Headache, shortness of breath  . Paroxetine Hives  . Terbinafine Hcl Hives      Family History  Problem Relation Age of Onset  . Coronary artery disease      family hx  . Dementia Mother   . Diabetes Mother   . Coronary artery disease Father   . Heart attack Father   . Cancer Brother      Social History Eddie Reed reports that he has been smoking Cigarettes.  He started smoking about 53 years ago. He has a 45 pack-year smoking history. He has never used smokeless tobacco. Eddie Reed reports that he drinks alcohol.   Review of Systems CONSTITUTIONAL: No weight loss, fever, chills, weakness or fatigue.  HEENT: Eyes: No visual loss,  blurred vision, double vision or yellow sclerae.No hearing loss, sneezing, congestion, runny nose or sore throat.  SKIN: No rash or itching.  CARDIOVASCULAR: per HPI RESPIRATORY: No  cough or sputum.  GASTROINTESTINAL: No anorexia, nausea, vomiting or diarrhea. No abdominal pain or blood.  GENITOURINARY: No burning on urination, no polyuria NEUROLOGICAL: No headache, dizziness, syncope,  paralysis, ataxia, numbness or tingling in the extremities. No change in bowel or bladder control.  MUSCULOSKELETAL: No muscle, back pain, joint pain or stiffness.  LYMPHATICS: No enlarged nodes. No history of splenectomy.  PSYCHIATRIC: No history of depression or anxiety.  ENDOCRINOLOGIC: No reports of sweating, cold or heat intolerance. No polyuria or polydipsia.  Marland Kitchen   Physical Examination Filed Vitals:   03/02/15 1600  BP: 127/80  Pulse: 66   Filed Vitals:   03/02/15 1600  Height: 5\' 10"  (1.778 m)  Weight: 212 lb 12.8 oz (96.525 kg)    Gen: resting comfortably, no acute distress HEENT: no scleral icterus, pupils equal round and reactive, no palptable cervical adenopathy,  CV: RRR, no m/r/g no jvd Resp: Clear to auscultation bilaterally GI: abdomen is soft, non-tender, non-distended, normal bowel sounds, no hepatosplenomegaly MSK: extremities are warm, no edema.  Skin: warm, no rash Neuro:  no focal deficits Psych: appropriate affect   Diagnostic Studies 09/2014 Carotid US Bilateral 1-39% carotid stenosis  10/2007 echo  SUMMARY - Overall left ventricular systolic function was normal. Left    ventricular ejection fraction was estimated to be 55 %.    Findings were suggestive of hypokinesis of the mid-distal    septal wall. Left ventricular wall thickness was mildly    increased.   10/2007 CathHEMODYNAMIC DATA: Central aortic pressure was 106/73, mean 88.  ANGIOGRAPHIC DATA: 1. The left Reed was free of critical disease. 2. The LAD coursed to the apex. The LAD  has been stented in the acute  setting prior to a surgery. There is flow down the LAD and what  appears to be competitive flow after a septal perforator. The  stent has mild luminal irregularities, but is intact. It is a bare-  metal stent. There is competitive filling in the distal vessel.  Importantly, injection of the internal mammary reveals an internal  mammary that is patent. There is perhaps a 30-40% area of bending  in the vessel, and the distal LAD does not fill through the  internal mammary nor does you see it well from the graft, although  there is clear evidence of the competitive filling of the distal  LAD territory. The major diagonal Eddie Reed has 80% narrowing and  then 90% narrowing then bifurcates. One bifurcation has  substantial disease and the graft appears to enter right just  beyond this or in the other Ramie Palladino where there is evidence of clear-  cut competitive filling. 3. The saphenous vein graft to the diagonal Hilton Saephan is widely patent.  There is slow runoff of the graft, but that is due to size  mismatch. 4. There is a small ramus intermedius that appears to be subtotally  occluded. There is what appears to be competitive filling distally  and this is likely due to collateralization. Likewise, the  circumflex is totally occluded and the OM graft is totally  occluded. 5. The right coronary artery has multiple lesions of 50% after a  stent. There is a 30% lesion and some diffuse luminal  irregularities of 30-40% distally. There is then an 80% stenosis  after a small PDA and then evidence of competitive filling  distally. There is a vein graft to the PDA that appears to be  angiographically intact.  CONCLUSION: 1. Continued patency of the left internal mammary to the LAD, but with  evidence of what appears to be some competitive filling. 2.  Patent saphenous vein graft to a diagonal that enters at a  bifurcation point. 3.  Occluded saphenous vein graft to the distal marginal with at least  collateralization of what is likely the ramus and/or circumflex  itself. 4. Continued patency of the saphenous vein graft to the distal  posterolateral segment.  Cath DISPOSITION: At the present time, cardiac rehab and medical therapy will be recommended.   02/2015 [facility] Carotid US IMPRESSION: Bilateral ASVD. No significant stenosis.    Stenosis is validated by velocity measurements with corresponding angiographic measurements and velocity criteria extrapolated in a process based on that defined by the Society of Radiologists in Ultrasound.   02/2015 [facility] Echo Interpretation Summary  A complete portable two-dimensional transthoracic echocardiogram was  performed. The study was technically difficult. The left ventricle is  mildly dilated.  There is mild concentric left ventricular hypertrophy.  The aortic valve is trileaflet.  There is anterior wall hypokinesis.  Unable to adequately determine diastolic dysfunction.  The left atrium is mildly dilated.  The left ventricular ejection fraction is normal (50-55%).    Left Ventricle  The left ventricle is mildly dilated. There is mild concentric left  ventricular hypertrophy. There is anterior wall hypokinesis. The left  ventricular ejection fraction is normal (50-55%). Unable to adequately  determine diastolic dysfunction.      Right Ventricle  The right ventricle is not well visualized. The right ventricle is grossly  normal in size and function.    Atria  The left atrium is mildly dilated. The right atrium is normal. The IVC is  normal in size.    Mitral Valve  There is mild mitral annular calcification. There is no mitral  regurgitation.      Tricuspid Valve  The tricuspid valve is grossly normal. There is no  tricuspid regurgitation.    Aortic Valve  The aortic valve opens well. The aortic valve is trileaflet. There is no  aortic regurgitation present.    Pulmonic Valve  The pulmonic valve is not well visualized.    Vessels  The aortic root is normal in diameter.    Pericardium  There is no pericardial effusion.    MMode/2D Measurements & Calculations  IVSd: 1.3 cmLVIDd: 5.0 cm  LVIDs: 3.6 cm  LVPWd: 1.2 cm    _____________________________________________________________  LV mass(C)d: 257.0 gramsAo root diam: 3.7 cm    LV mass(C)dI: 120.6 grams/m2Ao root area: 10.6 cm2  LA dimension: 4.1 cm    Doppler Measurements & Calculations  MV E max vel: 90.8 cm/secMV dec time: 0.26 sec  MV A max vel: 74.0 cm/sec  MV E/A: 1.2     Assessment and Plan  1. CAD - no current symptoms, continue current meds  2. Hyperipidemia - continue high dose statin in setting of known CAD  3. Carotid stenosis - no significant obstructive disease by most recent US, continue to follow clinically  4. CVA - recent CVA with admission to [facility]. Plavix allergy, history of stomach upset on ASA. Back on ASA s/p CVA, noting some burning epigastic pain at times. Will start nexium  5. Tobacco abuse - will give Rx for nicotine patch  6. SOB - SOB at times better with albuterol. Never had formal PFTs, will order   F/u 4 months      Arnoldo Lenis, M.D.,

## 2015-03-02 NOTE — Patient Instructions (Signed)
Your physician wants you to follow-up in: 4 months with Dr. Bryna Colander will receive a reminder letter in the mail two months in advance. If you don't receive a letter, please call our office to schedule the follow-up appointment.  Your physician has recommended you make the following change in your medication:   START NEXIUM 40 MG DAILY  START NICOTINE PATCH 21 MG DAILY FOR 6 WEEKS  14 MG PATCH DAILY FOR 2 WEEKS AFTER FINISHING 21 MG  7 MG PATCH DAILY FOR 2 WEEKS AFTER FINISHING 14 MG  Your physician has recommended that you have a pulmonary function test. Pulmonary Function Tests are a group of tests that measure how well air moves in and out of your lungs. WE HAVE GIVEN YOU ORDERS TO HAVE TEST DONE AT [facility]   Thank you for choosing [facility]!!

## 2015-03-08 ENCOUNTER — Ambulatory Visit (INDEPENDENT_AMBULATORY_CARE_PROVIDER_SITE_OTHER): Payer: Medicare Other | Admitting: Neurology

## 2015-03-08 ENCOUNTER — Encounter: Payer: Self-pay | Admitting: Neurology

## 2015-03-08 VITALS — BP 138/78 | HR 60 | Ht 70.0 in | Wt 214.0 lb

## 2015-03-08 DIAGNOSIS — I6340 Cerebral infarction due to embolism of unspecified cerebral artery: Secondary | ICD-10-CM | POA: Diagnosis not present

## 2015-03-08 DIAGNOSIS — I480 Paroxysmal atrial fibrillation: Secondary | ICD-10-CM

## 2015-03-08 DIAGNOSIS — E1142 Type 2 diabetes mellitus with diabetic polyneuropathy: Secondary | ICD-10-CM | POA: Diagnosis not present

## 2015-03-08 DIAGNOSIS — I2510 Atherosclerotic heart disease of native coronary artery without angina pectoris: Secondary | ICD-10-CM

## 2015-03-08 HISTORY — DX: Cerebral infarction due to embolism of unspecified cerebral artery: I63.40

## 2015-03-08 MED ORDER — BUPROPION HCL 100 MG PO TABS: ORAL_TABLET | ORAL | Status: DC

## 2015-03-08 NOTE — Patient Instructions (Addendum)
Continue aspirin therapy, stop smoking. We will check MRI of the brain, and check a 30 day cardiac monitor study. We will get use set up for physical and occupational therapy. With the carbamazepine for the peripheral neuropathy, start one half tablet twice daily for 2 weeks, then go to 1 full tablet twice daily.  Stroke Prevention Some medical conditions and behaviors are associated with an increased chance of having a stroke. You may prevent a stroke by making healthy choices and managing medical conditions. HOW CAN I REDUCE MY RISK OF HAVING A STROKE?   Stay physically active. Get at least 30 minutes of activity on most or all days.  Do not smoke. It may also be helpful to avoid exposure to secondhand smoke.  Limit alcohol use. Moderate alcohol use is considered to be:  No more than 2 drinks per day for men.  No more than 1 drink per day for nonpregnant women.  Eat healthy foods. This involves:  Eating 5 or more servings of fruits and vegetables a day.  Making dietary changes that address high blood pressure (hypertension), high cholesterol, diabetes, or obesity.  Manage your cholesterol levels.  Making food choices that are high in fiber and low in saturated fat, trans fat, and cholesterol may control cholesterol levels.  Take any prescribed medicines to control cholesterol as directed by your health care provider.  Manage your diabetes.  Controlling your carbohydrate and sugar intake is recommended to manage diabetes.  Take any prescribed medicines to control diabetes as directed by your health care provider.  Control your hypertension.  Making food choices that are low in salt (sodium), saturated fat, trans fat, and cholesterol is recommended to manage hypertension.  Ask your health care provider if you need treatment to lower your blood pressure. Take any prescribed medicines to control hypertension as directed by your health care provider.  If you are 18-39 years  of age, have your blood pressure checked every 3-5 years. If you are 3 years of age or older, have your blood pressure checked every year.  Maintain a healthy weight.  Reducing calorie intake and making food choices that are low in sodium, saturated fat, trans fat, and cholesterol are recommended to manage weight.  Stop drug abuse.  Avoid taking birth control pills.  Talk to your health care provider about the risks of taking birth control pills if you are over 89 years old, smoke, get migraines, or have ever had a blood clot.  Get evaluated for sleep disorders (sleep apnea).  Talk to your health care provider about getting a sleep evaluation if you snore a lot or have excessive sleepiness.  Take medicines only as directed by your health care provider.  For some people, aspirin or blood thinners (anticoagulants) are helpful in reducing the risk of forming abnormal blood clots that can lead to stroke. If you have the irregular heart rhythm of atrial fibrillation, you should be on a blood thinner unless there is a good reason you cannot take them.  Understand all your medicine instructions.  Make sure that other conditions (such as anemia or atherosclerosis) are addressed. SEEK IMMEDIATE MEDICAL CARE IF:   You have sudden weakness or numbness of the face, arm, or leg, especially on one side of the body.  Your face or eyelid droops to one side.  You have sudden confusion.  You have trouble speaking (aphasia) or understanding.  You have sudden trouble seeing in one or both eyes.  You have sudden trouble  walking.  You have dizziness.  You have a loss of balance or coordination.  You have a sudden, severe headache with no known cause.  You have new chest pain or an irregular heartbeat. Any of these symptoms may represent a serious problem that is an emergency. Do not wait to see if the symptoms will go away. Get medical help at once. Call your local emergency services (911 in  [place] Do not drive yourself to the hospital.   This information is not intended to replace advice given to you by your health care provider. Make sure you discuss any questions you have with your health care provider.   Document Released: 06/13/2004 Document Revised: 05/27/2014 Document Reviewed: 11/06/2012 Elsevier Interactive Patient Education [employer].

## 2015-03-08 NOTE — Progress Notes (Signed)
Reason for visit: Stroke  Eddie LINAREZ Sr. is an 64 y.o. male  History of present illness:  Eddie Reed is a 64 year old right-handed white male with a history of diabetes and a diabetic peripheral neuropathy. The patient has been followed here for his neuropathic symptoms, currently treated with gabapentin. He indicates that he has at some point gone off of the carbamazepine, he has had increased problems with pain. He is getting electrotherapy with [facility], this has not offered much benefit so far. He comes in today with a new problem. Around 02/17/2015, the patient noted sudden onset of left-sided numbness and weakness. The patient initially went to [facility], and was transferred to [facility]. MRI brain evaluation at that time showed evidence of a right middle cerebral artery distribution stroke with multiple infarcts in that distribution including the right frontal and parietal lobe. The patient underwent a carotid Doppler study that showed some atherosclerotic changes, no significant stenosis. A 2-D echocardiogram did not show evidence of cardiogenic source of stroke. He underwent a MRA of the head that showed some disease of the right M2 segment, there is some question that this was artifact. The patient had not been on any antiplatelet agent prior to the admission, he was placed on low-dose aspirin. He indicates that he is allergic to Plavix. Since discharge from the hospital, the patient has not received any physical therapy. On 03/06/2015, the patient had some worsening of left-sided symptoms involving the arm with increased weakness and clumsiness. He returned to the hospital, was told that he had a right radial neuropathy. The patient comes to this office for an evaluation. The patient continues to smoke.  Past Medical History  Diagnosis Date  . Dyslipidemia   . Hypertension   . CAD (coronary artery disease)     catheterizzation. 11/10/07. patent LIMA to the LAD with  competitive filling, patent SVG to diagonal that enters a bifurcation point, occluded SVG to the distal marginal w/some collateralization, patent SVG to distal posterior lateral. medical therapy recommended.  EF 55% echo. June,2009. hypokinesis of the mid and distal septal wall. technically difficult study.  . S/P CABG x 18 Sep 2007    2009  . Allergy history, drug     plavix  . Diabetes mellitus     type not specified  . Fibromyalgia   . Psoriatic arthritis ([place])   . H/O alcohol abuse     Alcohol abuse in the past  . Cellulitis     s/p right lower extremity cellulitis  . Arm pain     continued discomfort in his arm- seeing Dr. Annie Main  . ED (erectile dysfunction)   . Ejection fraction     EF 55%, echo, June, 2009, hypokinesis mid/distal septal wall, technically difficult study  . Tobacco abuse   . Claudication [facility])     Arterial Dopplers, July, 2012, normal with normal ABI  . Ringing in ear     June, 2012 /  carotid Dopplers, in July, 2012, 0-39% bilateral  . Carotid artery disease ([place])     Doppler, July, 2012, 0-39% bilateral, followup 2 years  . Drug intolerance     Patient is intolerant to both aspirin and Plavix.  . Glaucoma     right eye  . Polyneuropathy in diabetes(357.2) 10/06/2013  . Stroke due to embolism of cerebral artery ([place]) 03/08/2015    Right MCA    Past Surgical History  Procedure Laterality Date  . Coronary artery bypass graft    .  Cataract extraction Right     Family History  Problem Relation Age of Onset  . Coronary artery disease      family hx  . Dementia Mother   . Diabetes Mother   . Coronary artery disease Father   . Heart attack Father   . Cancer Brother     Social history:  reports that he has been smoking Cigarettes.  He started smoking about 53 years ago. He has a 56.25 pack-year smoking history. He has never used smokeless tobacco. He reports that he drinks alcohol. He reports that he does not use illicit drugs.    Allergies    Allergen Reactions  . Clopidogrel Hives  . Levofloxacin Hives  . Cymbalta [Duloxetine Hcl]     Headache, shortness of breath  . Paroxetine Hives  . Terbinafine Hcl Hives    Medications:  Prior to Admission medications   Medication Sig Start Date End Date Taking? Authorizing Provider  ALPRAZolam Duanne Moron) 1 MG tablet Take 1 mg by mouth 3 (three) times daily as needed.     Yes Historical Provider, MD  amLODipine (NORVASC) 10 MG tablet Take 10 mg by mouth daily.     Yes Historical Provider, MD  aspirin 81 MG tablet Take 81 mg by mouth daily.   Yes Historical Provider, MD  atorvastatin (LIPITOR) 80 MG tablet Take 80 mg by mouth daily. 02/19/15 02/19/16 Yes Historical Provider, MD  benazepril (LOTENSIN) 20 MG tablet Take 20 mg by mouth daily.  06/24/12  Yes Historical Provider, MD  betamethasone dipropionate (DIPROLENE) 0.05 % cream Apply 1 application topically daily.  06/24/12  Yes Historical Provider, MD  carbamazepine (TEGRETOL) 200 MG tablet TAKE ONE TABLET BY MOUTH TWICE DAILY 01/11/15  Yes Kathrynn Ducking, MD  clobetasol (OLUX) 0.05 % topical foam Apply 1 application topically 2 (two) times daily.   Yes Historical Provider, MD  esomeprazole (NEXIUM) 40 MG capsule Take 40 mg by mouth daily at 12 noon.   Yes Historical Provider, MD  etanercept (ENBREL) 50 MG/ML injection Inject 50 mg into the skin once a week.     Yes Historical Provider, MD  fluticasone (FLONASE) 50 MCG/ACT nasal spray Place 2 sprays into the nose daily.  05/21/12  Yes Historical Provider, MD  gabapentin (NEURONTIN) 800 MG tablet Take 800 mg by mouth 3 (three) times daily.   Yes Historical Provider, MD  glipiZIDE (GLUCOTROL) 5 MG tablet Take 5 mg by mouth 2 (two) times daily before a meal.  05/21/12  Yes Historical Provider, MD  HYDROcodone-acetaminophen (VICODIN) 5-500 MG per tablet Take 1 tablet by mouth every 6 (six) hours as needed.     Yes Historical Provider, MD  hydrOXYzine (ATARAX/VISTARIL) 25 MG tablet Take 25 mg by mouth  every 8 (eight) hours as needed.  06/24/12  Yes Historical Provider, MD  ketoconazole (NIZORAL) 2 % cream Apply 1 application topically daily as needed.   Yes Historical Provider, MD  lidocaine (XYLOCAINE) 5 % ointment APPLY TO AFFECTED AREA THREE TIMES DAILY AS NEEDED 08/26/14  Yes Kathrynn Ducking, MD  metFORMIN (GLUCOPHAGE) 1000 MG tablet Take 1,000 mg by mouth 2 (two) times daily.     Yes Historical Provider, MD  metoprolol tartrate (LOPRESSOR) 25 MG tablet Take 25 mg by mouth as needed.    Yes Historical Provider, MD  nicotine (NICODERM CQ) 14 mg/24hr patch Place 1 patch (14 mg total) onto the skin daily. 03/02/15  Yes Arnoldo Lenis, MD  nicotine (NICODERM CQ) 21 mg/24hr patch  Place 1 patch (21 mg total) onto the skin daily. 03/02/15  Yes Arnoldo Lenis, MD  nicotine (NICODERM CQ) 7 mg/24hr patch Place 1 patch (7 mg total) onto the skin daily. 03/02/15  Yes Arnoldo Lenis, MD  Sildenafil Citrate (VIAGRA PO) Take by mouth daily.     Yes Historical Provider, MD  Tamsulosin HCl (FLOMAX) 0.4 MG CAPS Take 0.4 mg by mouth daily after supper.  06/24/12  Yes Historical Provider, MD    ROS:  Out of a complete 14 system review of symptoms, the patient complains only of the following symptoms, and all other reviewed systems are negative.  Fatigue Ringing in the ears Wheezing Excessive thirst Restless legs, sleep talking Difficulty urinating, frequency of urination, urinary urgency Joint pain, joint swelling, back pain Itching Headache, numbness Anxiety  Blood pressure 138/78, pulse 60, height 5\' 10"  (1.778 m), weight 214 lb (97.07 kg).  Physical Exam  General: The patient is alert and cooperative at the time of the examination.  Neck: No carotid bruits are noted.  Respiratory: Lung fields are clear.  Cardiovascular: Regular rate and rhythm, no obvious murmurs or rubs are noted.  Skin: No significant peripheral edema is noted.   Neurologic Exam  Mental status: The patient is  alert and oriented x 3 at the time of the examination. The patient has apparent normal recent and remote memory, with an apparently normal attention span and concentration ability.   Cranial nerves: Facial symmetry is present. Speech is normal, no aphasia or dysarthria is noted. Extraocular movements are full. Visual fields are full.  Motor: The patient has good strength in all 4 extremities, with exception that he has 4/5 strength with grip on the left hand.  Sensory examination: Soft touch sensation is symmetric on the face, arms, and legs. Pinprick sensation is symmetric on all fours.  Coordination: The patient has ataxia with finger-nose-finger bilaterally, heel-to-shin is more symmetric and normal.  Gait and station: The patient has a wide-based, unsteady gait. Tandem gait is unsteady. Romberg is negative. Some drift of the left upper extremity is noted.  Reflexes: Deep tendon reflexes are symmetric, but are depressed.   Assessment/Plan:  1. Diabetes  2. Diabetic peripheral neuropathy  3. Gait disorder  4. Right middle cerebral artery distribution stroke, new onset  The patient has had a recent exacerbation of his original stroke in the end of September 2016. The patient will be sent back for MRI evaluation of the brain, he will be sent for a 30 day cardiac monitor study as the stroke appears to be embolic in nature, and the source of stroke has not been determined. The patient will remain on low-dose aspirin for now. He will be sent for physical therapy and occupational therapy. He will be given a prescription for Wellbutrin for smoking cessation. He indicates an increase in his peripheral neuropathy discomfort, but he has come off of the carbamazepine. He will go back on this medication. He will follow-up in 3 months.  Jill Alexanders MD 03/08/2015 11:46 AM  [facility] [address] [place], [place] [address]  Phone [phone] Fax  [phone]

## 2015-03-10 ENCOUNTER — Telehealth: Payer: Self-pay | Admitting: *Deleted

## 2015-03-10 ENCOUNTER — Encounter: Payer: Self-pay | Admitting: *Deleted

## 2015-03-10 NOTE — Telephone Encounter (Signed)
F/u on PFT's to be done at [facility] as per pt request and pt says he has upcoming event monitor to wear, MRI to be done, and will call us back when ready to have PFT's completed. Pt confirmed 10/25 appt for getting event monitor and gave pt address and phone of [address] office. Pt appreciative of call

## 2015-03-13 ENCOUNTER — Telehealth: Payer: Self-pay | Admitting: Neurology

## 2015-03-13 NOTE — Telephone Encounter (Signed)
I called the patient. The left arm weakness I believe was an exacerbation of the original stroke. I do not believe that he has a neuropathy. We have ordered MRI the brain, 30 day cardiac monitor study.

## 2015-03-13 NOTE — Telephone Encounter (Signed)
I called patient and left a voicemail asking him to call me back.

## 2015-03-13 NOTE — Telephone Encounter (Signed)
I called the patient. The symptoms he describes sound exactly like what he c/o in his office visit on 10/19. He has been taking tegretol since that appointment, but states it hasn't helped yet. He wanted to let Dr. Jannifer Franklin know he is still having these symptoms and he does not feel like it is his neuropathy. He states he can still move his toes with neuropathy but he cannot move/control his left hand well.

## 2015-03-13 NOTE — Telephone Encounter (Signed)
Patient is calling. He was seen in our office on 03-08-15 and forgot to tell Dr. Jannifer Franklin something. The patient states when he was released from [facility] his arm and hand was fine then but when the patient got home from the hospital he was exhausted and went into a hard sleep and woke up and his arm and hand was asleep and his arm and hand would not wake up. This has happened several times before the stroke and it would wake up except after coming home from the hospital. He needs to have a call back to discuss. The patient is wondering if this is a pinched nerve. If he cannot be reached at [phone] please try his cell at [phone]. Thank you.

## 2015-03-14 ENCOUNTER — Ambulatory Visit (INDEPENDENT_AMBULATORY_CARE_PROVIDER_SITE_OTHER): Payer: Medicare Other

## 2015-03-14 DIAGNOSIS — I6340 Cerebral infarction due to embolism of unspecified cerebral artery: Secondary | ICD-10-CM

## 2015-03-14 DIAGNOSIS — I480 Paroxysmal atrial fibrillation: Secondary | ICD-10-CM

## 2015-03-24 ENCOUNTER — Other Ambulatory Visit: Payer: Medicare Other

## 2015-03-25 ENCOUNTER — Inpatient Hospital Stay: Admission: RE | Admit: 2015-03-25 | Payer: Medicare Other | Source: Ambulatory Visit

## 2015-04-01 ENCOUNTER — Ambulatory Visit
Admission: RE | Admit: 2015-04-01 | Discharge: 2015-04-01 | Disposition: A | Discharge status: Home / Self Care | Payer: Medicare Other | Source: Ambulatory Visit | Attending: Neurology | Admitting: Neurology

## 2015-04-01 ENCOUNTER — Inpatient Hospital Stay: Admission: RE | Admit: 2015-04-01 | Payer: Medicare Other | Source: Ambulatory Visit

## 2015-04-01 DIAGNOSIS — I6340 Cerebral infarction due to embolism of unspecified cerebral artery: Secondary | ICD-10-CM

## 2015-04-03 ENCOUNTER — Telehealth: Payer: Self-pay | Admitting: Neurology

## 2015-04-03 NOTE — Telephone Encounter (Signed)
I called the patient. The MRI the brain shows 2 areas in the right middle cerebral artery distribution that are "subchronic". This may explain his new symptoms since that original stroke event. The patient has a heart monitor on, he will continue the monitor, I will call when I get the results.   MRI brain 04/03/15:  IMPRESSION: This is an abnormal MRI of the brain without contrast showed the following: 1. Two foci in the deep white matter of the right frontoparietal region consistent with subchronic ischemic strokes in the right MCA distribution.. 2. Extensive number of foci in both hemispheres and a couple small foci in the pons and cerebellum consistent with chronic microvascular ischemic changes. The extent is more than expected for age. 3. Mild to moderate cortical atrophy.

## 2015-04-18 ENCOUNTER — Ambulatory Visit: Payer: Medicare Other | Admitting: Physical Therapy

## 2015-04-18 ENCOUNTER — Ambulatory Visit: Payer: Medicare Other | Admitting: Occupational Therapy

## 2015-04-22 ENCOUNTER — Telehealth: Payer: Self-pay | Admitting: Neurology

## 2015-04-22 NOTE — Telephone Encounter (Signed)
I called the patient. The cardiac monitor study was OK. No change in therapy, stay on aspirin.

## 2015-05-01 ENCOUNTER — Ambulatory Visit: Payer: Medicare Other | Admitting: Occupational Therapy

## 2015-05-01 ENCOUNTER — Ambulatory Visit: Payer: Medicare Other | Admitting: Physical Therapy

## 2015-05-12 ENCOUNTER — Other Ambulatory Visit: Payer: Self-pay | Admitting: Neurology

## 2015-05-24 DIAGNOSIS — G5790 Unspecified mononeuropathy of unspecified lower limb: Secondary | ICD-10-CM | POA: Diagnosis not present

## 2015-05-24 DIAGNOSIS — E1140 Type 2 diabetes mellitus with diabetic neuropathy, unspecified: Secondary | ICD-10-CM | POA: Diagnosis not present

## 2015-05-24 DIAGNOSIS — M792 Neuralgia and neuritis, unspecified: Secondary | ICD-10-CM | POA: Diagnosis not present

## 2015-05-24 DIAGNOSIS — G600 Hereditary motor and sensory neuropathy: Secondary | ICD-10-CM | POA: Diagnosis not present

## 2015-06-05 ENCOUNTER — Ambulatory Visit: Payer: Medicare Other | Admitting: Adult Health

## 2015-06-06 ENCOUNTER — Encounter: Payer: Self-pay | Admitting: Adult Health

## 2015-06-12 ENCOUNTER — Encounter: Payer: Self-pay | Admitting: Adult Health

## 2015-06-12 ENCOUNTER — Ambulatory Visit (INDEPENDENT_AMBULATORY_CARE_PROVIDER_SITE_OTHER): Payer: Medicare Other | Admitting: Adult Health

## 2015-06-12 VITALS — BP 136/86 | HR 88 | Resp 20 | Ht 70.0 in | Wt 213.0 lb

## 2015-06-12 DIAGNOSIS — E1142 Type 2 diabetes mellitus with diabetic polyneuropathy: Secondary | ICD-10-CM

## 2015-06-12 DIAGNOSIS — Z8673 Personal history of transient ischemic attack (TIA), and cerebral infarction without residual deficits: Secondary | ICD-10-CM

## 2015-06-12 NOTE — Progress Notes (Signed)
PATIENT: DRIN KISHIMOTO Sr. DOB: 08-27-1950  REASON FOR VISIT: follow up- diabetic neuropathy, stroke HISTORY FROM: patient  HISTORY OF PRESENT ILLNESS: Mr. Teshima is a 65 year old male with a history of diabetes and diabetic neuropathy as well as stroke. He returns today for follow-up. The patient has remained on aspirin. He denies any additional strokelike symptoms. He reports that his blood pressure has been under control. He has a follow-up visit with his primary care on Thursday. He states that at this visit they will check his hemoglobin A1c and cholesterol. He states that he was told that his previous hemoglobin A1c was excellent. The patient states that he feels that he is 95% back to his baseline after the stroke. He states that he continues to have some issues with the left hand. He states that he no longer drags the left leg.. The patient did not participate in physical therapy or occupational therapy. The patient continues to have burning and tingling sensations in the feet. He states that he no longer has the sharp shooting pains. The patient restarted carbamazepine but is only taken half a tablet twice a day. The patient is also on gabapentin. The patient denies any new neurological symptoms. He returns today for an evaluation.  HISTORY 03/08/15: Mr. Wills is a 65 year old right-handed white male with a history of diabetes and a diabetic peripheral neuropathy. The patient has been followed here for his neuropathic symptoms, currently treated with gabapentin. He indicates that he has at some point gone off of the carbamazepine, he has had increased problems with pain. He is getting electrotherapy with [facility], this has not offered much benefit so far. He comes in today with a new problem. Around 02/17/2015, the patient noted sudden onset of left-sided numbness and weakness. The patient initially went to [facility], and was transferred to [facility]. MRI brain evaluation at  that time showed evidence of a right middle cerebral artery distribution stroke with multiple infarcts in that distribution including the right frontal and parietal lobe. The patient underwent a carotid Doppler study that showed some atherosclerotic changes, no significant stenosis. A 2-D echocardiogram did not show evidence of cardiogenic source of stroke. He underwent a MRA of the head that showed some disease of the right M2 segment, there is some question that this was artifact. The patient had not been on any antiplatelet agent prior to the admission, he was placed on low-dose aspirin. He indicates that he is allergic to Plavix. Since discharge from the hospital, the patient has not received any physical therapy. On 03/06/2015, the patient had some worsening of left-sided symptoms involving the arm with increased weakness and clumsiness. He returned to the hospital, was told that he had a right radial neuropathy. The patient comes to this office for an evaluation. The patient continues to smoke.  REVIEW OF SYSTEMS: Out of a complete 14 system review of symptoms, the patient complains only of the following symptoms, and all other reviewed systems are negative.  Appetite change, fatigue, hearing loss, ringing in ears, runny nose, trouble swallowing, drooling, wheezing, shortness of breath, blurred vision, loss of vision, light sensitivity, eye itching, cold intolerance, heat intolerance, excessive thirst, flushing, restless leg, insomnia, frequent waking, daytime sleepiness, sleep talking, joint pain, joint swelling, back pain, aching muscles, muscle cramps, walking difficulty, neck pain, neck stiffness, urine decrease, urgency, frequency of urination, incontinence of bladder, painful urination, difficulty urinating, memory loss, dizziness, numbness, when weakness, tremors, agitation, confusion, decreased concentration, depression, nervous/anxious  ALLERGIES:  Allergies  Allergen Reactions  . Clopidogrel  Hives  . Levofloxacin Hives  . Cymbalta [Duloxetine Hcl]     Headache, shortness of breath  . Paroxetine Hives  . Terbinafine Hcl Hives    HOME MEDICATIONS: Outpatient Prescriptions Prior to Visit  Medication Sig Dispense Refill  . ALPRAZolam (XANAX) 1 MG tablet Take 1 mg by mouth 3 (three) times daily as needed.      Marland Kitchen amLODipine (NORVASC) 10 MG tablet Take 10 mg by mouth daily.      Marland Kitchen aspirin 81 MG tablet Take 81 mg by mouth daily.    Marland Kitchen atorvastatin (LIPITOR) 80 MG tablet Take 80 mg by mouth daily.    . benazepril (LOTENSIN) 20 MG tablet Take 20 mg by mouth daily.     . betamethasone dipropionate (DIPROLENE) 0.05 % cream Apply 1 application topically daily.     Marland Kitchen buPROPion (WELLBUTRIN) 100 MG tablet TAKE 1 TABLET BY MOUTH EVERY DAY FOR 2 WEEKS, THEN TAKE 1 TABLET BY MOUTH TWICE DAILY 60 tablet 3  . carbamazepine (TEGRETOL) 200 MG tablet TAKE ONE TABLET BY MOUTH TWICE DAILY 60 tablet 3  . clobetasol (OLUX) 0.05 % topical foam Apply 1 application topically 2 (two) times daily.    Marland Kitchen esomeprazole (NEXIUM) 40 MG capsule Take 40 mg by mouth daily at 12 noon.    . etanercept (ENBREL) 50 MG/ML injection Inject 50 mg into the skin once a week.      . fluticasone (FLONASE) 50 MCG/ACT nasal spray Place 2 sprays into the nose daily.     Marland Kitchen gabapentin (NEURONTIN) 800 MG tablet Take 800 mg by mouth 3 (three) times daily.    Marland Kitchen glipiZIDE (GLUCOTROL) 5 MG tablet Take 5 mg by mouth 2 (two) times daily before a meal.     . HYDROcodone-acetaminophen (VICODIN) 5-500 MG per tablet Take 1 tablet by mouth every 6 (six) hours as needed.      . hydrOXYzine (ATARAX/VISTARIL) 25 MG tablet Take 25 mg by mouth every 8 (eight) hours as needed.     Marland Kitchen ketoconazole (NIZORAL) 2 % cream Apply 1 application topically daily as needed.    . lidocaine (XYLOCAINE) 5 % ointment APPLY TO AFFECTED AREA THREE TIMES DAILY AS NEEDED 30 g 1  . metFORMIN (GLUCOPHAGE) 1000 MG tablet Take 1,000 mg by mouth 2 (two) times daily.      .  metoprolol tartrate (LOPRESSOR) 25 MG tablet Take 25 mg by mouth as needed.     . Sildenafil Citrate (VIAGRA PO) Take by mouth daily.      . Tamsulosin HCl (FLOMAX) 0.4 MG CAPS Take 0.4 mg by mouth daily after supper.     . nicotine (NICODERM CQ) 14 mg/24hr patch Place 1 patch (14 mg total) onto the skin daily. 14 patch 0  . nicotine (NICODERM CQ) 21 mg/24hr patch Place 1 patch (21 mg total) onto the skin daily. 42 patch 0  . nicotine (NICODERM CQ) 7 mg/24hr patch Place 1 patch (7 mg total) onto the skin daily. 14 patch 0   No facility-administered medications prior to visit.    PAST MEDICAL HISTORY: Past Medical History  Diagnosis Date  . Dyslipidemia   . Hypertension   . CAD (coronary artery disease)     catheterizzation. 11/10/07. patent LIMA to the LAD with competitive filling, patent SVG to diagonal that enters a bifurcation point, occluded SVG to the distal marginal w/some collateralization, patent SVG to distal posterior lateral. medical therapy recommended.  EF 55%  echo. June,2009. hypokinesis of the mid and distal septal wall. technically difficult study.  . S/P CABG x 18 Sep 2007    2009  . Allergy history, drug     plavix  . Diabetes mellitus     type not specified  . Fibromyalgia   . Psoriatic arthritis ([place])   . H/O alcohol abuse     Alcohol abuse in the past  . Cellulitis     s/p right lower extremity cellulitis  . Arm pain     continued discomfort in his arm- seeing Dr. Annie Main  . ED (erectile dysfunction)   . Ejection fraction     EF 55%, echo, June, 2009, hypokinesis mid/distal septal wall, technically difficult study  . Tobacco abuse   . Claudication [facility])     Arterial Dopplers, July, 2012, normal with normal ABI  . Ringing in ear     June, 2012 /  carotid Dopplers, in July, 2012, 0-39% bilateral  . Carotid artery disease ([place])     Doppler, July, 2012, 0-39% bilateral, followup 2 years  . Drug intolerance     Patient is intolerant to both aspirin and Plavix.    . Glaucoma     right eye  . Polyneuropathy in diabetes(357.2) 10/06/2013  . Stroke due to embolism of cerebral artery ([place]) 03/08/2015    Right MCA    PAST SURGICAL HISTORY: Past Surgical History  Procedure Laterality Date  . Coronary artery bypass graft    . Cataract extraction Right     FAMILY HISTORY: Family History  Problem Relation Age of Onset  . Coronary artery disease      family hx  . Dementia Mother   . Diabetes Mother   . Coronary artery disease Father   . Heart attack Father   . Cancer Brother     SOCIAL HISTORY: Social History   Social History  . Marital Status: Divorced    Spouse Name: Katharine Look  . Number of Children: 2  . Years of Education: HS   Occupational History  . disabled - retired    Social History Main Topics  . Smoking status: Current Every Day Smoker -- 1.25 packs/day for 45 years    Types: Cigarettes    Start date: 05/31/1961  . Smokeless tobacco: Never Used  . Alcohol Use: 0.0 oz/week    0 Standard drinks or equivalent per week     Comment: occasionally  . Drug Use: No  . Sexual Activity: Not on file   Other Topics Concern  . Not on file   Social History Narrative   Patient is right handed patient lives at home with his wife Katharine Look)   Disabled.   Patient drinks about 3 cups of caffeine daily.      PHYSICAL EXAM  Filed Vitals:   06/12/15 0829  BP: 136/86  Pulse: 88  Resp: 20  Height: 5\' 10"  (1.778 m)  Weight: 213 lb (96.616 kg)   Body mass index is 30.56 kg/(m^2).  Generalized: Well developed, in no acute distress   Neurological examination  Mentation: Alert oriented to time, place, history taking. Follows all commands speech and language fluent Cranial nerve II-XII: Pupils were equal round reactive to light. Extraocular movements were full, visual field were full on confrontational test. Facial sensation and strength were normal. Uvula tongue midline. Head turning and shoulder shrug  were normal and  symmetric. Motor: The motor testing reveals 5 over 5 strength of all 4 extremities. Good symmetric motor tone is noted  throughout.  Sensory: Sensory testing is intact to soft touch on all 4 extremities. No evidence of extinction is noted.  Coordination: Cerebellar testing reveals good finger-nose-finger and heel-to-shin bilaterally.  Gait and station: Gait is normal. Tandem gait not attempted Romberg is negative. No drift is seen.  Reflexes: Deep tendon reflexes are symmetric and normal bilaterally.   DIAGNOSTIC DATA (LABS, IMAGING, TESTING) - I reviewed patient records, labs, notes, testing and imaging myself where available.     ASSESSMENT AND PLAN 65 y.o. year old male  has a past medical history of Dyslipidemia; Hypertension; CAD (coronary artery disease); S/P CABG x 1 (may 2009); Allergy history, drug; Diabetes mellitus; Fibromyalgia; Psoriatic arthritis ([place]); H/O alcohol abuse; Cellulitis; Arm pain; ED (erectile dysfunction); Ejection fraction; Tobacco abuse; Claudication ([place]); Ringing in ear; Carotid artery disease ([place]); Drug intolerance; Glaucoma; Polyneuropathy in diabetes(357.2) (10/06/2013); and Stroke due to embolism of cerebral artery ([place]) (03/08/2015). here with:  1. Stroke 2. Diabetic neuropathy  Overall the patient is doing well. He will continue on aspirin for stroke prevention. He should maintain strict control of his blood pressure with goal less than 130/90. His cholesterol LDL less than 100 and hemoglobin A1c less than 6.5%. The patient is encouraged to monitor his diet and exercise daily. The patient will continue on carbamazepine and gabapentin for diabetic neuropathy. He is encouraged to increase his carbamazepine as prescribed to one tablet twice a day. Patient verbalized understanding. He will follow-up in 6 months or sooner if needed.     Ward Givens, MSN, NP-C 06/12/2015, 9:19 AM [facility] [address] [place], [place]  [address] [phone]

## 2015-06-12 NOTE — Patient Instructions (Signed)
Continue Aspirin  Keep Blood pressure < 130/90 Cholesterol LDL< 100 HgA1c <6.5 % Continue Carbamazapine  If your symptoms worsen or you develop new symptoms please let us know.

## 2015-06-12 NOTE — Progress Notes (Signed)
I have read the note, and I agree with the clinical assessment and plan.  Eddie Reed   

## 2015-06-15 DIAGNOSIS — E559 Vitamin D deficiency, unspecified: Secondary | ICD-10-CM | POA: Diagnosis not present

## 2015-06-15 DIAGNOSIS — Z23 Encounter for immunization: Secondary | ICD-10-CM | POA: Diagnosis not present

## 2015-06-15 DIAGNOSIS — E7801 Familial hypercholesterolemia: Secondary | ICD-10-CM | POA: Diagnosis not present

## 2015-06-15 DIAGNOSIS — I10 Essential (primary) hypertension: Secondary | ICD-10-CM | POA: Diagnosis not present

## 2015-06-15 DIAGNOSIS — Z0000 Encounter for general adult medical examination without abnormal findings: Secondary | ICD-10-CM | POA: Diagnosis not present

## 2015-06-15 DIAGNOSIS — E118 Type 2 diabetes mellitus with unspecified complications: Secondary | ICD-10-CM | POA: Diagnosis not present

## 2015-06-15 DIAGNOSIS — Z1211 Encounter for screening for malignant neoplasm of colon: Secondary | ICD-10-CM | POA: Diagnosis not present

## 2015-08-02 DIAGNOSIS — E1151 Type 2 diabetes mellitus with diabetic peripheral angiopathy without gangrene: Secondary | ICD-10-CM | POA: Diagnosis not present

## 2015-08-02 DIAGNOSIS — G600 Hereditary motor and sensory neuropathy: Secondary | ICD-10-CM | POA: Diagnosis not present

## 2015-08-02 DIAGNOSIS — G5790 Unspecified mononeuropathy of unspecified lower limb: Secondary | ICD-10-CM | POA: Diagnosis not present

## 2015-08-02 DIAGNOSIS — E1140 Type 2 diabetes mellitus with diabetic neuropathy, unspecified: Secondary | ICD-10-CM | POA: Diagnosis not present

## 2015-09-11 ENCOUNTER — Other Ambulatory Visit: Payer: Self-pay | Admitting: Neurology

## 2015-10-13 DIAGNOSIS — E1165 Type 2 diabetes mellitus with hyperglycemia: Secondary | ICD-10-CM | POA: Diagnosis not present

## 2015-10-18 DIAGNOSIS — G600 Hereditary motor and sensory neuropathy: Secondary | ICD-10-CM | POA: Diagnosis not present

## 2015-10-18 DIAGNOSIS — M792 Neuralgia and neuritis, unspecified: Secondary | ICD-10-CM | POA: Diagnosis not present

## 2015-10-18 DIAGNOSIS — E1140 Type 2 diabetes mellitus with diabetic neuropathy, unspecified: Secondary | ICD-10-CM | POA: Diagnosis not present

## 2015-10-18 DIAGNOSIS — G5790 Unspecified mononeuropathy of unspecified lower limb: Secondary | ICD-10-CM | POA: Diagnosis not present

## 2015-12-07 ENCOUNTER — Ambulatory Visit: Payer: Medicare Other | Admitting: Adult Health

## 2015-12-08 ENCOUNTER — Encounter: Payer: Self-pay | Admitting: Adult Health

## 2015-12-11 DIAGNOSIS — Z79899 Other long term (current) drug therapy: Secondary | ICD-10-CM | POA: Diagnosis not present

## 2015-12-11 DIAGNOSIS — Z5181 Encounter for therapeutic drug level monitoring: Secondary | ICD-10-CM | POA: Diagnosis not present

## 2015-12-19 DIAGNOSIS — M67912 Unspecified disorder of synovium and tendon, left shoulder: Secondary | ICD-10-CM | POA: Diagnosis not present

## 2015-12-19 DIAGNOSIS — F17200 Nicotine dependence, unspecified, uncomplicated: Secondary | ICD-10-CM | POA: Diagnosis not present

## 2015-12-19 DIAGNOSIS — L4050 Arthropathic psoriasis, unspecified: Secondary | ICD-10-CM | POA: Diagnosis not present

## 2015-12-19 DIAGNOSIS — Z79899 Other long term (current) drug therapy: Secondary | ICD-10-CM | POA: Diagnosis not present

## 2015-12-19 DIAGNOSIS — M170 Bilateral primary osteoarthritis of knee: Secondary | ICD-10-CM | POA: Diagnosis not present

## 2015-12-19 DIAGNOSIS — L408 Other psoriasis: Secondary | ICD-10-CM | POA: Diagnosis not present

## 2015-12-19 DIAGNOSIS — L4059 Other psoriatic arthropathy: Secondary | ICD-10-CM | POA: Diagnosis not present

## 2015-12-19 DIAGNOSIS — M797 Fibromyalgia: Secondary | ICD-10-CM | POA: Diagnosis not present

## 2016-01-03 DIAGNOSIS — G600 Hereditary motor and sensory neuropathy: Secondary | ICD-10-CM | POA: Diagnosis not present

## 2016-01-03 DIAGNOSIS — E1140 Type 2 diabetes mellitus with diabetic neuropathy, unspecified: Secondary | ICD-10-CM | POA: Diagnosis not present

## 2016-01-03 DIAGNOSIS — M792 Neuralgia and neuritis, unspecified: Secondary | ICD-10-CM | POA: Diagnosis not present

## 2016-01-03 DIAGNOSIS — G5790 Unspecified mononeuropathy of unspecified lower limb: Secondary | ICD-10-CM | POA: Diagnosis not present

## 2016-01-08 ENCOUNTER — Ambulatory Visit (INDEPENDENT_AMBULATORY_CARE_PROVIDER_SITE_OTHER): Payer: Medicare Other | Admitting: Adult Health

## 2016-01-08 ENCOUNTER — Other Ambulatory Visit: Payer: Self-pay | Admitting: Neurology

## 2016-01-08 ENCOUNTER — Encounter: Payer: Self-pay | Admitting: Adult Health

## 2016-01-08 VITALS — BP 99/54 | HR 71 | Ht 70.0 in | Wt 200.2 lb

## 2016-01-08 DIAGNOSIS — E1142 Type 2 diabetes mellitus with diabetic polyneuropathy: Secondary | ICD-10-CM | POA: Diagnosis not present

## 2016-01-08 DIAGNOSIS — Z8673 Personal history of transient ischemic attack (TIA), and cerebral infarction without residual deficits: Secondary | ICD-10-CM | POA: Diagnosis not present

## 2016-01-08 MED ORDER — PREGABALIN 50 MG PO CAPS: ORAL_CAPSULE | ORAL | 3 refills | Status: DC

## 2016-01-08 MED ORDER — GABAPENTIN 600 MG PO TABS: 600.0000 mg | ORAL_TABLET | Freq: Four times a day (QID) | ORAL | 5 refills | Status: DC

## 2016-01-08 NOTE — Progress Notes (Signed)
I have read the note, and I agree with the clinical assessment and plan.  Eddie Reed,Eddie Reed   

## 2016-01-08 NOTE — Patient Instructions (Addendum)
Decrease Gabapentin 600 mg tablet- take 1 tablet four times a day for 1 week, then 1 tablet three times a day for 1 week then 1/2 tablet three times a day for 1 week. Then 1/2 tablet twice a day for 1 week then stop.   Start Lyrica 50 mg 1 tablet daily for 1 week then 1 tablet twice a day for 1 week then 1 tablet three times a day. Continue with 1 tablet three times a day.  Pregabalin capsules What is this medicine? PREGABALIN (pre GAB a lin) is used to treat nerve pain from diabetes, shingles, spinal cord injury, and fibromyalgia. It is also used to control seizures in epilepsy. This medicine may be used for other purposes; ask your health care provider or pharmacist if you have questions. What should I tell my health care provider before I take this medicine? They need to know if you have any of these conditions: -bleeding problems -heart disease, including heart failure -history of alcohol or drug abuse -kidney disease -suicidal thoughts, plans, or attempt; a previous suicide attempt by you or a family member -an unusual or allergic reaction to pregabalin, gabapentin, other medicines, foods, dyes, or preservatives -pregnant or trying to get pregnant or trying to conceive with your partner -breast-feeding How should I use this medicine? Take this medicine by mouth with a glass of water. Follow the directions on the prescription label. You can take this medicine with or without food. Take your doses at regular intervals. Do not take your medicine more often than directed. Do not stop taking except on your doctor's advice. A special MedGuide will be given to you by the pharmacist with each prescription and refill. Be sure to read this information carefully each time. Talk to your pediatrician regarding the use of this medicine in children. Special care may be needed. Overdosage: If you think you have taken too much of this medicine contact a poison control center or emergency room at  once. NOTE: This medicine is only for you. Do not share this medicine with others. What if I miss a dose? If you miss a dose, take it as soon as you can. If it is almost time for your next dose, take only that dose. Do not take double or extra doses. What may interact with this medicine? -alcohol -certain medicines for blood pressure like captopril, enalapril, or lisinopril -certain medicines for diabetes, like pioglitazone or rosiglitazone -certain medicines for anxiety or sleep -narcotic medicines for pain This list may not describe all possible interactions. Give your health care provider a list of all the medicines, herbs, non-prescription drugs, or dietary supplements you use. Also tell them if you smoke, drink alcohol, or use illegal drugs. Some items may interact with your medicine. What should I watch for while using this medicine? Tell your doctor or healthcare professional if your symptoms do not start to get better or if they get worse. Visit your doctor or health care professional for regular checks on your progress. Do not stop taking except on your doctor's advice. You may develop a severe reaction. Your doctor will tell you how much medicine to take. Wear a medical identification bracelet or chain if you are taking this medicine for seizures, and carry a card that describes your disease and details of your medicine and dosage times. You may get drowsy or dizzy. Do not drive, use machinery, or do anything that needs mental alertness until you know how this medicine affects you. Do not stand or  sit up quickly, especially if you are an older patient. This reduces the risk of dizzy or fainting spells. Alcohol may interfere with the effect of this medicine. Avoid alcoholic drinks. If you have a heart condition, like congestive heart failure, and notice that you are retaining water and have swelling in your hands or feet, contact your health care provider immediately. The use of this  medicine may increase the chance of suicidal thoughts or actions. Pay special attention to how you are responding while on this medicine. Any worsening of mood, or thoughts of suicide or dying should be reported to your health care professional right away. This medicine has caused reduced sperm counts in some men. This may interfere with the ability to father a child. You should talk to your doctor or health care professional if you are concerned about your fertility. Women who become pregnant while using this medicine for seizures may enroll in the [place] Pregnancy Registry by calling [phone]. This registry collects information about the safety of antiepileptic drug use during pregnancy. What side effects may I notice from receiving this medicine? Side effects that you should report to your doctor or health care professional as soon as possible: -allergic reactions like skin rash, itching or hives, swelling of the face, lips, or tongue -breathing problems -changes in vision -chest pain -confusion -jerking or unusual movements of any part of your body -loss of memory -muscle pain, tenderness, or weakness -suicidal thoughts or other mood changes -swelling of the ankles, feet, hands -unusual bruising or bleeding Side effects that usually do not require medical attention (Report these to your doctor or health care professional if they continue or are bothersome.): -dizziness -drowsiness -dry mouth -headache -nausea -tremors -trouble sleeping -weight gain This list may not describe all possible side effects. Call your doctor for medical advice about side effects. You may report side effects to FDA at 1-800-FDA-1088. Where should I keep my medicine? Keep out of the reach of children. This medicine can be abused. Keep your medicine in a safe place to protect it from theft. Do not share this medicine with anyone. Selling or giving away this medicine is dangerous  and against the law. This medicine may cause accidental overdose and death if it taken by other adults, children, or pets. Mix any unused medicine with a substance like cat litter or coffee grounds. Then throw the medicine away in a sealed container like a sealed bag or a coffee can with a lid. Do not use the medicine after the expiration date. Store at room temperature between 15 and 30 degrees C (59 and 86 degrees F). NOTE: This sheet is a summary. It may not cover all possible information. If you have questions about this medicine, talk to your doctor, pharmacist, or health care provider.    2016, [employer]vier/Gold Standard. (2013-07-02 15:38:53)

## 2016-01-08 NOTE — Progress Notes (Signed)
PATIENT: Eddie ROWLISON Sr. DOB: 1950/06/19  REASON FOR VISIT: follow up- diabetic neuropathy, stroke HISTORY FROM: patient  HISTORY OF PRESENT ILLNESS: Eddie Reed is a 65 year old male with a history of diabetic neuropathy and stroke. He returns today for follow-up. He remains on aspirin and is tolerating it well. His primary care is managing his blood pressure, cholesterol and diabetes. The patient continues to have discomfort in the feet from neuropathy. He takes carbamazepine and gabapentin. Denies any significant changes with his gait although he notices that he is off balance a little more. Denies any recent falls. He states that his podiatrist tizanidine" blocks" on his feet to treat his neuropathy next week. He has tried neurogenics? in the past. He returns today for an evaluation.  HISTORY 06/12/15 (MM): Eddie Reed is a 65 year old male with a history of diabetes and diabetic neuropathy as well as stroke. He returns today for follow-up. The patient has remained on aspirin. He denies any additional strokelike symptoms. He reports that his blood pressure has been under control. He has a follow-up visit with his primary care on Thursday. He states that at this visit they will check his hemoglobin A1c and cholesterol. He states that he was told that his previous hemoglobin A1c was excellent. The patient states that he feels that he is 95% back to his baseline after the stroke. He states that he continues to have some issues with the left hand. He states that he no longer drags the left leg.. The patient did not participate in physical therapy or occupational therapy. The patient continues to have burning and tingling sensations in the feet. He states that he no longer has the sharp shooting pains. The patient restarted carbamazepine but is only taken half a tablet twice a day. The patient is also on gabapentin. The patient denies any new neurological symptoms. He returns today for an  evaluation.  HISTORY 03/08/15: Eddie Reed is a 65 year old right-handed white male with a history of diabetes and a diabetic peripheral neuropathy. The patient has been followed here for his neuropathic symptoms, currently treated with gabapentin. He indicates that he has at some point gone off of the carbamazepine, he has had increased problems with pain. He is getting electrotherapy with [facility], this has not offered much benefit so far. He comes in today with a new problem. Around 02/17/2015, the patient noted sudden onset of left-sided numbness and weakness. The patient initially went to [facility], and was transferred to [facility]. MRI brain evaluation at that time showed evidence of a right middle cerebral artery distribution stroke with multiple infarcts in that distribution including the right frontal and parietal lobe. The patient underwent a carotid Doppler study that showed some atherosclerotic changes, no significant stenosis. A 2-D echocardiogram did not show evidence of cardiogenic source of stroke. He underwent a MRA of the head that showed some disease of the right M2 segment, there is some question that this was artifact. The patient had not been on any antiplatelet agent prior to the admission, he was placed on low-dose aspirin. He indicates that he is allergic to Plavix. Since discharge from the hospital, the patient has not received any physical therapy. On 03/06/2015, the patient had some worsening of left-sided symptoms involving the arm with increased weakness and clumsiness. He returned to the hospital, was told that he had a right radial neuropathy. The patient comes to this office for an evaluation. The patient continues to smoke.   REVIEW OF SYSTEMS:  Out of a complete 14 system review of symptoms, the patient complains only of the following symptoms, and all other reviewed systems are negative.  See history of present illness  ALLERGIES: Allergies  Allergen  Reactions  . Clopidogrel Hives  . Levofloxacin Hives  . Cymbalta [Duloxetine Hcl]     Headache, shortness of breath  . Paroxetine Hives  . Terbinafine Hcl Hives    HOME MEDICATIONS: Outpatient Medications Prior to Visit  Medication Sig Dispense Refill  . ALPRAZolam (XANAX) 1 MG tablet Take 1 mg by mouth 3 (three) times daily as needed.      Marland Kitchen amLODipine (NORVASC) 10 MG tablet Take 10 mg by mouth daily.      Marland Kitchen aspirin 81 MG tablet Take 81 mg by mouth daily.    Marland Kitchen atorvastatin (LIPITOR) 80 MG tablet Take 80 mg by mouth daily.    . benazepril (LOTENSIN) 20 MG tablet Take 20 mg by mouth daily.     . betamethasone dipropionate (DIPROLENE) 0.05 % cream Apply 1 application topically daily.     Marland Kitchen buPROPion (WELLBUTRIN) 100 MG tablet Take 1 tablet (100 mg total) by mouth 2 (two) times daily. 60 tablet 3  . carbamazepine (TEGRETOL) 200 MG tablet TAKE 1 TABLET BY MOUTH TWICE DAILY 60 tablet 3  . clobetasol (OLUX) 0.05 % topical foam Apply 1 application topically 2 (two) times daily.    Marland Kitchen esomeprazole (NEXIUM) 40 MG capsule Take 40 mg by mouth daily at 12 noon.    . etanercept (ENBREL) 50 MG/ML injection Inject 50 mg into the skin once a week.      . fluticasone (FLONASE) 50 MCG/ACT nasal spray Place 2 sprays into the nose daily.     Marland Kitchen gabapentin (NEURONTIN) 800 MG tablet Take 800 mg by mouth 3 (three) times daily.    Marland Kitchen glipiZIDE (GLUCOTROL) 5 MG tablet Take 5 mg by mouth 2 (two) times daily before a meal.     . HYDROcodone-acetaminophen (VICODIN) 5-500 MG per tablet Take 1 tablet by mouth every 6 (six) hours as needed.      . hydrOXYzine (ATARAX/VISTARIL) 25 MG tablet Take 25 mg by mouth every 8 (eight) hours as needed.     Marland Kitchen ketoconazole (NIZORAL) 2 % cream Apply 1 application topically daily as needed.    . lidocaine (XYLOCAINE) 5 % ointment APPLY TO AFFECTED AREA THREE TIMES DAILY AS NEEDED 30 g 1  . metFORMIN (GLUCOPHAGE) 1000 MG tablet Take 1,000 mg by mouth 2 (two) times daily.      .  metoprolol tartrate (LOPRESSOR) 25 MG tablet Take 25 mg by mouth as needed.     . Sildenafil Citrate (VIAGRA PO) Take by mouth daily.      . Tamsulosin HCl (FLOMAX) 0.4 MG CAPS Take 0.4 mg by mouth daily after supper.      No facility-administered medications prior to visit.     PAST MEDICAL HISTORY: Past Medical History:  Diagnosis Date  . Allergy history, drug    plavix  . Arm pain    continued discomfort in his arm- seeing Dr. Annie Main  . CAD (coronary artery disease)    catheterizzation. 11/10/07. patent LIMA to the LAD with competitive filling, patent SVG to diagonal that enters a bifurcation point, occluded SVG to the distal marginal w/some collateralization, patent SVG to distal posterior lateral. medical therapy recommended.  EF 55% echo. June,2009. hypokinesis of the mid and distal septal wall. technically difficult study.  . Carotid artery disease ([place])  Doppler, July, 2012, 0-39% bilateral, followup 2 years  . Cellulitis    s/p right lower extremity cellulitis  . Claudication [facility])    Arterial Dopplers, July, 2012, normal with normal ABI  . Diabetes mellitus    type not specified  . Drug intolerance    Patient is intolerant to both aspirin and Plavix.  Marland Kitchen Dyslipidemia   . ED (erectile dysfunction)   . Ejection fraction    EF 55%, echo, June, 2009, hypokinesis mid/distal septal wall, technically difficult study  . Fibromyalgia   . Glaucoma    right eye  . H/O alcohol abuse    Alcohol abuse in the past  . Hypertension   . Polyneuropathy in diabetes(357.2) 10/06/2013  . Psoriatic arthritis ([place])   . Ringing in ear    June, 2012 /  carotid Dopplers, in July, 2012, 0-39% bilateral  . S/P CABG x 18 Sep 2007   2009  . Stroke due to embolism of cerebral artery ([place]) 03/08/2015   Right MCA  . Tobacco abuse     PAST SURGICAL HISTORY: Past Surgical History:  Procedure Laterality Date  . CATARACT EXTRACTION Right   . CORONARY ARTERY BYPASS GRAFT      FAMILY  HISTORY: Family History  Problem Relation Age of Onset  . Coronary artery disease      family hx  . Dementia Mother   . Diabetes Mother   . Coronary artery disease Father   . Heart attack Father   . Cancer Brother     SOCIAL HISTORY: Social History   Social History  . Marital status: Divorced    Spouse name: Katharine Look  . Number of children: 2  . Years of education: HS   Occupational History  . disabled - retired    Social History Main Topics  . Smoking status: Current Every Day Smoker    Packs/day: 1.25    Years: 45.00    Types: Cigarettes    Start date: 05/31/1961  . Smokeless tobacco: Never Used  . Alcohol use 0.0 oz/week     Comment: occasionally  . Drug use: No  . Sexual activity: Not on file   Other Topics Concern  . Not on file   Social History Narrative   Patient is right handed patient lives at home with his wife Katharine Look)   Disabled.   Patient drinks about 3 cups of caffeine daily.      PHYSICAL EXAM  Vitals:   01/08/16 1126  BP: (!) 99/54  Pulse: 71  Weight: 200 lb 3.2 oz (90.8 kg)  Height: 5\' 10"  (1.778 m)   Body mass index is 28.73 kg/m.  Generalized: Well developed, in no acute distress   Neurological examination  Mentation: Alert oriented to time, place, history taking. Follows all commands speech and language fluent Cranial nerve II-XII: Pupils were equal round reactive to light. Extraocular movements were full, visual field were full on confrontational test. Facial sensation and strength were normal. Uvula tongue midline. Head turning and shoulder shrug  were normal and symmetric. Motor: The motor testing reveals 5 over 5 strength of all 4 extremities. Good symmetric motor tone is noted throughout.  Sensory: Sensory testing is intact to soft touch on all 4 extremities. No evidence of extinction is noted.  Coordination: Cerebellar testing reveals good finger-nose-finger and heel-to-shin bilaterally.  Gait and station: Gait is normal. Tandem  gait is normal. Romberg is negative. No drift is seen.  Reflexes: Deep tendon reflexes are symmetric and normal bilaterally.  DIAGNOSTIC DATA (LABS, IMAGING, TESTING) - I reviewed patient records, labs, notes, testing and imaging myself where available.     Lab Results  Component Value Date   HGBA1C (H) 11/09/2007    7.2 (NOTE)   The ADA recommends the following therapeutic goals for glycemic   control related to Hgb A1C measurement:   Goal of Therapy:   < 7.0% Hgb A1C   Action Suggested:  > 8.0% Hgb A1C   Ref:  Diabetes Care, 61, Suppl. 1, 1999        ASSESSMENT AND PLAN 65 y.o. year old male  has a past medical history of Allergy history, drug; Arm pain; CAD (coronary artery disease); Carotid artery disease ([place]); Cellulitis; Claudication ([place]); Diabetes mellitus; Drug intolerance; Dyslipidemia; ED (erectile dysfunction); Ejection fraction; Fibromyalgia; Glaucoma; H/O alcohol abuse; Hypertension; Polyneuropathy in diabetes(357.2) (10/06/2013); Psoriatic arthritis ([place]); Ringing in ear; S/P CABG x 1 (may 2009); Stroke due to embolism of cerebral artery ([place]) (03/08/2015); and Tobacco abuse. here with:  1. Diabetic neuropathy 2. Stroke   The patient continues to have burning in the lower extremities. He would like to try Lyrica. We will slowly wean the patient off of gabapentin and start Lyrica. I have given the patient weaning instructions for Lyrica and gabapentin.. Patient will continue on aspirin for stroke prevention. Patient advised that if his symptoms worsen or he develops any new symptoms he should let us know. He will follow-up in 3 months or sooner if needed.     Ward Givens, MSN, NP-C 01/08/2016, 11:33 AM [facility] [address] [place], [place] [address] [phone]

## 2016-01-15 DIAGNOSIS — M792 Neuralgia and neuritis, unspecified: Secondary | ICD-10-CM | POA: Diagnosis not present

## 2016-01-15 DIAGNOSIS — G600 Hereditary motor and sensory neuropathy: Secondary | ICD-10-CM | POA: Diagnosis not present

## 2016-01-15 DIAGNOSIS — G5790 Unspecified mononeuropathy of unspecified lower limb: Secondary | ICD-10-CM | POA: Diagnosis not present

## 2016-01-15 DIAGNOSIS — E1140 Type 2 diabetes mellitus with diabetic neuropathy, unspecified: Secondary | ICD-10-CM | POA: Diagnosis not present

## 2016-01-29 DIAGNOSIS — M792 Neuralgia and neuritis, unspecified: Secondary | ICD-10-CM | POA: Diagnosis not present

## 2016-01-29 DIAGNOSIS — E1140 Type 2 diabetes mellitus with diabetic neuropathy, unspecified: Secondary | ICD-10-CM | POA: Diagnosis not present

## 2016-01-29 DIAGNOSIS — G600 Hereditary motor and sensory neuropathy: Secondary | ICD-10-CM | POA: Diagnosis not present

## 2016-01-29 DIAGNOSIS — G5790 Unspecified mononeuropathy of unspecified lower limb: Secondary | ICD-10-CM | POA: Diagnosis not present

## 2016-02-12 DIAGNOSIS — G5790 Unspecified mononeuropathy of unspecified lower limb: Secondary | ICD-10-CM | POA: Diagnosis not present

## 2016-02-12 DIAGNOSIS — E1140 Type 2 diabetes mellitus with diabetic neuropathy, unspecified: Secondary | ICD-10-CM | POA: Diagnosis not present

## 2016-02-12 DIAGNOSIS — G600 Hereditary motor and sensory neuropathy: Secondary | ICD-10-CM | POA: Diagnosis not present

## 2016-02-12 DIAGNOSIS — M792 Neuralgia and neuritis, unspecified: Secondary | ICD-10-CM | POA: Diagnosis not present

## 2016-02-14 DIAGNOSIS — D485 Neoplasm of uncertain behavior of skin: Secondary | ICD-10-CM | POA: Diagnosis not present

## 2016-02-14 DIAGNOSIS — L723 Sebaceous cyst: Secondary | ICD-10-CM | POA: Diagnosis not present

## 2016-02-14 DIAGNOSIS — E1165 Type 2 diabetes mellitus with hyperglycemia: Secondary | ICD-10-CM | POA: Diagnosis not present

## 2016-02-14 DIAGNOSIS — Z23 Encounter for immunization: Secondary | ICD-10-CM | POA: Diagnosis not present

## 2016-02-14 DIAGNOSIS — L720 Epidermal cyst: Secondary | ICD-10-CM | POA: Diagnosis not present

## 2016-02-14 DIAGNOSIS — L570 Actinic keratosis: Secondary | ICD-10-CM | POA: Diagnosis not present

## 2016-02-14 DIAGNOSIS — L409 Psoriasis, unspecified: Secondary | ICD-10-CM | POA: Diagnosis not present

## 2016-02-26 DIAGNOSIS — G600 Hereditary motor and sensory neuropathy: Secondary | ICD-10-CM | POA: Diagnosis not present

## 2016-02-26 DIAGNOSIS — M792 Neuralgia and neuritis, unspecified: Secondary | ICD-10-CM | POA: Diagnosis not present

## 2016-02-26 DIAGNOSIS — G5790 Unspecified mononeuropathy of unspecified lower limb: Secondary | ICD-10-CM | POA: Diagnosis not present

## 2016-02-26 DIAGNOSIS — E1140 Type 2 diabetes mellitus with diabetic neuropathy, unspecified: Secondary | ICD-10-CM | POA: Diagnosis not present

## 2016-03-13 DIAGNOSIS — E1151 Type 2 diabetes mellitus with diabetic peripheral angiopathy without gangrene: Secondary | ICD-10-CM | POA: Diagnosis not present

## 2016-03-13 DIAGNOSIS — B351 Tinea unguium: Secondary | ICD-10-CM | POA: Diagnosis not present

## 2016-03-13 DIAGNOSIS — E1140 Type 2 diabetes mellitus with diabetic neuropathy, unspecified: Secondary | ICD-10-CM | POA: Diagnosis not present

## 2016-03-25 DIAGNOSIS — E1140 Type 2 diabetes mellitus with diabetic neuropathy, unspecified: Secondary | ICD-10-CM | POA: Diagnosis not present

## 2016-03-25 DIAGNOSIS — G600 Hereditary motor and sensory neuropathy: Secondary | ICD-10-CM | POA: Diagnosis not present

## 2016-03-25 DIAGNOSIS — G5790 Unspecified mononeuropathy of unspecified lower limb: Secondary | ICD-10-CM | POA: Diagnosis not present

## 2016-03-25 DIAGNOSIS — M792 Neuralgia and neuritis, unspecified: Secondary | ICD-10-CM | POA: Diagnosis not present

## 2016-03-28 DIAGNOSIS — L720 Epidermal cyst: Secondary | ICD-10-CM | POA: Diagnosis not present

## 2016-03-28 DIAGNOSIS — D485 Neoplasm of uncertain behavior of skin: Secondary | ICD-10-CM | POA: Diagnosis not present

## 2016-04-09 DIAGNOSIS — Z4802 Encounter for removal of sutures: Secondary | ICD-10-CM | POA: Diagnosis not present

## 2016-04-30 DIAGNOSIS — E1140 Type 2 diabetes mellitus with diabetic neuropathy, unspecified: Secondary | ICD-10-CM | POA: Diagnosis not present

## 2016-04-30 DIAGNOSIS — G600 Hereditary motor and sensory neuropathy: Secondary | ICD-10-CM | POA: Diagnosis not present

## 2016-04-30 DIAGNOSIS — M792 Neuralgia and neuritis, unspecified: Secondary | ICD-10-CM | POA: Diagnosis not present

## 2016-04-30 DIAGNOSIS — G5790 Unspecified mononeuropathy of unspecified lower limb: Secondary | ICD-10-CM | POA: Diagnosis not present

## 2016-05-06 ENCOUNTER — Other Ambulatory Visit: Payer: Self-pay | Admitting: Adult Health

## 2016-05-21 DIAGNOSIS — G600 Hereditary motor and sensory neuropathy: Secondary | ICD-10-CM | POA: Diagnosis not present

## 2016-05-21 DIAGNOSIS — G5790 Unspecified mononeuropathy of unspecified lower limb: Secondary | ICD-10-CM | POA: Diagnosis not present

## 2016-05-21 DIAGNOSIS — E1151 Type 2 diabetes mellitus with diabetic peripheral angiopathy without gangrene: Secondary | ICD-10-CM | POA: Diagnosis not present

## 2016-05-21 DIAGNOSIS — E1140 Type 2 diabetes mellitus with diabetic neuropathy, unspecified: Secondary | ICD-10-CM | POA: Diagnosis not present

## 2016-05-28 DIAGNOSIS — M792 Neuralgia and neuritis, unspecified: Secondary | ICD-10-CM | POA: Diagnosis not present

## 2016-05-28 DIAGNOSIS — G600 Hereditary motor and sensory neuropathy: Secondary | ICD-10-CM | POA: Diagnosis not present

## 2016-05-28 DIAGNOSIS — G5790 Unspecified mononeuropathy of unspecified lower limb: Secondary | ICD-10-CM | POA: Diagnosis not present

## 2016-05-28 DIAGNOSIS — E1140 Type 2 diabetes mellitus with diabetic neuropathy, unspecified: Secondary | ICD-10-CM | POA: Diagnosis not present

## 2016-06-07 ENCOUNTER — Ambulatory Visit: Payer: Medicare Other | Admitting: Neurology

## 2016-06-07 ENCOUNTER — Telehealth: Payer: Self-pay | Admitting: Neurology

## 2016-06-07 NOTE — Telephone Encounter (Signed)
Called and left mssg for pt to call back to r/s missed appt today d/t weather.

## 2016-06-07 NOTE — Telephone Encounter (Signed)
This patient did not show for a revisit appointment today. 

## 2016-06-17 ENCOUNTER — Encounter: Payer: Self-pay | Admitting: *Deleted

## 2016-06-17 ENCOUNTER — Ambulatory Visit: Payer: Medicare Other | Admitting: Cardiology

## 2016-06-17 NOTE — Progress Notes (Deleted)
Clinical Summary Eddie Reed is a 66 y.o.male    1. CAD - history of prior CABG. Last cath 2009 as described below, patent grafts other than occluded SVG-marginal however there were collaterals and medically managed - reports allergy to plavix. Reports ASA upsets stomach. Recently back on ASA and having some burning feeling at times in epigastrium.  - denies any chest pain. Notes some SOB at times, better with inhaler.  -compliant with meds  2. Hyperlipidemia - compliant with statin  3. DM2 - followed by pcp  4. Carotid stenosis - most recent US 02/2015 without significant obstructive disease   5. CVA - presented with acute left sided weakness to [place]. Did not receive tPA,  transferred to [facility] - MRI of the brain without contrast on showed Multiple small acute/recent embolic type infarcts within the MCA territory of the right frontal and parietal lobes with no associated hemorrhage or mass effect - Echocardiogram with Doppler complete with bubble study was obtained on 02/18/15, showed no cardiac etiology for the stroke.Left ventricular ejection fraction was 55 %. - reports weakness has resolved.  - has f/u pending with neuro   6. Tobacco abuse - uses prn albuterol only.  - tobacco x 50+ years. Denies ever having PFTs    Past Medical History:  Diagnosis Date  . Allergy history, drug    plavix  . Arm pain    continued discomfort in his arm- seeing Dr. Annie Main  . CAD (coronary artery disease)    catheterizzation. 11/10/07. patent LIMA to the LAD with competitive filling, patent SVG to diagonal that enters a bifurcation point, occluded SVG to the distal marginal w/some collateralization, patent SVG to distal posterior lateral. medical therapy recommended.  EF 55% echo. June,2009. hypokinesis of the mid and distal septal wall. technically difficult study.  . Carotid artery disease ([place])    Doppler, July, 2012, 0-39% bilateral, followup 2 years  .  Cellulitis    s/p right lower extremity cellulitis  . Claudication [facility])    Arterial Dopplers, July, 2012, normal with normal ABI  . Diabetes mellitus    type not specified  . Drug intolerance    Patient is intolerant to both aspirin and Plavix.  Marland Kitchen Dyslipidemia   . ED (erectile dysfunction)   . Ejection fraction    EF 55%, echo, June, 2009, hypokinesis mid/distal septal wall, technically difficult study  . Fibromyalgia   . Glaucoma    right eye  . H/O alcohol abuse    Alcohol abuse in the past  . Hypertension   . Polyneuropathy in diabetes(357.2) 10/06/2013  . Psoriatic arthritis ([place])   . Ringing in ear    June, 2012 /  carotid Dopplers, in July, 2012, 0-39% bilateral  . S/P CABG x 18 Sep 2007   2009  . Stroke due to embolism of cerebral artery ([place]) 03/08/2015   Right MCA  . Tobacco abuse      Allergies  Allergen Reactions  . Clopidogrel Hives  . Levofloxacin Hives  . Cymbalta [Duloxetine Hcl]     Headache, shortness of breath  . Paroxetine Hives  . Terbinafine Hcl Hives     Current Outpatient Prescriptions  Medication Sig Dispense Refill  . ALPRAZolam (XANAX) 1 MG tablet Take 1 mg by mouth 3 (three) times daily as needed.      Marland Kitchen amLODipine (NORVASC) 10 MG tablet Take 10 mg by mouth daily.      Marland Kitchen aspirin 81 MG tablet Take 81 mg  by mouth daily.    Marland Kitchen atorvastatin (LIPITOR) 80 MG tablet Take 80 mg by mouth daily.    . benazepril (LOTENSIN) 20 MG tablet Take 20 mg by mouth daily.     . betamethasone dipropionate (DIPROLENE) 0.05 % cream Apply 1 application topically daily.     Marland Kitchen buPROPion (WELLBUTRIN) 100 MG tablet TAKE 1 TABLET BY MOUTH TWICE DAILY 60 tablet 3  . carbamazepine (TEGRETOL) 200 MG tablet TAKE 1/2 TABLET BY MOUTH 2 TIMES DAILY. 30 tablet 3  . clobetasol (OLUX) 0.05 % topical foam Apply 1 application topically 2 (two) times daily.    Marland Kitchen esomeprazole (NEXIUM) 40 MG capsule Take 40 mg by mouth daily at 12 noon.    . etanercept (ENBREL) 50 MG/ML injection  Inject 50 mg into the skin once a week.      . fluticasone (FLONASE) 50 MCG/ACT nasal spray Place 2 sprays into the nose daily.     Marland Kitchen gabapentin (NEURONTIN) 600 MG tablet Take 1 tablet (600 mg total) by mouth 4 (four) times daily. 120 tablet 5  . glipiZIDE (GLUCOTROL) 5 MG tablet Take 5 mg by mouth 2 (two) times daily before a meal.     . HYDROcodone-acetaminophen (VICODIN) 5-500 MG per tablet Take 1 tablet by mouth every 6 (six) hours as needed.      . hydrOXYzine (ATARAX/VISTARIL) 25 MG tablet Take 25 mg by mouth every 8 (eight) hours as needed.     Marland Kitchen ketoconazole (NIZORAL) 2 % cream Apply 1 application topically daily as needed.    . lidocaine (XYLOCAINE) 5 % ointment APPLY TO AFFECTED AREA THREE TIMES DAILY AS NEEDED 30 g 1  . metFORMIN (GLUCOPHAGE) 1000 MG tablet Take 1,000 mg by mouth 2 (two) times daily.      . metoprolol tartrate (LOPRESSOR) 25 MG tablet Take 25 mg by mouth as needed.     . pregabalin (LYRICA) 50 MG capsule Take 1 tablet daily for 1 week, then 1 tablet twice a day for 1 week and 1 tablet TID thereafter. 90 capsule 3  . Sildenafil Citrate (VIAGRA PO) Take by mouth daily.      . Tamsulosin HCl (FLOMAX) 0.4 MG CAPS Take 0.4 mg by mouth daily after supper.      No current facility-administered medications for this visit.      Past Surgical History:  Procedure Laterality Date  . CATARACT EXTRACTION Right   . CORONARY ARTERY BYPASS GRAFT       Allergies  Allergen Reactions  . Clopidogrel Hives  . Levofloxacin Hives  . Cymbalta [Duloxetine Hcl]     Headache, shortness of breath  . Paroxetine Hives  . Terbinafine Hcl Hives      Family History  Problem Relation Age of Onset  . Dementia Mother   . Diabetes Mother   . Coronary artery disease Father   . Heart attack Father   . Cancer Brother   . Coronary artery disease      family hx     Social History [name] reports that he has been smoking Cigarettes.  He started smoking about 55 years ago. He has  a 56.25 pack-year smoking history. He has never used smokeless tobacco. [name] reports that he drinks alcohol.   Review of Systems CONSTITUTIONAL: No weight loss, fever, chills, weakness or fatigue.  HEENT: Eyes: No visual loss, blurred vision, double vision or yellow sclerae.No hearing loss, sneezing, congestion, runny nose or sore throat.  SKIN: No rash or itching.  CARDIOVASCULAR:  RESPIRATORY: No shortness of breath, cough or sputum.  GASTROINTESTINAL: No anorexia, nausea, vomiting or diarrhea. No abdominal pain or blood.  GENITOURINARY: No burning on urination, no polyuria NEUROLOGICAL: No headache, dizziness, syncope, paralysis, ataxia, numbness or tingling in the extremities. No change in bowel or bladder control.  MUSCULOSKELETAL: No muscle, back pain, joint pain or stiffness.  LYMPHATICS: No enlarged nodes. No history of splenectomy.  PSYCHIATRIC: No history of depression or anxiety.  ENDOCRINOLOGIC: No reports of sweating, cold or heat intolerance. No polyuria or polydipsia.  Marland Kitchen   Physical Examination There were no vitals filed for this visit. There were no vitals filed for this visit.  Gen: resting comfortably, no acute distress HEENT: no scleral icterus, pupils equal round and reactive, no palptable cervical adenopathy,  CV Resp: Clear to auscultation bilaterally GI: abdomen is soft, non-tender, non-distended, normal bowel sounds, no hepatosplenomegaly MSK: extremities are warm, no edema.  Skin: warm, no rash Neuro:  no focal deficits Psych: appropriate affect   Diagnostic Studies 09/2014 Carotid US Bilateral 1-39% carotid stenosis  10/2007 echo  SUMMARY - Overall left ventricular systolic function was normal. Left    ventricular ejection fraction was estimated to be 55 %.    Findings were suggestive of hypokinesis of the mid-distal    septal wall. Left ventricular wall thickness was mildly    increased.   10/2007 CathHEMODYNAMIC DATA:  Central aortic pressure was 106/73, mean 88.  ANGIOGRAPHIC DATA: 1. The left main was free of critical disease. 2. The LAD coursed to the apex. The LAD has been stented in the acute  setting prior to a surgery. There is flow down the LAD and what  appears to be competitive flow after a septal perforator. The  stent has mild luminal irregularities, but is intact. It is a bare-  metal stent. There is competitive filling in the distal vessel.  Importantly, injection of the internal mammary reveals an internal  mammary that is patent. There is perhaps a 30-40% area of bending  in the vessel, and the distal LAD does not fill through the  internal mammary nor does you see it well from the graft, although  there is clear evidence of the competitive filling of the distal  LAD territory. The major diagonal Eddie Reed has 80% narrowing and  then 90% narrowing then bifurcates. One bifurcation has  substantial disease and the graft appears to enter right just  beyond this or in the other Eddie Reed where there is evidence of clear-  cut competitive filling. 3. The saphenous vein graft to the diagonal Eddie Reed is widely patent.  There is slow runoff of the graft, but that is due to size  mismatch. 4. There is a small ramus intermedius that appears to be subtotally  occluded. There is what appears to be competitive filling distally  and this is likely due to collateralization. Likewise, the  circumflex is totally occluded and the OM graft is totally  occluded. 5. The right coronary artery has multiple lesions of 50% after a  stent. There is a 30% lesion and some diffuse luminal  irregularities of 30-40% distally. There is then an 80% stenosis  after a small PDA and then evidence of competitive filling  distally. There is a vein graft to the PDA that appears to be  angiographically  intact.  CONCLUSION: 1. Continued patency of the left internal mammary to the LAD, but with  evidence of what appears to be some competitive filling. 2. Patent saphenous vein graft  to a diagonal that enters at a  bifurcation point. 3. Occluded saphenous vein graft to the distal marginal with at least  collateralization of what is likely the ramus and/or circumflex  itself. 4. Continued patency of the saphenous vein graft to the distal  posterolateral segment.  Cath DISPOSITION: At the present time, cardiac rehab and medical therapy will be recommended.   02/2015 [facility] Carotid US IMPRESSION: Bilateral ASVD. No significant stenosis.    Stenosis is validated by velocity measurements with corresponding angiographic measurements and velocity criteria extrapolated in a process based on that defined by the Society of Radiologists in Ultrasound.   02/2015 [facility] Echo Interpretation Summary  A complete portable two-dimensional transthoracic echocardiogram was  performed. The study was technically difficult. The left ventricle is  mildly dilated.  There is mild concentric left ventricular hypertrophy.  The aortic valve is trileaflet.  There is anterior wall hypokinesis.  Unable to adequately determine diastolic dysfunction.  The left atrium is mildly dilated.  The left ventricular ejection fraction is normal (50-55%).    Left Ventricle  The left ventricle is mildly dilated. There is mild concentric left  ventricular hypertrophy. There is anterior wall hypokinesis. The left  ventricular ejection fraction is normal (50-55%). Unable to adequately  determine diastolic dysfunction.      Right Ventricle  The right ventricle is not well visualized. The right ventricle is grossly  normal in size and function.    Atria  The left atrium is mildly dilated. The right atrium is normal. The IVC is  normal in size.    Mitral  Valve  There is mild mitral annular calcification. There is no mitral  regurgitation.      Tricuspid Valve  The tricuspid valve is grossly normal. There is no tricuspid regurgitation.    Aortic Valve  The aortic valve opens well. The aortic valve is trileaflet. There is no  aortic regurgitation present.    Pulmonic Valve  The pulmonic valve is not well visualized.    Vessels  The aortic root is normal in diameter.    Pericardium  There is no pericardial effusion.    MMode/2D Measurements & Calculations  IVSd: 1.3 cmLVIDd: 5.0 cm  LVIDs: 3.6 cm  LVPWd: 1.2 cm    _____________________________________________________________  LV mass(C)d: 257.0 gramsAo root diam: 3.7 cm    LV mass(C)dI: 120.6 grams/m2Ao root area: 10.6 cm2  LA dimension: 4.1 cm    Doppler Measurements & Calculations  MV E max vel: 90.8 cm/secMV dec time: 0.26 sec  MV A max vel: 74.0 cm/sec  MV E/A: 1.2        Assessment and Plan  1. CAD - no current symptoms, continue current meds  2. Hyperipidemia - continue high dose statin in setting of known CAD  3. Carotid stenosis - no significant obstructive disease by most recent US, continue to follow clinically  4. CVA - recent CVA with admission to [facility]. Plavix allergy, history of stomach upset on ASA. Back on ASA s/p CVA, noting some burning epigastic pain at times. Will start nexium  5. Tobacco abuse - will give Rx for nicotine patch  6. SOB - SOB at times better with albuterol. Never had formal PFTs, will order   F/u 4 months       Arnoldo Lenis, M.D., F.A.C.C.

## 2016-06-18 DIAGNOSIS — Z6827 Body mass index (BMI) 27.0-27.9, adult: Secondary | ICD-10-CM | POA: Diagnosis not present

## 2016-06-18 DIAGNOSIS — G629 Polyneuropathy, unspecified: Secondary | ICD-10-CM | POA: Diagnosis not present

## 2016-06-18 DIAGNOSIS — I10 Essential (primary) hypertension: Secondary | ICD-10-CM | POA: Diagnosis not present

## 2016-06-18 DIAGNOSIS — R351 Nocturia: Secondary | ICD-10-CM | POA: Diagnosis not present

## 2016-06-18 DIAGNOSIS — N529 Male erectile dysfunction, unspecified: Secondary | ICD-10-CM | POA: Diagnosis not present

## 2016-06-18 DIAGNOSIS — Z0000 Encounter for general adult medical examination without abnormal findings: Secondary | ICD-10-CM | POA: Diagnosis not present

## 2016-06-18 DIAGNOSIS — E291 Testicular hypofunction: Secondary | ICD-10-CM | POA: Diagnosis not present

## 2016-06-18 DIAGNOSIS — Z1211 Encounter for screening for malignant neoplasm of colon: Secondary | ICD-10-CM | POA: Diagnosis not present

## 2016-06-18 DIAGNOSIS — N401 Enlarged prostate with lower urinary tract symptoms: Secondary | ICD-10-CM | POA: Diagnosis not present

## 2016-06-18 DIAGNOSIS — E7801 Familial hypercholesterolemia: Secondary | ICD-10-CM | POA: Diagnosis not present

## 2016-06-18 DIAGNOSIS — E1165 Type 2 diabetes mellitus with hyperglycemia: Secondary | ICD-10-CM | POA: Diagnosis not present

## 2016-07-03 ENCOUNTER — Telehealth: Payer: Self-pay | Admitting: Cardiology

## 2016-07-03 NOTE — Telephone Encounter (Signed)
Numerous attempts to contact patient with recall letters. Unable to reach by telephone. with no success.    Chanda Busing [id number] 09/13/2015 8:37 AM New [10]   Lynnda Child Slaughter [id number] 09/13/2015 8:37 AM Notification Sent [20]   Weston Anna [id number] 10/31/2015 1:07 PM Notification Sent [20]   Weston Anna [id number] 02/14/2016 10:31 AM Notification Sent [20]   Weston Anna [id number] 05/07/2016 2:21 PM Notification Sent [20

## 2016-08-01 DIAGNOSIS — G600 Hereditary motor and sensory neuropathy: Secondary | ICD-10-CM | POA: Diagnosis not present

## 2016-08-01 DIAGNOSIS — E1151 Type 2 diabetes mellitus with diabetic peripheral angiopathy without gangrene: Secondary | ICD-10-CM | POA: Diagnosis not present

## 2016-08-01 DIAGNOSIS — G5790 Unspecified mononeuropathy of unspecified lower limb: Secondary | ICD-10-CM | POA: Diagnosis not present

## 2016-08-01 DIAGNOSIS — E1140 Type 2 diabetes mellitus with diabetic neuropathy, unspecified: Secondary | ICD-10-CM | POA: Diagnosis not present

## 2016-08-05 ENCOUNTER — Other Ambulatory Visit: Payer: Self-pay | Admitting: *Deleted

## 2016-08-05 ENCOUNTER — Other Ambulatory Visit: Payer: Self-pay | Admitting: Adult Health

## 2016-08-05 NOTE — Telephone Encounter (Signed)
Last seen 01-04-2016.  Needs RV for additional refills. (give 30 day supply)

## 2016-08-06 MED ORDER — PREGABALIN 50 MG PO CAPS: ORAL_CAPSULE | ORAL | 0 refills | Status: DC

## 2016-08-06 NOTE — Telephone Encounter (Signed)
Fax confirmation received [facility]. [phone].sy

## 2016-08-12 DIAGNOSIS — M79672 Pain in left foot: Secondary | ICD-10-CM | POA: Diagnosis not present

## 2016-08-12 DIAGNOSIS — M792 Neuralgia and neuritis, unspecified: Secondary | ICD-10-CM | POA: Diagnosis not present

## 2016-08-12 DIAGNOSIS — M25579 Pain in unspecified ankle and joints of unspecified foot: Secondary | ICD-10-CM | POA: Diagnosis not present

## 2016-08-12 DIAGNOSIS — G5790 Unspecified mononeuropathy of unspecified lower limb: Secondary | ICD-10-CM | POA: Diagnosis not present

## 2016-08-12 DIAGNOSIS — G600 Hereditary motor and sensory neuropathy: Secondary | ICD-10-CM | POA: Diagnosis not present

## 2016-08-12 DIAGNOSIS — M79671 Pain in right foot: Secondary | ICD-10-CM | POA: Diagnosis not present

## 2016-08-12 DIAGNOSIS — E1140 Type 2 diabetes mellitus with diabetic neuropathy, unspecified: Secondary | ICD-10-CM | POA: Diagnosis not present

## 2016-09-08 ENCOUNTER — Other Ambulatory Visit: Payer: Self-pay | Admitting: Adult Health

## 2016-09-16 DIAGNOSIS — M79671 Pain in right foot: Secondary | ICD-10-CM | POA: Diagnosis not present

## 2016-09-16 DIAGNOSIS — M79672 Pain in left foot: Secondary | ICD-10-CM | POA: Diagnosis not present

## 2016-09-16 DIAGNOSIS — M25579 Pain in unspecified ankle and joints of unspecified foot: Secondary | ICD-10-CM | POA: Diagnosis not present

## 2016-09-27 ENCOUNTER — Other Ambulatory Visit: Payer: Self-pay | Admitting: Adult Health

## 2016-10-03 ENCOUNTER — Other Ambulatory Visit: Payer: Self-pay | Admitting: Adult Health

## 2016-10-04 NOTE — Telephone Encounter (Signed)
Has appt 11-11-16 at 230pm with MM/NP.

## 2016-10-08 NOTE — Telephone Encounter (Signed)
Fax confirmation received lyrica [name] [phone]. sy

## 2016-10-17 DIAGNOSIS — E1140 Type 2 diabetes mellitus with diabetic neuropathy, unspecified: Secondary | ICD-10-CM | POA: Diagnosis not present

## 2016-10-17 DIAGNOSIS — G600 Hereditary motor and sensory neuropathy: Secondary | ICD-10-CM | POA: Diagnosis not present

## 2016-10-17 DIAGNOSIS — G5790 Unspecified mononeuropathy of unspecified lower limb: Secondary | ICD-10-CM | POA: Diagnosis not present

## 2016-10-17 DIAGNOSIS — E1151 Type 2 diabetes mellitus with diabetic peripheral angiopathy without gangrene: Secondary | ICD-10-CM | POA: Diagnosis not present

## 2016-11-01 DIAGNOSIS — M170 Bilateral primary osteoarthritis of knee: Secondary | ICD-10-CM | POA: Diagnosis not present

## 2016-11-01 DIAGNOSIS — Z7984 Long term (current) use of oral hypoglycemic drugs: Secondary | ICD-10-CM | POA: Diagnosis not present

## 2016-11-01 DIAGNOSIS — Z79899 Other long term (current) drug therapy: Secondary | ICD-10-CM | POA: Diagnosis not present

## 2016-11-01 DIAGNOSIS — L4050 Arthropathic psoriasis, unspecified: Secondary | ICD-10-CM | POA: Diagnosis not present

## 2016-11-01 DIAGNOSIS — F17210 Nicotine dependence, cigarettes, uncomplicated: Secondary | ICD-10-CM | POA: Diagnosis not present

## 2016-11-01 DIAGNOSIS — E1140 Type 2 diabetes mellitus with diabetic neuropathy, unspecified: Secondary | ICD-10-CM | POA: Diagnosis not present

## 2016-11-05 ENCOUNTER — Other Ambulatory Visit: Payer: Self-pay | Admitting: Adult Health

## 2016-11-11 ENCOUNTER — Ambulatory Visit: Payer: Medicare Other | Admitting: Adult Health

## 2016-11-12 ENCOUNTER — Encounter: Payer: Self-pay | Admitting: Adult Health

## 2016-11-25 ENCOUNTER — Other Ambulatory Visit: Payer: Self-pay | Admitting: Adult Health

## 2016-12-04 ENCOUNTER — Other Ambulatory Visit: Payer: Self-pay | Admitting: Adult Health

## 2016-12-31 DIAGNOSIS — G629 Polyneuropathy, unspecified: Secondary | ICD-10-CM | POA: Diagnosis not present

## 2016-12-31 DIAGNOSIS — I10 Essential (primary) hypertension: Secondary | ICD-10-CM | POA: Diagnosis not present

## 2016-12-31 DIAGNOSIS — L4050 Arthropathic psoriasis, unspecified: Secondary | ICD-10-CM | POA: Diagnosis not present

## 2016-12-31 DIAGNOSIS — M797 Fibromyalgia: Secondary | ICD-10-CM | POA: Diagnosis not present

## 2016-12-31 DIAGNOSIS — F419 Anxiety disorder, unspecified: Secondary | ICD-10-CM | POA: Diagnosis not present

## 2016-12-31 DIAGNOSIS — E782 Mixed hyperlipidemia: Secondary | ICD-10-CM | POA: Diagnosis not present

## 2016-12-31 DIAGNOSIS — N401 Enlarged prostate with lower urinary tract symptoms: Secondary | ICD-10-CM | POA: Diagnosis not present

## 2016-12-31 DIAGNOSIS — E1165 Type 2 diabetes mellitus with hyperglycemia: Secondary | ICD-10-CM | POA: Diagnosis not present

## 2016-12-31 DIAGNOSIS — M545 Low back pain: Secondary | ICD-10-CM | POA: Diagnosis not present

## 2017-01-02 DIAGNOSIS — E1151 Type 2 diabetes mellitus with diabetic peripheral angiopathy without gangrene: Secondary | ICD-10-CM | POA: Diagnosis not present

## 2017-01-02 DIAGNOSIS — G5790 Unspecified mononeuropathy of unspecified lower limb: Secondary | ICD-10-CM | POA: Diagnosis not present

## 2017-01-02 DIAGNOSIS — E1140 Type 2 diabetes mellitus with diabetic neuropathy, unspecified: Secondary | ICD-10-CM | POA: Diagnosis not present

## 2017-01-02 DIAGNOSIS — G600 Hereditary motor and sensory neuropathy: Secondary | ICD-10-CM | POA: Diagnosis not present

## 2017-02-17 ENCOUNTER — Encounter: Payer: Self-pay | Admitting: *Deleted

## 2017-02-17 ENCOUNTER — Ambulatory Visit: Payer: Medicare Other | Admitting: Cardiology

## 2017-02-18 ENCOUNTER — Ambulatory Visit (INDEPENDENT_AMBULATORY_CARE_PROVIDER_SITE_OTHER): Payer: Medicare Other | Admitting: Cardiology

## 2017-02-18 ENCOUNTER — Encounter: Payer: Self-pay | Admitting: Cardiology

## 2017-02-18 ENCOUNTER — Telehealth: Payer: Self-pay | Admitting: Cardiology

## 2017-02-18 ENCOUNTER — Encounter: Payer: Self-pay | Admitting: *Deleted

## 2017-02-18 VITALS — BP 144/90 | HR 76 | Ht 70.0 in | Wt 208.0 lb

## 2017-02-18 DIAGNOSIS — R0602 Shortness of breath: Secondary | ICD-10-CM

## 2017-02-18 DIAGNOSIS — I2510 Atherosclerotic heart disease of native coronary artery without angina pectoris: Secondary | ICD-10-CM | POA: Diagnosis not present

## 2017-02-18 DIAGNOSIS — I10 Essential (primary) hypertension: Secondary | ICD-10-CM | POA: Diagnosis not present

## 2017-02-18 DIAGNOSIS — E782 Mixed hyperlipidemia: Secondary | ICD-10-CM

## 2017-02-18 MED ORDER — METOPROLOL TARTRATE 25 MG PO TABS: 25.0000 mg | ORAL_TABLET | Freq: Two times a day (BID) | ORAL | 1 refills | Status: DC

## 2017-02-18 MED ORDER — ATORVASTATIN CALCIUM 40 MG PO TABS
40.0000 mg | ORAL_TABLET | Freq: Every day | ORAL | 1 refills | Status: DC
Start: 2017-02-18 — End: 2017-08-21

## 2017-02-18 NOTE — Patient Instructions (Signed)
Your physician wants you to follow-up in: [place] will receive a reminder letter in the mail two months in advance. If you don't receive a letter, please call our office to schedule the follow-up appointment.  Your physician has recommended you make the following change in your medication:   CHANGE METOPROLOL 25 MG TWICE DAILY  DECREASE ATORVASTATIN 40 MG DAILY  Your physician has recommended that you have a pulmonary function test. Pulmonary Function Tests are a group of tests that measure how well air moves in and out of your lungs.  Thank you for choosing [facility]!!

## 2017-02-18 NOTE — Telephone Encounter (Signed)
PFT schedule at [facility] Feb 28, 2017 at 2:30

## 2017-02-18 NOTE — Progress Notes (Addendum)
Clinical Summary Eddie Reed is a 66 y.o.male seen today for follow up of the following medical problems.   1. CAD - history of prior CABG. Last cath 2009 as described below, patent grafts other than occluded SVG-marginal however there were collaterals and medically managed - reports allergy to plavix. Reports ASA upsets stomach. Recently back on ASA and having some burning feeling at times in epigastrium. Has tolerated combined with PPI.    - no recent chest pain. Can have some pain at times after eating. Chronic DOE that is stable - compliant with meds.   2. Hyperlipidemia - mixed compliance with lipitor, he reports it causes tinnitus.   3. DM2 - followed by pcp   4. History of CVA - presented with acute left sided weakness to [place]. Did not receive tPA,  transferred to [facility] - MRI of the brain without contrast on showed Multiple small acute/recent embolic type infarcts within the MCA territory of the right frontal and parietal lobes with no associated hemorrhage or mass effect - Echocardiogram with Doppler complete with bubble study was obtained on 02/18/15, showed no cardiac etiology for the stroke.Left ventricular ejection fraction was 55 %. - reports weakness has resolved.  - has f/u pending with neuro  5. SOB - long smoking history, never had PFTs.     Past Medical History:  Diagnosis Date  . Allergy history, drug    plavix  . Arm pain    continued discomfort in his arm- seeing Dr. Annie Main  . CAD (coronary artery disease)    catheterizzation. 11/10/07. patent LIMA to the LAD with competitive filling, patent SVG to diagonal that enters a bifurcation point, occluded SVG to the distal marginal w/some collateralization, patent SVG to distal posterior lateral. medical therapy recommended.  EF 55% echo. June,2009. hypokinesis of the mid and distal septal wall. technically difficult study.  . Carotid artery disease ([place])    Doppler, July, 2012, 0-39%  bilateral, followup 2 years  . Cellulitis    s/p right lower extremity cellulitis  . Claudication [facility])    Arterial Dopplers, July, 2012, normal with normal ABI  . Diabetes mellitus    type not specified  . Drug intolerance    Patient is intolerant to both aspirin and Plavix.  Marland Kitchen Dyslipidemia   . ED (erectile dysfunction)   . Ejection fraction    EF 55%, echo, June, 2009, hypokinesis mid/distal septal wall, technically difficult study  . Fibromyalgia   . Glaucoma    right eye  . H/O alcohol abuse    Alcohol abuse in the past  . Hypertension   . Polyneuropathy in diabetes(357.2) 10/06/2013  . Psoriatic arthritis ([place])   . Ringing in ear    June, 2012 /  carotid Dopplers, in July, 2012, 0-39% bilateral  . S/P CABG x 18 Sep 2007   2009  . Stroke due to embolism of cerebral artery ([place]) 03/08/2015   Right MCA  . Tobacco abuse      Allergies  Allergen Reactions  . Clopidogrel Hives  . Levofloxacin Hives  . Cymbalta [Duloxetine Hcl]     Headache, shortness of breath  . Paroxetine Hives  . Terbinafine Hcl Hives     Current Outpatient Prescriptions  Medication Sig Dispense Refill  . ALPRAZolam (XANAX) 1 MG tablet Take 1 mg by mouth 3 (three) times daily as needed.      Marland Kitchen amLODipine (NORVASC) 10 MG tablet Take 10 mg by mouth daily.      Marland Kitchen  aspirin 81 MG tablet Take 81 mg by mouth daily.    Marland Kitchen atorvastatin (LIPITOR) 80 MG tablet Take 80 mg by mouth daily.    . benazepril (LOTENSIN) 20 MG tablet Take 20 mg by mouth daily.     . betamethasone dipropionate (DIPROLENE) 0.05 % cream Apply 1 application topically daily.     Marland Kitchen buPROPion (WELLBUTRIN) 100 MG tablet TAKE 1 TABLET BY MOUTH TWICE DAILY 60 tablet 0  . carbamazepine (TEGRETOL) 200 MG tablet TAKE 1/2 TABLET BY MOUTH 2 TIMES DAILY. 30 tablet 1  . clobetasol (OLUX) 0.05 % topical foam Apply 1 application topically 2 (two) times daily.    Marland Kitchen esomeprazole (NEXIUM) 40 MG capsule Take 40 mg by mouth daily at 12 noon.    . etanercept  (ENBREL) 50 MG/ML injection Inject 50 mg into the skin once a week.      . fluticasone (FLONASE) 50 MCG/ACT nasal spray Place 2 sprays into the nose daily.     Marland Kitchen gabapentin (NEURONTIN) 600 MG tablet Take 1 tablet (600 mg total) by mouth 4 (four) times daily. 120 tablet 5  . glipiZIDE (GLUCOTROL) 5 MG tablet Take 5 mg by mouth 2 (two) times daily before a meal.     . HYDROcodone-acetaminophen (VICODIN) 5-500 MG per tablet Take 1 tablet by mouth every 6 (six) hours as needed.      . hydrOXYzine (ATARAX/VISTARIL) 25 MG tablet Take 25 mg by mouth every 8 (eight) hours as needed.     Marland Kitchen ketoconazole (NIZORAL) 2 % cream Apply 1 application topically daily as needed.    . lidocaine (XYLOCAINE) 5 % ointment APPLY TO AFFECTED AREA THREE TIMES DAILY AS NEEDED 30 g 1  . LYRICA 50 MG capsule TAKE 1 CAPSULE BY MOUTH THREE TIMES DAILY (DR'S NOTE PLEASE HAVE PT CALL FOR APPOINTMENT ) 90 capsule 0  . metFORMIN (GLUCOPHAGE) 1000 MG tablet Take 1,000 mg by mouth 2 (two) times daily.      . metoprolol tartrate (LOPRESSOR) 25 MG tablet Take 25 mg by mouth as needed.     . Sildenafil Citrate (VIAGRA PO) Take by mouth daily.      . Tamsulosin HCl (FLOMAX) 0.4 MG CAPS Take 0.4 mg by mouth daily after supper.      No current facility-administered medications for this visit.      Past Surgical History:  Procedure Laterality Date  . CATARACT EXTRACTION Right   . CORONARY ARTERY BYPASS GRAFT       Allergies  Allergen Reactions  . Clopidogrel Hives  . Levofloxacin Hives  . Cymbalta [Duloxetine Hcl]     Headache, shortness of breath  . Paroxetine Hives  . Terbinafine Hcl Hives      Family History  Problem Relation Age of Onset  . Dementia Mother   . Diabetes Mother   . Coronary artery disease Father   . Heart attack Father   . Cancer Brother   . Coronary artery disease Unknown        family hx     Social History Eddie Reed reports that he has been smoking Cigarettes.  He started smoking about 55  years ago. He has a 56.25 pack-year smoking history. He has never used smokeless tobacco. Eddie Reed reports that he drinks alcohol.   Review of Systems CONSTITUTIONAL: No weight loss, fever, chills, weakness or fatigue.  HEENT: Eyes: No visual loss, blurred vision, double vision or yellow sclerae.No hearing loss, sneezing, congestion, runny nose or sore throat.  SKIN:  No rash or itching.  CARDIOVASCULAR: per hpi RESPIRATORY: No shortness of breath, cough or sputum.  GASTROINTESTINAL: No anorexia, nausea, vomiting or diarrhea. No abdominal pain or blood.  GENITOURINARY: No burning on urination, no polyuria NEUROLOGICAL: No headache, dizziness, syncope, paralysis, ataxia, numbness or tingling in the extremities. No change in bowel or bladder control.  MUSCULOSKELETAL: No muscle, back pain, joint pain or stiffness.  LYMPHATICS: No enlarged nodes. No history of splenectomy.  PSYCHIATRIC: No history of depression or anxiety.  ENDOCRINOLOGIC: No reports of sweating, cold or heat intolerance. No polyuria or polydipsia.  Marland Kitchen   Physical Examination Vitals:   02/18/17 1524  BP: (!) 144/90  Pulse: 76  SpO2: 96%   Vitals:   02/18/17 1524  Weight: 208 lb (94.3 kg)  Height: 5\' 10"  (1.778 m)    Gen: resting comfortably, no acute distress HEENT: no scleral icterus, pupils equal round and reactive, no palptable cervical adenopathy,  CV: RRR, no m/r/g, no jvd Resp: + bilateral wheezing GI: abdomen is soft, non-tender, non-distended, normal bowel sounds, no hepatosplenomegaly MSK: extremities are warm, no edema.  Skin: warm, no rash Neuro:  no focal deficits Psych: appropriate affect   Diagnostic Studies  09/2014 Carotid US Bilateral 1-39% carotid stenosis  10/2007 echo  SUMMARY - Overall left ventricular systolic function was normal. Left    ventricular ejection fraction was estimated to be 55 %.    Findings were suggestive of hypokinesis of the mid-distal    septal  wall. Left ventricular wall thickness was mildly    increased.   10/2007 CathHEMODYNAMIC DATA: Central aortic pressure was 106/73, mean 88.  ANGIOGRAPHIC DATA: 1. The left main was free of critical disease. 2. The LAD coursed to the apex. The LAD has been stented in the acute  setting prior to a surgery. There is flow down the LAD and what  appears to be competitive flow after a septal perforator. The  stent has mild luminal irregularities, but is intact. It is a bare-  metal stent. There is competitive filling in the distal vessel.  Importantly, injection of the internal mammary reveals an internal  mammary that is patent. There is perhaps a 30-40% area of bending  in the vessel, and the distal LAD does not fill through the  internal mammary nor does you see it well from the graft, although  there is clear evidence of the competitive filling of the distal  LAD territory. The major diagonal [place] has 80% narrowing and  then 90% narrowing then bifurcates. One bifurcation has  substantial disease and the graft appears to enter right just  beyond this or in the other Eddie Reed where there is evidence of clear-  cut competitive filling. 3. The saphenous vein graft to the diagonal Eddie Reed is widely patent.  There is slow runoff of the graft, but that is due to size  mismatch. 4. There is a small ramus intermedius that appears to be subtotally  occluded. There is what appears to be competitive filling distally  and this is likely due to collateralization. Likewise, the  circumflex is totally occluded and the OM graft is totally  occluded. 5. The right coronary artery has multiple lesions of 50% after a  stent. There is a 30% lesion and some diffuse luminal  irregularities of 30-40% distally. There is then an 80% stenosis  after a small PDA and then evidence of competitive  filling  distally. There is a vein graft to the PDA that appears to be  angiographically  intact.  CONCLUSION: 1. Continued patency of the left internal mammary to the LAD, but with  evidence of what appears to be some competitive filling. 2. Patent saphenous vein graft to a diagonal that enters at a  bifurcation point. 3. Occluded saphenous vein graft to the distal marginal with at least  collateralization of what is likely the ramus and/or circumflex  itself. 4. Continued patency of the saphenous vein graft to the distal  posterolateral segment.  Cath DISPOSITION: At the present time, cardiac rehab and medical therapy will be recommended.   02/2015 [facility] Carotid US IMPRESSION: Bilateral ASVD. No significant stenosis.    Stenosis is validated by velocity measurements with corresponding angiographic measurements and velocity criteria extrapolated in a process based on that defined by the Society of Radiologists in Ultrasound.   02/2015 [facility] Echo Interpretation Summary  A complete portable two-dimensional transthoracic echocardiogram was  performed. The study was technically difficult. The left ventricle is  mildly dilated.  There is mild concentric left ventricular hypertrophy.  The aortic valve is trileaflet.  There is anterior wall hypokinesis.  Unable to adequately determine diastolic dysfunction.  The left atrium is mildly dilated.  The left ventricular ejection fraction is normal (50-55%).    Left Ventricle  The left ventricle is mildly dilated. There is mild concentric left  ventricular hypertrophy. There is anterior wall hypokinesis. The left  ventricular ejection fraction is normal (50-55%). Unable to adequately  determine diastolic dysfunction.      Right Ventricle  The right ventricle is not well visualized. The right ventricle is grossly  normal in size and function.    Atria  The left  atrium is mildly dilated. The right atrium is normal. The IVC is  normal in size.    Mitral Valve  There is mild mitral annular calcification. There is no mitral  regurgitation.      Tricuspid Valve  The tricuspid valve is grossly normal. There is no tricuspid regurgitation.    Aortic Valve  The aortic valve opens well. The aortic valve is trileaflet. There is no  aortic regurgitation present.    Pulmonic Valve  The pulmonic valve is not well visualized.    Vessels  The aortic root is normal in diameter.    Pericardium  There is no pericardial effusion.    MMode/2D Measurements & Calculations  IVSd: 1.3 cmLVIDd: 5.0 cm  LVIDs: 3.6 cm  LVPWd: 1.2 cm    _____________________________________________________________  LV mass(C)d: 257.0 gramsAo root diam: 3.7 cm    LV mass(C)dI: 120.6 grams/m2Ao root area: 10.6 cm2  LA dimension: 4.1 cm    Doppler Measurements & Calculations  MV E max vel: 90.8 cm/secMV dec time: 0.26 sec  MV A max vel: 74.0 cm/sec  MV E/A: 1.2    Assessment and Plan  1. CAD - no symptoms, he will continue current meds. He has been taking lopressor prn, asked to take 25mg  bid.  - EKG shows SR, no acute ischemic changes  2. Hyperipidemia - reported side effects on statin, we will lower atorva to 40mg  daily.    3. HTN - above goal, follow with regular use of lopressor.   4. SOB - order PFTs -with tobacco history will need AAA screening US in the future    F/u 4 months      Arnoldo Lenis, M.D.

## 2017-02-28 ENCOUNTER — Ambulatory Visit (HOSPITAL_COMMUNITY): Payer: Medicare Other | Attending: Cardiology

## 2017-03-07 ENCOUNTER — Encounter: Payer: Self-pay | Admitting: *Deleted

## 2017-04-02 DIAGNOSIS — G5790 Unspecified mononeuropathy of unspecified lower limb: Secondary | ICD-10-CM | POA: Diagnosis not present

## 2017-04-02 DIAGNOSIS — L4050 Arthropathic psoriasis, unspecified: Secondary | ICD-10-CM | POA: Diagnosis not present

## 2017-04-02 DIAGNOSIS — F419 Anxiety disorder, unspecified: Secondary | ICD-10-CM | POA: Diagnosis not present

## 2017-04-02 DIAGNOSIS — G600 Hereditary motor and sensory neuropathy: Secondary | ICD-10-CM | POA: Diagnosis not present

## 2017-04-02 DIAGNOSIS — M797 Fibromyalgia: Secondary | ICD-10-CM | POA: Diagnosis not present

## 2017-04-02 DIAGNOSIS — G629 Polyneuropathy, unspecified: Secondary | ICD-10-CM | POA: Diagnosis not present

## 2017-04-02 DIAGNOSIS — J019 Acute sinusitis, unspecified: Secondary | ICD-10-CM | POA: Diagnosis not present

## 2017-04-02 DIAGNOSIS — Z6830 Body mass index (BMI) 30.0-30.9, adult: Secondary | ICD-10-CM | POA: Diagnosis not present

## 2017-04-02 DIAGNOSIS — E782 Mixed hyperlipidemia: Secondary | ICD-10-CM | POA: Diagnosis not present

## 2017-04-02 DIAGNOSIS — M545 Low back pain: Secondary | ICD-10-CM | POA: Diagnosis not present

## 2017-04-02 DIAGNOSIS — E1165 Type 2 diabetes mellitus with hyperglycemia: Secondary | ICD-10-CM | POA: Diagnosis not present

## 2017-04-02 DIAGNOSIS — I10 Essential (primary) hypertension: Secondary | ICD-10-CM | POA: Diagnosis not present

## 2017-04-02 DIAGNOSIS — E1140 Type 2 diabetes mellitus with diabetic neuropathy, unspecified: Secondary | ICD-10-CM | POA: Diagnosis not present

## 2017-04-02 DIAGNOSIS — Z0001 Encounter for general adult medical examination with abnormal findings: Secondary | ICD-10-CM | POA: Diagnosis not present

## 2017-04-02 DIAGNOSIS — E1151 Type 2 diabetes mellitus with diabetic peripheral angiopathy without gangrene: Secondary | ICD-10-CM | POA: Diagnosis not present

## 2017-04-02 DIAGNOSIS — N401 Enlarged prostate with lower urinary tract symptoms: Secondary | ICD-10-CM | POA: Diagnosis not present

## 2017-05-02 DIAGNOSIS — Z79899 Other long term (current) drug therapy: Secondary | ICD-10-CM | POA: Diagnosis not present

## 2017-05-02 DIAGNOSIS — M170 Bilateral primary osteoarthritis of knee: Secondary | ICD-10-CM | POA: Diagnosis not present

## 2017-05-02 DIAGNOSIS — E1140 Type 2 diabetes mellitus with diabetic neuropathy, unspecified: Secondary | ICD-10-CM | POA: Diagnosis not present

## 2017-05-02 DIAGNOSIS — L4050 Arthropathic psoriasis, unspecified: Secondary | ICD-10-CM | POA: Diagnosis not present

## 2017-05-02 DIAGNOSIS — Z7984 Long term (current) use of oral hypoglycemic drugs: Secondary | ICD-10-CM | POA: Diagnosis not present

## 2017-05-08 DIAGNOSIS — Z6829 Body mass index (BMI) 29.0-29.9, adult: Secondary | ICD-10-CM | POA: Diagnosis not present

## 2017-05-08 DIAGNOSIS — J441 Chronic obstructive pulmonary disease with (acute) exacerbation: Secondary | ICD-10-CM | POA: Diagnosis not present

## 2017-06-25 DIAGNOSIS — G5790 Unspecified mononeuropathy of unspecified lower limb: Secondary | ICD-10-CM | POA: Diagnosis not present

## 2017-06-25 DIAGNOSIS — E1151 Type 2 diabetes mellitus with diabetic peripheral angiopathy without gangrene: Secondary | ICD-10-CM | POA: Diagnosis not present

## 2017-06-25 DIAGNOSIS — G600 Hereditary motor and sensory neuropathy: Secondary | ICD-10-CM | POA: Diagnosis not present

## 2017-06-25 DIAGNOSIS — E1140 Type 2 diabetes mellitus with diabetic neuropathy, unspecified: Secondary | ICD-10-CM | POA: Diagnosis not present

## 2017-06-30 DIAGNOSIS — I10 Essential (primary) hypertension: Secondary | ICD-10-CM | POA: Diagnosis not present

## 2017-06-30 DIAGNOSIS — J441 Chronic obstructive pulmonary disease with (acute) exacerbation: Secondary | ICD-10-CM | POA: Diagnosis not present

## 2017-06-30 DIAGNOSIS — E1165 Type 2 diabetes mellitus with hyperglycemia: Secondary | ICD-10-CM | POA: Diagnosis not present

## 2017-06-30 DIAGNOSIS — F419 Anxiety disorder, unspecified: Secondary | ICD-10-CM | POA: Diagnosis not present

## 2017-06-30 DIAGNOSIS — E559 Vitamin D deficiency, unspecified: Secondary | ICD-10-CM | POA: Diagnosis not present

## 2017-06-30 DIAGNOSIS — M797 Fibromyalgia: Secondary | ICD-10-CM | POA: Diagnosis not present

## 2017-06-30 DIAGNOSIS — E782 Mixed hyperlipidemia: Secondary | ICD-10-CM | POA: Diagnosis not present

## 2017-07-04 DIAGNOSIS — I10 Essential (primary) hypertension: Secondary | ICD-10-CM | POA: Diagnosis not present

## 2017-07-04 DIAGNOSIS — Z6830 Body mass index (BMI) 30.0-30.9, adult: Secondary | ICD-10-CM | POA: Diagnosis not present

## 2017-07-04 DIAGNOSIS — N401 Enlarged prostate with lower urinary tract symptoms: Secondary | ICD-10-CM | POA: Diagnosis not present

## 2017-07-04 DIAGNOSIS — E1165 Type 2 diabetes mellitus with hyperglycemia: Secondary | ICD-10-CM | POA: Diagnosis not present

## 2017-07-04 DIAGNOSIS — Z23 Encounter for immunization: Secondary | ICD-10-CM | POA: Diagnosis not present

## 2017-07-04 DIAGNOSIS — L4050 Arthropathic psoriasis, unspecified: Secondary | ICD-10-CM | POA: Diagnosis not present

## 2017-07-04 DIAGNOSIS — E782 Mixed hyperlipidemia: Secondary | ICD-10-CM | POA: Diagnosis not present

## 2017-07-04 DIAGNOSIS — G629 Polyneuropathy, unspecified: Secondary | ICD-10-CM | POA: Diagnosis not present

## 2017-07-31 DIAGNOSIS — L4050 Arthropathic psoriasis, unspecified: Secondary | ICD-10-CM | POA: Diagnosis not present

## 2017-07-31 DIAGNOSIS — M25461 Effusion, right knee: Secondary | ICD-10-CM | POA: Diagnosis not present

## 2017-07-31 DIAGNOSIS — M170 Bilateral primary osteoarthritis of knee: Secondary | ICD-10-CM | POA: Diagnosis not present

## 2017-07-31 DIAGNOSIS — M25462 Effusion, left knee: Secondary | ICD-10-CM | POA: Diagnosis not present

## 2017-07-31 DIAGNOSIS — F17210 Nicotine dependence, cigarettes, uncomplicated: Secondary | ICD-10-CM | POA: Diagnosis not present

## 2017-07-31 DIAGNOSIS — E1140 Type 2 diabetes mellitus with diabetic neuropathy, unspecified: Secondary | ICD-10-CM | POA: Diagnosis not present

## 2017-07-31 DIAGNOSIS — Z79899 Other long term (current) drug therapy: Secondary | ICD-10-CM | POA: Diagnosis not present

## 2017-08-21 ENCOUNTER — Other Ambulatory Visit: Payer: Self-pay | Admitting: Cardiology

## 2017-09-10 DIAGNOSIS — G600 Hereditary motor and sensory neuropathy: Secondary | ICD-10-CM | POA: Diagnosis not present

## 2017-09-10 DIAGNOSIS — G5790 Unspecified mononeuropathy of unspecified lower limb: Secondary | ICD-10-CM | POA: Diagnosis not present

## 2017-09-10 DIAGNOSIS — E1151 Type 2 diabetes mellitus with diabetic peripheral angiopathy without gangrene: Secondary | ICD-10-CM | POA: Diagnosis not present

## 2017-09-10 DIAGNOSIS — E1140 Type 2 diabetes mellitus with diabetic neuropathy, unspecified: Secondary | ICD-10-CM | POA: Diagnosis not present

## 2017-09-24 DIAGNOSIS — E1151 Type 2 diabetes mellitus with diabetic peripheral angiopathy without gangrene: Secondary | ICD-10-CM | POA: Diagnosis not present

## 2017-09-24 DIAGNOSIS — S90451A Superficial foreign body, right great toe, initial encounter: Secondary | ICD-10-CM | POA: Diagnosis not present

## 2017-09-24 DIAGNOSIS — E1140 Type 2 diabetes mellitus with diabetic neuropathy, unspecified: Secondary | ICD-10-CM | POA: Diagnosis not present

## 2017-09-24 DIAGNOSIS — G600 Hereditary motor and sensory neuropathy: Secondary | ICD-10-CM | POA: Diagnosis not present

## 2017-10-06 DIAGNOSIS — I10 Essential (primary) hypertension: Secondary | ICD-10-CM | POA: Diagnosis not present

## 2017-10-06 DIAGNOSIS — M797 Fibromyalgia: Secondary | ICD-10-CM | POA: Diagnosis not present

## 2017-10-06 DIAGNOSIS — J441 Chronic obstructive pulmonary disease with (acute) exacerbation: Secondary | ICD-10-CM | POA: Diagnosis not present

## 2017-10-06 DIAGNOSIS — F419 Anxiety disorder, unspecified: Secondary | ICD-10-CM | POA: Diagnosis not present

## 2017-10-06 DIAGNOSIS — E1165 Type 2 diabetes mellitus with hyperglycemia: Secondary | ICD-10-CM | POA: Diagnosis not present

## 2017-10-06 DIAGNOSIS — G629 Polyneuropathy, unspecified: Secondary | ICD-10-CM | POA: Diagnosis not present

## 2017-10-06 DIAGNOSIS — E782 Mixed hyperlipidemia: Secondary | ICD-10-CM | POA: Diagnosis not present

## 2017-10-10 DIAGNOSIS — E1165 Type 2 diabetes mellitus with hyperglycemia: Secondary | ICD-10-CM | POA: Diagnosis not present

## 2017-10-10 DIAGNOSIS — Z1389 Encounter for screening for other disorder: Secondary | ICD-10-CM | POA: Diagnosis not present

## 2017-10-10 DIAGNOSIS — N401 Enlarged prostate with lower urinary tract symptoms: Secondary | ICD-10-CM | POA: Diagnosis not present

## 2017-10-10 DIAGNOSIS — I10 Essential (primary) hypertension: Secondary | ICD-10-CM | POA: Diagnosis not present

## 2017-10-10 DIAGNOSIS — Z1331 Encounter for screening for depression: Secondary | ICD-10-CM | POA: Diagnosis not present

## 2017-10-10 DIAGNOSIS — Z6828 Body mass index (BMI) 28.0-28.9, adult: Secondary | ICD-10-CM | POA: Diagnosis not present

## 2017-10-10 DIAGNOSIS — E782 Mixed hyperlipidemia: Secondary | ICD-10-CM | POA: Diagnosis not present

## 2017-10-10 DIAGNOSIS — G629 Polyneuropathy, unspecified: Secondary | ICD-10-CM | POA: Diagnosis not present

## 2017-10-15 DIAGNOSIS — M79671 Pain in right foot: Secondary | ICD-10-CM | POA: Diagnosis not present

## 2017-10-15 DIAGNOSIS — L02611 Cutaneous abscess of right foot: Secondary | ICD-10-CM | POA: Diagnosis not present

## 2017-11-14 DIAGNOSIS — E1140 Type 2 diabetes mellitus with diabetic neuropathy, unspecified: Secondary | ICD-10-CM | POA: Diagnosis not present

## 2017-11-14 DIAGNOSIS — G600 Hereditary motor and sensory neuropathy: Secondary | ICD-10-CM | POA: Diagnosis not present

## 2017-11-14 DIAGNOSIS — G5790 Unspecified mononeuropathy of unspecified lower limb: Secondary | ICD-10-CM | POA: Diagnosis not present

## 2017-11-14 DIAGNOSIS — E1151 Type 2 diabetes mellitus with diabetic peripheral angiopathy without gangrene: Secondary | ICD-10-CM | POA: Diagnosis not present

## 2017-11-16 ENCOUNTER — Other Ambulatory Visit: Payer: Self-pay | Admitting: Cardiology

## 2017-12-14 ENCOUNTER — Other Ambulatory Visit: Payer: Self-pay | Admitting: Cardiology

## 2017-12-29 ENCOUNTER — Other Ambulatory Visit: Payer: Self-pay | Admitting: Cardiology

## 2018-02-19 ENCOUNTER — Other Ambulatory Visit: Payer: Self-pay | Admitting: *Deleted

## 2018-02-19 MED ORDER — ATORVASTATIN CALCIUM 40 MG PO TABS: 40.0000 mg | ORAL_TABLET | Freq: Every day | ORAL | 0 refills | Status: DC

## 2018-03-19 ENCOUNTER — Other Ambulatory Visit: Payer: Self-pay | Admitting: Cardiology

## 2018-03-26 ENCOUNTER — Other Ambulatory Visit: Payer: Self-pay | Admitting: Cardiology

## 2018-04-01 DIAGNOSIS — D485 Neoplasm of uncertain behavior of skin: Secondary | ICD-10-CM | POA: Diagnosis not present

## 2018-04-01 DIAGNOSIS — L97529 Non-pressure chronic ulcer of other part of left foot with unspecified severity: Secondary | ICD-10-CM | POA: Diagnosis not present

## 2018-04-27 DIAGNOSIS — L570 Actinic keratosis: Secondary | ICD-10-CM | POA: Diagnosis not present

## 2018-05-05 ENCOUNTER — Telehealth: Payer: Self-pay | Admitting: Cardiology

## 2018-05-05 NOTE — Telephone Encounter (Signed)
Numerous attempts to contact patient with recall letters. Unable to reach by telephone. with no success.   

## 2018-06-03 ENCOUNTER — Encounter: Payer: Self-pay | Admitting: *Deleted

## 2018-06-04 ENCOUNTER — Ambulatory Visit (INDEPENDENT_AMBULATORY_CARE_PROVIDER_SITE_OTHER): Payer: Medicare Other | Admitting: Cardiology

## 2018-06-04 ENCOUNTER — Encounter: Payer: Self-pay | Admitting: Cardiology

## 2018-06-04 VITALS — BP 100/67 | HR 85 | Ht 70.0 in | Wt 203.4 lb

## 2018-06-04 DIAGNOSIS — E782 Mixed hyperlipidemia: Secondary | ICD-10-CM

## 2018-06-04 DIAGNOSIS — I2510 Atherosclerotic heart disease of native coronary artery without angina pectoris: Secondary | ICD-10-CM

## 2018-06-04 DIAGNOSIS — Z136 Encounter for screening for cardiovascular disorders: Secondary | ICD-10-CM

## 2018-06-04 DIAGNOSIS — I10 Essential (primary) hypertension: Secondary | ICD-10-CM | POA: Diagnosis not present

## 2018-06-04 NOTE — Progress Notes (Signed)
Clinical Summary Mr. Marxen is a 68 y.o.male seen today for follow up of the following medical problems.   1. CAD - history of prior CABG. Last cath 2009 as described below, patent grafts other than occluded SVG-marginal however there were collaterals and medically managed - reports allergy to plavix. Reports ASA upsets stomach. Recently back on ASA and having some burning feeling at times in epigastrium. Has tolerated combined with PPI.     - some heartburn at times, better with antacid. No significant cardiac symptoms.    2. Hyperlipidemia - compliant with statin.   3. DM2 - followed by pcp   4. History of CVA - presented with acute left sided weakness to [place]. Did not receive tPA, transferred to [facility] - MRI of the brain without contrast on showed Multiple small acute/recent embolic type infarcts within the MCA territory of the right frontal and parietal lobes with no associated hemorrhage or mass effect - Echocardiogram with Doppler complete with bubble study was obtained on 02/18/15, showed no cardiac etiology for the stroke.Left ventricular ejection fraction was 55 %. - reports weakness has resolved.  - has f/u pending with neuro  5. SOB - long smoking history, never had PFTs.  - no recent cardiac chest pain, no signicant edema.     Past Medical History:  Diagnosis Date  . Allergy history, drug    plavix  . Arm pain    continued discomfort in his arm- seeing Dr. Annie Main  . CAD (coronary artery disease)    catheterizzation. 11/10/07. patent LIMA to the LAD with competitive filling, patent SVG to diagonal that enters a bifurcation point, occluded SVG to the distal marginal w/some collateralization, patent SVG to distal posterior lateral. medical therapy recommended.  EF 55% echo. June,2009. hypokinesis of the mid and distal septal wall. technically difficult study.  . Carotid artery disease ([place])    Doppler, July, 2012, 0-39% bilateral,  followup 2 years  . Cellulitis    s/p right lower extremity cellulitis  . Claudication [facility])    Arterial Dopplers, July, 2012, normal with normal ABI  . Diabetes mellitus    type not specified  . Drug intolerance    Patient is intolerant to both aspirin and Plavix.  Marland Kitchen Dyslipidemia   . ED (erectile dysfunction)   . Ejection fraction    EF 55%, echo, June, 2009, hypokinesis mid/distal septal wall, technically difficult study  . Fibromyalgia   . Glaucoma    right eye  . H/O alcohol abuse    Alcohol abuse in the past  . Hypertension   . Polyneuropathy in diabetes(357.2) 10/06/2013  . Psoriatic arthritis ([place])   . Ringing in ear    June, 2012 /  carotid Dopplers, in July, 2012, 0-39% bilateral  . S/P CABG x 18 Sep 2007   2009  . Stroke due to embolism of cerebral artery ([place]) 03/08/2015   Right MCA  . Tobacco abuse      Allergies  Allergen Reactions  . Clopidogrel Hives  . Levofloxacin Hives  . Cymbalta [Duloxetine Hcl]     Headache, shortness of breath  . Paroxetine Hives  . Terbinafine Hcl Hives     Current Outpatient Medications  Medication Sig Dispense Refill  . ALPRAZolam (XANAX) 1 MG tablet Take 1 mg by mouth 3 (three) times daily as needed.      Marland Kitchen amLODipine (NORVASC) 10 MG tablet Take 10 mg by mouth daily.      Marland Kitchen aspirin 81 MG  tablet Take 81 mg by mouth daily.    Marland Kitchen atorvastatin (LIPITOR) 40 MG tablet TAKE 1 TABLET BY MOUTH DAILY PT NEEDS APPOINTMENT FOR FURTHER REFILLS 3 tablet 0  . benazepril (LOTENSIN) 20 MG tablet Take 20 mg by mouth daily.     . betamethasone dipropionate (DIPROLENE) 0.05 % cream Apply 1 application topically daily.     Marland Kitchen buPROPion (WELLBUTRIN) 100 MG tablet TAKE 1 TABLET BY MOUTH TWICE DAILY 60 tablet 0  . carbamazepine (TEGRETOL) 200 MG tablet TAKE 1/2 TABLET BY MOUTH 2 TIMES DAILY. 30 tablet 1  . clobetasol (OLUX) 0.05 % topical foam Apply 1 application topically 2 (two) times daily.    Marland Kitchen esomeprazole (NEXIUM) 40 MG capsule Take 40 mg by  mouth daily at 12 noon.    . etanercept (ENBREL) 50 MG/ML injection Inject 50 mg into the skin once a week.      . gabapentin (NEURONTIN) 600 MG tablet Take 1 tablet (600 mg total) by mouth 4 (four) times daily. 120 tablet 5  . glipiZIDE (GLUCOTROL) 5 MG tablet Take 5 mg by mouth 2 (two) times daily before a meal.     . HYDROcodone-acetaminophen (VICODIN) 5-500 MG per tablet Take 1 tablet by mouth every 6 (six) hours as needed.      . hydrOXYzine (ATARAX/VISTARIL) 25 MG tablet Take 25 mg by mouth every 8 (eight) hours as needed.     . lidocaine (XYLOCAINE) 5 % ointment APPLY TO AFFECTED AREA THREE TIMES DAILY AS NEEDED 30 g 1  . LYRICA 50 MG capsule TAKE 1 CAPSULE BY MOUTH THREE TIMES DAILY (DR'S NOTE PLEASE HAVE PT CALL FOR APPOINTMENT ) 90 capsule 0  . metFORMIN (GLUCOPHAGE) 1000 MG tablet Take 1,000 mg by mouth 2 (two) times daily.      . metoprolol tartrate (LOPRESSOR) 25 MG tablet Take 1 tablet (25 mg total) by mouth 2 (two) times daily. 180 tablet 1  . Sildenafil Citrate (VIAGRA PO) Take by mouth daily.      . Tamsulosin HCl (FLOMAX) 0.4 MG CAPS Take 0.4 mg by mouth daily after supper.      No current facility-administered medications for this visit.      Past Surgical History:  Procedure Laterality Date  . CATARACT EXTRACTION Right   . CORONARY ARTERY BYPASS GRAFT       Allergies  Allergen Reactions  . Clopidogrel Hives  . Levofloxacin Hives  . Cymbalta [Duloxetine Hcl]     Headache, shortness of breath  . Paroxetine Hives  . Terbinafine Hcl Hives      Family History  Problem Relation Age of Onset  . Dementia Mother   . Diabetes Mother   . Coronary artery disease Father   . Heart attack Father   . Cancer Brother   . Coronary artery disease Other        family hx     Social History Mr. Ingrum reports that he has been smoking cigarettes. He started smoking about 57 years ago. He has a 22.50 pack-year smoking history. He has never used smokeless tobacco. Mr.  Pfeifer reports current alcohol use.   Review of Systems CONSTITUTIONAL: No weight loss, fever, chills, weakness or fatigue.  HEENT: Eyes: No visual loss, blurred vision, double vision or yellow sclerae.No hearing loss, sneezing, congestion, runny nose or sore throat.  SKIN: No rash or itching.  CARDIOVASCULAR: per hpi RESPIRATORY: per hpi  GASTROINTESTINAL:+heartburn GENITOURINARY: No burning on urination, no polyuria NEUROLOGICAL: No headache, dizziness, syncope, paralysis, ataxia,  numbness or tingling in the extremities. No change in bowel or bladder control.  MUSCULOSKELETAL: No muscle, back pain, joint pain or stiffness.  LYMPHATICS: No enlarged nodes. No history of splenectomy.  PSYCHIATRIC: No history of depression or anxiety.  ENDOCRINOLOGIC: No reports of sweating, cold or heat intolerance. No polyuria or polydipsia.  Marland Kitchen   Physical Examination Vitals:   06/04/18 1421  BP: 100/67  Pulse: 85  SpO2: 95%   Vitals:   06/04/18 1421  Weight: 203 lb 6.4 oz (92.3 kg)  Height: 5\' 10"  (1.778 m)    Gen: resting comfortably, no acute distress HEENT: no scleral icterus, pupils equal round and reactive, no palptable cervical adenopathy,  CV: RRR, no m/r/g, on jvd Resp: Clear to auscultation bilaterally GI: abdomen is soft, non-tender, non-distended, normal bowel sounds, no hepatosplenomegaly MSK: extremities are warm, no edema.  Skin: warm, no rash Neuro:  no focal deficits Psych: appropriate affect   Diagnostic Studies 09/2014 Carotid US Bilateral 1-39% carotid stenosis  10/2007 echo  SUMMARY - Overall left ventricular systolic function was normal. Left ventricular ejection fraction was estimated to be 55 %. Findings were suggestive of hypokinesis of the mid-distal septal wall. Left ventricular wall thickness was mildly increased.   10/2007 [place] DATA:Central aortic pressure was 106/73, mean 88.  ANGIOGRAPHIC DATA: 1.  The left main was free of critical disease. 2. The LAD coursed to the apex.The LAD has been stented in the acute setting prior to a surgery.There is flow down the LAD and what appears to be competitive flow after a septal perforator.The stent has mild luminal irregularities, but is intact.It is a bare- metal stent.There is competitive filling in the distal vessel. Importantly, injection of the internal mammary reveals an internal mammary that is patent.There is perhaps a 30-40% area of bending in the vessel, and the distal LAD does not fill through the internal mammary nor does you see it well from the graft, although there is clear evidence of the competitive filling of the distal LAD territory.The major diagonal Eliska Hamil has 80% narrowing and then 90% narrowing then bifurcates.One bifurcation has substantial disease and the graft appears to enter right just beyond this or in the other Casen Pryor where there is evidence of clear- cut competitive filling. 3. The saphenous vein graft to the diagonal Doriann Zuch is widely patent. There is slow runoff of the graft, but that is due to size mismatch. 4. There is a small ramus intermedius that appears to be subtotally occluded.There is what appears to be competitive filling distally and this is likely due to collateralization.Likewise, the circumflex is totally occluded and the OM graft is totally occluded. 5. The right coronary artery has multiple lesions of 50% after a stent.There is a 30% lesion and some diffuse luminal irregularities of 30-40% distally.There is then an 80% stenosis after a small PDA and then evidence of competitive filling distally.There is a vein graft to the PDA that appears to be angiographically intact.  CONCLUSION: 1. Continued patency of the left internal mammary to the  LAD, but with evidence of what appears to be some competitive filling. 2. Patent saphenous vein graft to a diagonal that enters at a bifurcation point. 3. Occluded saphenous vein graft to the distal marginal with at least collateralization of what is likely the ramus and/or circumflex itself. 4. Continued patency of the saphenous vein graft to the distal posterolateral segment.  Cath DISPOSITION:At the present time, cardiac rehab and medical therapy will be recommended.   02/2015 [facility] Carotid US  IMPRESSION: Bilateral ASVD. No significant stenosis.  Stenosis is validated by velocity measurements with corresponding angiographic measurements and velocity criteria extrapolated in a process based on that defined by the Society of Radiologists in Ultrasound.  02/2015 [facility] Echo Interpretation Summary A complete portable two-dimensional transthoracic echocardiogram was performed. The study was technically difficult. The left ventricle is mildly dilated. There is mild concentric left ventricular hypertrophy. The aortic valve is trileaflet. There is anterior wall hypokinesis. Unable to adequately determine diastolic dysfunction. The left atrium is mildly dilated. The left ventricular ejection fraction is normal (50-55%).  Left Ventricle The left ventricle is mildly dilated. There is mild concentric left ventricular hypertrophy. There is anterior wall hypokinesis. The left ventricular ejection fraction is normal (50-55%). Unable to adequately determine diastolic dysfunction.   Right Ventricle The right ventricle is not well visualized. The right ventricle is grossly normal in size and function.  Atria The left atrium is mildly dilated. The right atrium is normal. The IVC is normal in size.  Mitral Valve There is mild mitral annular calcification. There is no  mitral regurgitation.   Tricuspid Valve The tricuspid valve is grossly normal. There is no tricuspid regurgitation.  Aortic Valve The aortic valve opens well. The aortic valve is trileaflet. There is no aortic regurgitation present.  Pulmonic Valve The pulmonic valve is not well visualized.  Vessels The aortic root is normal in diameter.  Pericardium There is no pericardial effusion.  MMode/2D Measurements & Calculations IVSd: 1.3 cmLVIDd: 5.0 cm LVIDs: 3.6 cm LVPWd: 1.2 cm  _____________________________________________________________ LV mass(C)d: 257.0 gramsAo root diam: 3.7 cm  LV mass(C)dI: 120.6 grams/m2Ao root area: 10.6 cm2 LA dimension: 4.1 cm  Doppler Measurements & Calculations MV E max vel: 90.8 cm/secMV dec time: 0.26 sec MV A max vel: 74.0 cm/sec MV E/A: 1.2    Assessment and Plan   1. CAD - no significnat symptoms, continue current meds. I suspect recent SOB more related to his smoking history and likely undiagnosed COPD, we will order PFTs - EKG today shows SR, no acute ischemic changes  2. Hyperipidemia - continue statin, side effects on higher doses (reported tinnitus), continue current dosing.  - request pcp labs   3. HTN - at goal, conitnue current meds  4. SOB - order PFTs, long smoking history  5. AAA screen - male over 17 with smoking history, order AAA screening US.   F/u 6 months     Arnoldo Lenis, M.D.

## 2018-06-04 NOTE — Patient Instructions (Addendum)
Your physician wants you to follow-up in: 6 MONTHS WITH DR BRANCH You will receive a reminder letter in the mail two months in advance. If you don't receive a letter, please call our office to schedule the follow-up appointment.  Your physician recommends that you continue on your current medications as directed. Please refer to the Current Medication list given to you today.  Your physician has requested that you have an abdominal aorta duplex. During this test, an ultrasound is used to evaluate the aorta. Allow 30 minutes for this exam. Do not eat after midnight the day before and avoid carbonated beverages  Thank you for choosing [facility]!!    

## 2018-06-11 ENCOUNTER — Encounter: Payer: Self-pay | Admitting: *Deleted

## 2018-06-24 ENCOUNTER — Ambulatory Visit (INDEPENDENT_AMBULATORY_CARE_PROVIDER_SITE_OTHER): Payer: Medicare Other

## 2018-06-24 DIAGNOSIS — Z136 Encounter for screening for cardiovascular disorders: Secondary | ICD-10-CM | POA: Diagnosis not present

## 2018-07-02 ENCOUNTER — Telehealth: Payer: Self-pay | Admitting: *Deleted

## 2018-07-02 NOTE — Telephone Encounter (Signed)
Pt aware - routed to pcp  

## 2018-07-02 NOTE — Telephone Encounter (Signed)
-----   Message from Arnoldo Lenis, MD sent at 06/29/2018  4:28 PM EST ----- No evidence of aneurysm by ultrasound  J BrancH MD

## 2018-07-28 ENCOUNTER — Other Ambulatory Visit: Payer: Self-pay

## 2018-07-28 NOTE — Patient Outreach (Signed)
[name]) Care Management  07/28/2018  Eddie Reed Sr. 06/24/1950 [id number]   Medication Adherence call to Mr. [name] patient is due on all his medications, [name] said last pick up was on 06/24/18 for a 30 days supply. [name] is showing past due under [place].   [place] Management Direct Dial [phone]  Fax [phone] [name]

## 2018-10-30 ENCOUNTER — Encounter: Payer: Self-pay | Admitting: Cardiology

## 2019-02-08 ENCOUNTER — Telehealth: Payer: Self-pay | Admitting: Cardiology

## 2019-02-08 ENCOUNTER — Encounter

## 2019-02-08 ENCOUNTER — Other Ambulatory Visit: Payer: Self-pay

## 2019-02-08 ENCOUNTER — Encounter: Payer: Self-pay | Admitting: *Deleted

## 2019-02-08 ENCOUNTER — Ambulatory Visit (INDEPENDENT_AMBULATORY_CARE_PROVIDER_SITE_OTHER): Payer: Medicare Other | Admitting: Cardiology

## 2019-02-08 ENCOUNTER — Encounter: Payer: Self-pay | Admitting: Cardiology

## 2019-02-08 VITALS — BP 121/82 | HR 88 | Ht 70.0 in | Wt 193.6 lb

## 2019-02-08 DIAGNOSIS — I10 Essential (primary) hypertension: Secondary | ICD-10-CM

## 2019-02-08 DIAGNOSIS — I2510 Atherosclerotic heart disease of native coronary artery without angina pectoris: Secondary | ICD-10-CM

## 2019-02-08 DIAGNOSIS — E782 Mixed hyperlipidemia: Secondary | ICD-10-CM | POA: Diagnosis not present

## 2019-02-08 DIAGNOSIS — R0602 Shortness of breath: Secondary | ICD-10-CM

## 2019-02-08 DIAGNOSIS — Z01810 Encounter for preprocedural cardiovascular examination: Secondary | ICD-10-CM

## 2019-02-08 NOTE — Telephone Encounter (Signed)
Pre-cert Verification for the following procedure    Lexiscan myoview scheduled for 02-10-2019 at [facility].

## 2019-02-08 NOTE — Progress Notes (Signed)
Clinical Summary Mr. Neyer is a 68 y.o.male seen today for follow up of the following medical problems.  1. CAD - history of prior CABG. Last cath 2009 as described below, patent grafts other than occluded SVG-marginal however there were collaterals and medically managed - reports allergy to plavix. Reports ASA upsets stomach. Recently back on ASA and having some burning feeling at times in epigastrium.Has tolerated combined with PPI.   - 2 episodes of sharp chest pain he thinks was indigestion, better with antacid - sedentary lifestyle due to chronic knee pain. Uses walker at home. Not able to do his regular yard work. Walks up a flight of stairs at home few times a day, can have some DOE with activities.    2. Hyperlipidemia - labs followed by pcp - compliant with statin   3. DM2 - followed by pcp   4.History ofCVA - presented with acute left sided weakness to [place]. Did not receive tPA, transferred to [facility] - MRI of the brain without contrast on showed Multiple small acute/recent embolic type infarcts within the MCA territory of the right frontal and parietal lobes with no associated hemorrhage or mass effect - Echocardiogram with Doppler complete with bubble study was obtained on 02/18/15, showed no cardiac etiology for the stroke.Left ventricular ejection fraction was 55 %. - reports weakness has resolved.     5. Preop evaluation - being considered for knee replacement.  - exertion limited  by chronic knee pain.      Past Medical History:  Diagnosis Date  . Allergy history, drug    plavix  . Arm pain    continued discomfort in his arm- seeing Dr. Annie Main  . CAD (coronary artery disease)    catheterizzation. 11/10/07. patent LIMA to the LAD with competitive filling, patent SVG to diagonal that enters a bifurcation point, occluded SVG to the distal marginal w/some collateralization, patent SVG to distal posterior lateral. medical therapy  recommended.  EF 55% echo. June,2009. hypokinesis of the mid and distal septal wall. technically difficult study.  . Carotid artery disease ([place])    Doppler, July, 2012, 0-39% bilateral, followup 2 years  . Cellulitis    s/p right lower extremity cellulitis  . Claudication [facility])    Arterial Dopplers, July, 2012, normal with normal ABI  . Diabetes mellitus    type not specified  . Drug intolerance    Patient is intolerant to both aspirin and Plavix.  Marland Kitchen Dyslipidemia   . ED (erectile dysfunction)   . Ejection fraction    EF 55%, echo, June, 2009, hypokinesis mid/distal septal wall, technically difficult study  . Fibromyalgia   . Glaucoma    right eye  . H/O alcohol abuse    Alcohol abuse in the past  . Hypertension   . Polyneuropathy in diabetes(357.2) 10/06/2013  . Psoriatic arthritis ([place])   . Ringing in ear    June, 2012 /  carotid Dopplers, in July, 2012, 0-39% bilateral  . S/P CABG x 18 Sep 2007   2009  . Stroke due to embolism of cerebral artery ([place]) 03/08/2015   Right MCA  . Tobacco abuse      Allergies  Allergen Reactions  . Clopidogrel Hives  . Levofloxacin Hives  . Cymbalta [Duloxetine Hcl]     Headache, shortness of breath  . Paroxetine Hives  . Terbinafine Hcl Hives     Current Outpatient Medications  Medication Sig Dispense Refill  . ALPRAZolam (XANAX) 1 MG tablet Take 1  mg by mouth 3 (three) times daily as needed.      Marland Kitchen amLODipine (NORVASC) 10 MG tablet Take 10 mg by mouth daily.      Marland Kitchen Apoaequorin (PREVAGEN PO) Take 1 tablet by mouth daily.    Marland Kitchen aspirin 81 MG tablet Take 81 mg by mouth daily.    Marland Kitchen atorvastatin (LIPITOR) 40 MG tablet TAKE 1 TABLET BY MOUTH DAILY PT NEEDS APPOINTMENT FOR FURTHER REFILLS 3 tablet 0  . benazepril (LOTENSIN) 20 MG tablet Take 20 mg by mouth daily.     . betamethasone dipropionate (DIPROLENE) 0.05 % cream Apply 1 application topically daily.     Marland Kitchen buPROPion (WELLBUTRIN) 100 MG tablet TAKE 1 TABLET BY MOUTH TWICE DAILY 60  tablet 0  . carbamazepine (TEGRETOL) 200 MG tablet TAKE 1/2 TABLET BY MOUTH 2 TIMES DAILY. 30 tablet 1  . clobetasol (OLUX) 0.05 % topical foam Apply 1 application topically 2 (two) times daily.    Marland Kitchen esomeprazole (NEXIUM) 40 MG capsule Take 40 mg by mouth daily at 12 noon.    . etanercept (ENBREL) 50 MG/ML injection Inject 50 mg into the skin once a week.      . gabapentin (NEURONTIN) 600 MG tablet Take 1 tablet (600 mg total) by mouth 4 (four) times daily. 120 tablet 5  . glipiZIDE (GLUCOTROL) 5 MG tablet Take 5 mg by mouth 2 (two) times daily before a meal.     . HYDROcodone-acetaminophen (VICODIN) 5-500 MG per tablet Take 1 tablet by mouth every 6 (six) hours as needed.      . hydrOXYzine (ATARAX/VISTARIL) 25 MG tablet Take 25 mg by mouth every 8 (eight) hours as needed.     . lidocaine (XYLOCAINE) 5 % ointment APPLY TO AFFECTED AREA THREE TIMES DAILY AS NEEDED 30 g 1  . LYRICA 50 MG capsule TAKE 1 CAPSULE BY MOUTH THREE TIMES DAILY (DR'S NOTE PLEASE HAVE PT CALL FOR APPOINTMENT ) 90 capsule 0  . metFORMIN (GLUCOPHAGE) 1000 MG tablet Take 1,000 mg by mouth 2 (two) times daily.      . metoprolol tartrate (LOPRESSOR) 25 MG tablet Take 1 tablet (25 mg total) by mouth 2 (two) times daily. 180 tablet 1  . Omega-3 Fatty Acids (FISH OIL) 1000 MG CPDR Take 1 capsule by mouth 2 (two) times daily.    . Sildenafil Citrate (VIAGRA PO) Take by mouth as needed.     . Tamsulosin HCl (FLOMAX) 0.4 MG CAPS Take 0.4 mg by mouth daily after supper.      No current facility-administered medications for this visit.      Past Surgical History:  Procedure Laterality Date  . CATARACT EXTRACTION Right   . CORONARY ARTERY BYPASS GRAFT       Allergies  Allergen Reactions  . Clopidogrel Hives  . Levofloxacin Hives  . Cymbalta [Duloxetine Hcl]     Headache, shortness of breath  . Paroxetine Hives  . Terbinafine Hcl Hives      Family History  Problem Relation Age of Onset  . Dementia Mother   .  Diabetes Mother   . Coronary artery disease Father   . Heart attack Father   . Cancer Brother   . Coronary artery disease Other        family hx     Social History Mr. Heuton reports that he has been smoking cigarettes. He started smoking about 57 years ago. He has a 22.50 pack-year smoking history. He has never used smokeless tobacco. Mr. Hoyos  reports current alcohol use.   Review of Systems CONSTITUTIONAL: No weight loss, fever, chills, weakness or fatigue.  HEENT: Eyes: No visual loss, blurred vision, double vision or yellow sclerae.No hearing loss, sneezing, congestion, runny nose or sore throat.  SKIN: No rash or itching.  CARDIOVASCULAR: per hpi RESPIRATORY: No shortness of breath, cough or sputum.  GASTROINTESTINAL: No anorexia, nausea, vomiting or diarrhea. No abdominal pain or blood.  GENITOURINARY: No burning on urination, no polyuria NEUROLOGICAL: No headache, dizziness, syncope, paralysis, ataxia, numbness or tingling in the extremities. No change in bowel or bladder control.  MUSCULOSKELETAL: No muscle, back pain, joint pain or stiffness.  LYMPHATICS: No enlarged nodes. No history of splenectomy.  PSYCHIATRIC: No history of depression or anxiety.  ENDOCRINOLOGIC: No reports of sweating, cold or heat intolerance. No polyuria or polydipsia.  Marland Kitchen   Physical Examination Today's Vitals   02/08/19 0927  BP: 121/82  Pulse: 88  SpO2: 97%  Weight: 193 lb 9.6 oz (87.8 kg)  Height: 5\' 10"  (1.778 m)   Body mass index is 27.78 kg/m.  Gen: resting comfortably, no acute distress HEENT: no scleral icterus, pupils equal round and reactive, no palptable cervical adenopathy,  CV: RRR, no m/r/g, no jvd Resp: Clear to auscultation bilaterally GI: abdomen is soft, non-tender, non-distended, normal bowel sounds, no hepatosplenomegaly MSK: extremities are warm, no edema.  Skin: warm, no rash Neuro:  no focal deficits Psych: appropriate affect   Diagnostic Studies 09/2014  Carotid US Bilateral 1-39% carotid stenosis  10/2007 echo  SUMMARY - Overall left ventricular systolic function was normal. Left ventricular ejection fraction was estimated to be 55 %. Findings were suggestive of hypokinesis of the mid-distal septal wall. Left ventricular wall thickness was mildly increased.   10/2007 [facility] DATA:Central aortic pressure was 106/73, mean 88.  ANGIOGRAPHIC DATA: 1. The left main was free of critical disease. 2. The LAD coursed to the apex.The LAD has been stented in the acute setting prior to a surgery.There is flow down the LAD and what appears to be competitive flow after a septal perforator.The stent has mild luminal irregularities, but is intact.It is a bare- metal stent.There is competitive filling in the distal vessel. Importantly, injection of the internal mammary reveals an internal mammary that is patent.There is perhaps a 30-40% area of bending in the vessel, and the distal LAD does not fill through the internal mammary nor does you see it well from the graft, although there is clear evidence of the competitive filling of the distal LAD territory.The major diagonal Khyli Swaim has 80% narrowing and then 90% narrowing then bifurcates.One bifurcation has substantial disease and the graft appears to enter right just beyond this or in the other Brittanni Cariker where there is evidence of clear- cut competitive filling. 3. The saphenous vein graft to the diagonal Yacine Droz is widely patent. There is slow runoff of the graft, but that is due to size mismatch. 4. There is a small ramus intermedius that appears to be subtotally occluded.There is what appears to be competitive filling distally and this is likely due to collateralization.Likewise, the circumflex is totally occluded and the OM graft is  totally occluded. 5. The right coronary artery has multiple lesions of 50% after a stent.There is a 30% lesion and some diffuse luminal irregularities of 30-40% distally.There is then an 80% stenosis after a small PDA and then evidence of competitive filling distally.There is a vein graft to the PDA that appears to be angiographically intact.  CONCLUSION: 1. Continued patency of the  left internal mammary to the LAD, but with evidence of what appears to be some competitive filling. 2. Patent saphenous vein graft to a diagonal that enters at a bifurcation point. 3. Occluded saphenous vein graft to the distal marginal with at least collateralization of what is likely the ramus and/or circumflex itself. 4. Continued patency of the saphenous vein graft to the distal posterolateral segment.  Cath DISPOSITION:At the present time, cardiac rehab and medical therapy will be recommended.   02/2015 [facility] Carotid US IMPRESSION: Bilateral ASVD. No significant stenosis.  Stenosis is validated by velocity measurements with corresponding angiographic measurements and velocity criteria extrapolated in a process based on that defined by the Society of Radiologists in Ultrasound.  02/2015 [facility] Echo Interpretation Summary A complete portable two-dimensional transthoracic echocardiogram was performed. The study was technically difficult. The left ventricle is mildly dilated. There is mild concentric left ventricular hypertrophy. The aortic valve is trileaflet. There is anterior wall hypokinesis. Unable to adequately determine diastolic dysfunction. The left atrium is mildly dilated. The left ventricular ejection fraction is normal (50-55%).  Left Ventricle The left ventricle is mildly dilated. There is mild concentric left ventricular hypertrophy. There is anterior wall hypokinesis.  The left ventricular ejection fraction is normal (50-55%). Unable to adequately determine diastolic dysfunction.   Right Ventricle The right ventricle is not well visualized. The right ventricle is grossly normal in size and function.  Atria The left atrium is mildly dilated. The right atrium is normal. The IVC is normal in size.  Mitral Valve There is mild mitral annular calcification. There is no mitral regurgitation.   Tricuspid Valve The tricuspid valve is grossly normal. There is no tricuspid regurgitation.  Aortic Valve The aortic valve opens well. The aortic valve is trileaflet. There is no aortic regurgitation present.  Pulmonic Valve The pulmonic valve is not well visualized.  Vessels The aortic root is normal in diameter.  Pericardium There is no pericardial effusion.  MMode/2D Measurements &Calculations IVSd: 1.3 cmLVIDd: 5.0 cm LVIDs: 3.6 cm LVPWd: 1.2 cm  _____________________________________________________________ LV mass(C)d: 257.0 gramsAo root diam: 3.7 cm  LV mass(C)dI: 120.6 grams/m2Ao root area: 10.6 cm2 LA dimension: 4.1 cm  Doppler Measurements &Calculations MV E max vel: 90.8 cm/secMV dec time: 0.26 sec MV A max vel: 74.0 cm/sec MV E/A: 1.2   06/2018 AAA Screen Summary: Abdominal Aorta: No evidence of an abdominal aortic aneurysm was visualized. The largest aortic measurement is 2.8 cm.   Assessment and Plan   1. CAD - no recent chest pain. Some recent DOE suspect more likely lung related, he did not have the PFTs we ordered last visit. Also likely some deconditioning, limited by severe knee pain - due to possible knee surgery  and inabiliaty to gauge exercise capacity by history due to knee pain and also some DOE will plan for lexiscan.   2. Hyperipidemia -requestl labs from pcp, continue statin   3. HTN - he is at goal, continue current meds  4.Preoperative evaluation - unable to assess exercise capacity by history due to chronic knee pain, obtain lexiscan.         Arnoldo Lenis, M.D.

## 2019-02-08 NOTE — Patient Instructions (Signed)
Medication Instructions:  Continue all current medications.  Labwork: none  Testing/Procedures:  Your physician has requested that you have a lexiscan myoview. For further information please visit [email or web address]. Please follow instruction sheet, as given.  Office will contact with results via phone or letter.    Follow-Up: Your physician wants you to follow up in: 6 months.  You will receive a reminder letter in the mail one-two months in advance.  If you don't receive a letter, please call our office to schedule the follow up appointment   Any Other Special Instructions Will Be Listed Below (If Applicable).  If you need a refill on your cardiac medications before your next appointment, please call your pharmacy.  

## 2019-02-08 NOTE — Addendum Note (Signed)
Addended by: Laurine Blazer on: 02/08/2019 10:09 AM   Modules accepted: Orders

## 2019-02-09 ENCOUNTER — Telehealth: Payer: Self-pay | Admitting: Cardiology

## 2019-02-09 NOTE — Telephone Encounter (Signed)
Mr. Akes called the office requesting to speak with Dr. Harl Bowie.in regards to stress test that is scheduled for 02-10-2019 at [facility]. He states that Dr. Harl Bowie did not discuss with him about the strokes he has had in the past. Very concerned about the dye that he will receive.

## 2019-02-09 NOTE — Telephone Encounter (Signed)
Pt is concerned with lexiscan medication for stress test and very anxious - explained how the medication for stress test worked and would not last long and that this was necessary for upcoming knee surgery with previous MI and strokes - pt agreeable to stress test but is still requesting that Dr Harl Bowie call him if he could (did explain that Dr Harl Bowie may not be able to call as he is in clinic today)

## 2019-02-10 ENCOUNTER — Ambulatory Visit (HOSPITAL_COMMUNITY)
Admission: RE | Admit: 2019-02-10 | Discharge: 2019-02-10 | Disposition: A | Discharge status: Home / Self Care | Payer: Medicare Other | Source: Ambulatory Visit | Attending: Cardiology | Admitting: Cardiology

## 2019-02-10 ENCOUNTER — Other Ambulatory Visit: Payer: Self-pay

## 2019-02-10 ENCOUNTER — Encounter (HOSPITAL_COMMUNITY)
Admission: RE | Admit: 2019-02-10 | Discharge: 2019-02-10 | Disposition: A | Discharge status: Home / Self Care | Payer: Medicare Other | Source: Ambulatory Visit | Attending: Cardiology | Admitting: Cardiology

## 2019-02-10 DIAGNOSIS — R0602 Shortness of breath: Secondary | ICD-10-CM | POA: Diagnosis present

## 2019-02-10 LAB — NM MYOCAR MULTI W/SPECT W/WALL MOTION / EF
LV dias vol: 115 mL (ref 62–150)
LV sys vol: 74 mL
Peak HR: 95 {beats}/min
RATE: 0.33
Rest HR: 73 {beats}/min
SDS: 2
SRS: 20
SSS: 22
TID: 1.28

## 2019-02-10 MED ORDER — TECHNETIUM TC 99M TETROFOSMIN IV KIT
30.0000 | PACK | Freq: Once | INTRAVENOUS | Status: AC | PRN
  Administered 2019-02-10: 12:00:00 31 via INTRAVENOUS

## 2019-02-10 MED ORDER — TECHNETIUM TC 99M TETROFOSMIN IV KIT
10.0000 | PACK | Freq: Once | INTRAVENOUS | Status: AC | PRN
  Administered 2019-02-10: 10.1 via INTRAVENOUS

## 2019-02-10 MED ORDER — REGADENOSON 0.4 MG/5ML IV SOLN
INTRAVENOUS | Status: AC
  Administered 2019-02-10: 12:00:00 0.4 mg via INTRAVENOUS
  Filled 2019-02-10: qty 5

## 2019-02-10 MED ORDER — SODIUM CHLORIDE 0.9% FLUSH
INTRAVENOUS | Status: AC
  Administered 2019-02-10: 10 mL via INTRAVENOUS
  Filled 2019-02-10: qty 10

## 2019-02-12 ENCOUNTER — Telehealth: Payer: Self-pay | Admitting: *Deleted

## 2019-02-12 NOTE — Telephone Encounter (Signed)
-----   Message from Arnoldo Lenis, MD sent at 02/11/2019 12:48 PM EDT ----- Stress test looks fine, evidence of old damage but no current blockages. Ok to proceed with surgery   Zandra Abts MD

## 2019-02-12 NOTE — Telephone Encounter (Signed)
Pt voiced understanding - routed to pcp  

## 2019-08-17 ENCOUNTER — Ambulatory Visit (INDEPENDENT_AMBULATORY_CARE_PROVIDER_SITE_OTHER): Payer: Medicare Other | Admitting: Cardiology

## 2019-08-17 ENCOUNTER — Other Ambulatory Visit: Payer: Self-pay

## 2019-08-17 ENCOUNTER — Encounter: Payer: Self-pay | Admitting: *Deleted

## 2019-08-17 ENCOUNTER — Encounter: Payer: Self-pay | Admitting: Cardiology

## 2019-08-17 VITALS — BP 110/60 | HR 76 | Ht 70.0 in | Wt 199.6 lb

## 2019-08-17 DIAGNOSIS — I10 Essential (primary) hypertension: Secondary | ICD-10-CM

## 2019-08-17 DIAGNOSIS — I2510 Atherosclerotic heart disease of native coronary artery without angina pectoris: Secondary | ICD-10-CM

## 2019-08-17 DIAGNOSIS — E782 Mixed hyperlipidemia: Secondary | ICD-10-CM

## 2019-08-17 NOTE — Patient Instructions (Signed)

## 2019-08-17 NOTE — Progress Notes (Signed)
Clinical Summary Eddie Reed is a 69 y.o.male seen today for follow up of the following medical problems.  1. CAD - history of prior CABG. Last cath 2009 as described below, patent grafts other than occluded SVG-marginal however there were collaterals and medically managed - reports allergy to plavix. Reports ASA upsets stomach. Recently back on ASA and having some burning feeling at times in epigastrium.Has tolerated combined with PPI.     01/2019 nuclear stress: prior scar, no current ischemia  - limited physically since knee surgery - no recent chest pain. Some mild SOB - compliant with meds     2. Hyperlipidemia -labs followed by pcp - he is compliant with statin   3. DM2 - followed by pcp   4.History ofCVA - presented with acute left sided weakness to [place]. Did not receive tPA, transferred to [facility] - MRI of the brain without contrast on showed Multiple small acute/recent embolic type infarcts within the MCA territory of the right frontal and parietal lobes with no associated hemorrhage or mass effect - Echocardiogram with Doppler complete with bubble study was obtained on 02/18/15, showed no cardiac etiology for the stroke.Left ventricular ejection fraction was 55 %. - reports weakness has resolved.      Past Medical History:  Diagnosis Date  . Allergy history, drug    plavix  . Arm pain    continued discomfort in his arm- seeing Dr. Annie Main  . CAD (coronary artery disease)    catheterizzation. 11/10/07. patent LIMA to the LAD with competitive filling, patent SVG to diagonal that enters a bifurcation point, occluded SVG to the distal marginal w/some collateralization, patent SVG to distal posterior lateral. medical therapy recommended.  EF 55% echo. June,2009. hypokinesis of the mid and distal septal wall. technically difficult study.  . Carotid artery disease ([place])    Doppler, July, 2012, 0-39% bilateral, followup 2 years  .  Cellulitis    s/p right lower extremity cellulitis  . Claudication [facility])    Arterial Dopplers, July, 2012, normal with normal ABI  . Diabetes mellitus    type not specified  . Drug intolerance    Patient is intolerant to both aspirin and Plavix.  Marland Kitchen Dyslipidemia   . ED (erectile dysfunction)   . Ejection fraction    EF 55%, echo, June, 2009, hypokinesis mid/distal septal wall, technically difficult study  . Fibromyalgia   . Glaucoma    right eye  . H/O alcohol abuse    Alcohol abuse in the past  . Hypertension   . Polyneuropathy in diabetes(357.2) 10/06/2013  . Psoriatic arthritis ([place])   . Ringing in ear    June, 2012 /  carotid Dopplers, in July, 2012, 0-39% bilateral  . S/P CABG x 18 Sep 2007   2009  . Stroke due to embolism of cerebral artery ([place]) 03/08/2015   Right MCA  . Tobacco abuse      Allergies  Allergen Reactions  . Clopidogrel Hives  . Levofloxacin Hives  . Cymbalta [Duloxetine Hcl]     Headache, shortness of breath  . Paroxetine Hives  . Terbinafine Hcl Hives     Current Outpatient Medications  Medication Sig Dispense Refill  . albuterol (VENTOLIN HFA) 108 (90 Base) MCG/ACT inhaler Inhale 2 puffs into the lungs 4 (four) times daily as needed.    . ALPRAZolam (XANAX) 1 MG tablet Take 1 mg by mouth 3 (three) times daily as needed.      Marland Kitchen amLODipine (NORVASC) 10 MG  tablet Take 10 mg by mouth daily.      Marland Kitchen aspirin 81 MG tablet Take 81 mg by mouth daily.    Marland Kitchen atorvastatin (LIPITOR) 40 MG tablet TAKE 1 TABLET BY MOUTH DAILY PT NEEDS APPOINTMENT FOR FURTHER REFILLS 3 tablet 0  . benazepril (LOTENSIN) 20 MG tablet Take 20 mg by mouth daily.     . betamethasone dipropionate (DIPROLENE) 0.05 % cream Apply 1 application topically daily.     Marland Kitchen buPROPion (WELLBUTRIN) 100 MG tablet TAKE 1 TABLET BY MOUTH TWICE DAILY 60 tablet 0  . Cholecalciferol (VITAMIN D3) 50 MCG (2000 UT) capsule Take 2,000 Units by mouth daily.    . clobetasol (OLUX) 0.05 % topical foam Apply 1  application topically 2 (two) times daily.    . diclofenac sodium (VOLTAREN) 1 % GEL Apply 2 g topically 3 (three) times daily.    Marland Kitchen esomeprazole (NEXIUM) 40 MG capsule Take 40 mg by mouth daily at 12 noon.    . etanercept (ENBREL) 50 MG/ML injection Inject 50 mg into the skin once a week.      . finasteride (PROSCAR) 5 MG tablet Take 1 tablet by mouth daily.    Marland Kitchen gabapentin (NEURONTIN) 800 MG tablet Take 800 mg by mouth 4 (four) times daily.    Marland Kitchen glipiZIDE (GLUCOTROL) 5 MG tablet Take 5 mg by mouth 2 (two) times daily before a meal.     . HYDROcodone-acetaminophen (NORCO) 10-325 MG tablet Take 1 tablet by mouth 4 (four) times daily.    . hydrOXYzine (ATARAX/VISTARIL) 25 MG tablet Take 25 mg by mouth every 8 (eight) hours as needed.     Marland Kitchen KRILL OIL PO Take 1 capsule by mouth daily.    Marland Kitchen lidocaine (XYLOCAINE) 5 % ointment APPLY TO AFFECTED AREA THREE TIMES DAILY AS NEEDED 30 g 1  . metFORMIN (GLUCOPHAGE) 1000 MG tablet Take 1,000 mg by mouth 2 (two) times daily.      . metoprolol tartrate (LOPRESSOR) 25 MG tablet Take 1 tablet (25 mg total) by mouth 2 (two) times daily. 180 tablet 1  . Multiple Vitamin (MULTIVITAMIN) tablet Take 1 tablet by mouth daily.    . Omega-3 Fatty Acids (FISH OIL) 1000 MG CPDR Take 1 capsule by mouth 2 (two) times daily.    . QUEtiapine (SEROQUEL) 50 MG tablet Take 50 mg by mouth at bedtime. Takes if someone is home with him    . Sildenafil Citrate (VIAGRA PO) Take by mouth as needed.     . Tamsulosin HCl (FLOMAX) 0.4 MG CAPS Take 0.4 mg by mouth daily after supper.      No current facility-administered medications for this visit.     Past Surgical History:  Procedure Laterality Date  . CATARACT EXTRACTION Right   . CORONARY ARTERY BYPASS GRAFT       Allergies  Allergen Reactions  . Clopidogrel Hives  . Levofloxacin Hives  . Cymbalta [Duloxetine Hcl]     Headache, shortness of breath  . Paroxetine Hives  . Terbinafine Hcl Hives      Family History    Problem Relation Age of Onset  . Dementia Mother   . Diabetes Mother   . Coronary artery disease Father   . Heart attack Father   . Cancer Brother   . Coronary artery disease Other        family hx     Social History Eddie Reed reports that he has been smoking cigarettes. He started smoking about 58 years  ago. He has a 22.50 pack-year smoking history. He has never used smokeless tobacco. Eddie Reed reports current alcohol use.   Review of Systems CONSTITUTIONAL: No weight loss, fever, chills, weakness or fatigue.  HEENT: Eyes: No visual loss, blurred vision, double vision or yellow sclerae.No hearing loss, sneezing, congestion, runny nose or sore throat.  SKIN: No rash or itching.  CARDIOVASCULAR: per hpi RESPIRATORY: No shortness of breath, cough or sputum.  GASTROINTESTINAL: No anorexia, nausea, vomiting or diarrhea. No abdominal pain or blood.  GENITOURINARY: No burning on urination, no polyuria NEUROLOGICAL: No headache, dizziness, syncope, paralysis, ataxia, numbness or tingling in the extremities. No change in bowel or bladder control.  MUSCULOSKELETAL: No muscle, back pain, joint pain or stiffness.  LYMPHATICS: No enlarged nodes. No history of splenectomy.  PSYCHIATRIC: No history of depression or anxiety.  ENDOCRINOLOGIC: No reports of sweating, cold or heat intolerance. No polyuria or polydipsia.  Marland Kitchen   Physical Examination Today's Vitals   08/17/19 1435  BP: 110/60  Pulse: 76  SpO2: 96%  Weight: 199 lb 9.6 oz (90.5 kg)  Height: 5\' 10"  (1.778 m)   Body mass index is 28.64 kg/m.  Gen: resting comfortably, no acute distress HEENT: no scleral icterus, pupils equal round and reactive, no palptable cervical adenopathy,  CV: RRR, no m/r/g, no jvd Resp: Clear to auscultation bilaterally GI: abdomen is soft, non-tender, non-distended, normal bowel sounds, no hepatosplenomegaly MSK: extremities are warm, no edema.  Skin: warm, no rash Neuro:  no focal  deficits Psych: appropriate affect   Diagnostic Studies  09/2014 Carotid US Bilateral 1-39% carotid stenosis  10/2007 echo  SUMMARY - Overall left ventricular systolic function was normal. Left ventricular ejection fraction was estimated to be 55 %. Findings were suggestive of hypokinesis of the mid-distal septal wall. Left ventricular wall thickness was mildly increased.   10/2007 [place] DATA:Central aortic pressure was 106/73, mean 88.  ANGIOGRAPHIC DATA: 1. The left main was free of critical disease. 2. The LAD coursed to the apex.The LAD has been stented in the acute setting prior to a surgery.There is flow down the LAD and what appears to be competitive flow after a septal perforator.The stent has mild luminal irregularities, but is intact.It is a bare- metal stent.There is competitive filling in the distal vessel. Importantly, injection of the internal mammary reveals an internal mammary that is patent.There is perhaps a 30-40% area of bending in the vessel, and the distal LAD does not fill through the internal mammary nor does you see it well from the graft, although there is clear evidence of the competitive filling of the distal LAD territory.The major diagonal Durelle Zepeda has 80% narrowing and then 90% narrowing then bifurcates.One bifurcation has substantial disease and the graft appears to enter right just beyond this or in the other Zaina Jenkin where there is evidence of clear- cut competitive filling. 3. The saphenous vein graft to the diagonal Ava Deguire is widely patent. There is slow runoff of the graft, but that is due to size mismatch. 4. There is a small ramus intermedius that appears to be subtotally occluded.There is what appears to be competitive filling distally and this is likely due to  collateralization.Likewise, the circumflex is totally occluded and the OM graft is totally occluded. 5. The right coronary artery has multiple lesions of 50% after a stent.There is a 30% lesion and some diffuse luminal irregularities of 30-40% distally.There is then an 80% stenosis after a small PDA and then evidence of competitive filling distally.There is a vein  graft to the PDA that appears to be angiographically intact.  CONCLUSION: 1. Continued patency of the left internal mammary to the LAD, but with evidence of what appears to be some competitive filling. 2. Patent saphenous vein graft to a diagonal that enters at a bifurcation point. 3. Occluded saphenous vein graft to the distal marginal with at least collateralization of what is likely the ramus and/or circumflex itself. 4. Continued patency of the saphenous vein graft to the distal posterolateral segment.  Cath DISPOSITION:At the present time, cardiac rehab and medical therapy will be recommended.   02/2015 [facility] Carotid US IMPRESSION: Bilateral ASVD. No significant stenosis.  Stenosis is validated by velocity measurements with corresponding angiographic measurements and velocity criteria extrapolated in a process based on that defined by the Society of Radiologists in Ultrasound.  02/2015 [facility] Echo Interpretation Summary A complete portable two-dimensional transthoracic echocardiogram was performed. The study was technically difficult. The left ventricle is mildly dilated. There is mild concentric left ventricular hypertrophy. The aortic valve is trileaflet. There is anterior wall hypokinesis. Unable to adequately determine diastolic dysfunction. The left atrium is mildly dilated. The left ventricular ejection fraction is normal (50-55%).  Left Ventricle The left ventricle is mildly dilated.  There is mild concentric left ventricular hypertrophy. There is anterior wall hypokinesis. The left ventricular ejection fraction is normal (50-55%). Unable to adequately determine diastolic dysfunction.   Right Ventricle The right ventricle is not well visualized. The right ventricle is grossly normal in size and function.  Atria The left atrium is mildly dilated. The right atrium is normal. The IVC is normal in size.  Mitral Valve There is mild mitral annular calcification. There is no mitral regurgitation.   Tricuspid Valve The tricuspid valve is grossly normal. There is no tricuspid regurgitation.  Aortic Valve The aortic valve opens well. The aortic valve is trileaflet. There is no aortic regurgitation present.  Pulmonic Valve The pulmonic valve is not well visualized.  Vessels The aortic root is normal in diameter.  Pericardium There is no pericardial effusion.  MMode/2D Measurements &Calculations IVSd: 1.3 cmLVIDd: 5.0 cm LVIDs: 3.6 cm LVPWd: 1.2 cm  _____________________________________________________________ LV mass(C)d: 257.0 gramsAo root diam: 3.7 cm  LV mass(C)dI: 120.6 grams/m2Ao root area: 10.6 cm2 LA dimension: 4.1 cm  Doppler Measurements &Calculations MV E max vel: 90.8 cm/secMV dec time: 0.26 sec MV A max vel: 74.0 cm/sec MV E/A: 1.2   06/2018 AAA Screen Summary: Abdominal Aorta: No evidence of an abdominal aortic aneurysm was visualized. The largest aortic measurement is 2.8 cm.   01/2019 nuclear stress  No diagnostic ST segment changes over baseline. No significant arrhythmias.  Large, moderate to severe intensity,  anteroseptal defect most prominent at the apex but extending to the base largely in the septal distribution. This region is fixed and consistent with prior infarct scar, no large ischemic territories.  This is a high risk study based on infarct scar size and reduced LVEF.  Nuclear stress EF: 36%.    Assessment and Plan   1. CAD -no recent symptoms, continue current meds  2. Hyperipidemia -continue statin   3. HTN - at goal, continue current meds      Arnoldo Lenis, M.D.

## 2019-08-18 ENCOUNTER — Encounter: Payer: Self-pay | Admitting: *Deleted

## 2020-03-21 ENCOUNTER — Telehealth: Payer: Self-pay | Admitting: Cardiology

## 2020-03-21 NOTE — Telephone Encounter (Signed)
New message     *STAT* If patient is at the pharmacy, call can be transferred to refill team.   1. Which medications need to be refilled? (please list name of each medication and dose if known) nitro   2. Which pharmacy/location (including street and city if local pharmacy) is medication to be sent to? [name]  3. Do they need a 30 day or 90 day supply? 30  - his prescription is 69 years old

## 2020-03-22 MED ORDER — NITROGLYCERIN 0.4 MG SL SUBL: 0.4000 mg | SUBLINGUAL_TABLET | SUBLINGUAL | 3 refills | Status: DC | PRN

## 2020-03-22 NOTE — Telephone Encounter (Signed)
Patient informed and verbalized understanding of plan. 

## 2020-03-22 NOTE — Telephone Encounter (Signed)
Can fill nitro, just be sure he is aware not to take around the same time he takes his viagra which can cause blood pressure to get very low   Zandra Abts MD

## 2020-04-26 ENCOUNTER — Ambulatory Visit: Payer: Medicare Other | Admitting: Urology

## 2020-06-07 ENCOUNTER — Ambulatory Visit: Payer: Medicare Other | Admitting: Cardiology

## 2020-06-09 ENCOUNTER — Ambulatory Visit: Payer: Medicare Other | Admitting: Urology

## 2020-07-21 ENCOUNTER — Ambulatory Visit: Payer: Medicare Other | Admitting: Urology

## 2020-07-21 DIAGNOSIS — N400 Enlarged prostate without lower urinary tract symptoms: Secondary | ICD-10-CM

## 2020-08-14 ENCOUNTER — Telehealth: Payer: Self-pay

## 2020-08-14 NOTE — Telephone Encounter (Signed)
Pt called wanting to verify when his next appointment was . Date given.

## 2020-08-16 ENCOUNTER — Ambulatory Visit: Payer: Medicare Other | Admitting: Cardiology

## 2020-08-16 NOTE — Progress Notes (Deleted)
Clinical Summary Mr. Mcaffee is a 70 y.o.male seen today for follow up of the following medical problems.  1. CAD - history of prior CABG. Last cath 2009 as described below, patent grafts other than occluded SVG-marginal however there were collaterals and medically managed - reports allergy to plavix. Reports ASA upsets stomach. Recently back on ASA and having some burning feeling at times in epigastrium.Has tolerated combined with PPI.    01/2019 nuclear stress: prior scar, no current ischemia  - limited physically since knee surgery - no recent chest pain. Some mild SOB - compliant with meds     2. Hyperlipidemia -labs followed by pcp - he is compliant with statin   3. DM2 - followed by pcp   4.History ofCVA - presented with acute left sided weakness to [place]. Did not receive tPA, transferred to [facility] - MRI of the brain without contrast on showed Multiple small acute/recent embolic type infarcts within the MCA territory of the right frontal and parietal lobes with no associated hemorrhage or mass effect - Echocardiogram with Doppler complete with bubble study was obtained on 02/18/15, showed no cardiac etiology for the stroke.Left ventricular ejection fraction was 55 %. - reports weakness has resolved.        Past Medical History:  Diagnosis Date  . Allergy history, drug    plavix  . Arm pain    continued discomfort in his arm- seeing Dr. Annie Main  . CAD (coronary artery disease)    catheterizzation. 11/10/07. patent LIMA to the LAD with competitive filling, patent SVG to diagonal that enters a bifurcation point, occluded SVG to the distal marginal w/some collateralization, patent SVG to distal posterior lateral. medical therapy recommended.  EF 55% echo. June,2009. hypokinesis of the mid and distal septal wall. technically difficult study.  . Carotid artery disease ([place])    Doppler, July, 2012, 0-39% bilateral, followup 2  years  . Cellulitis    s/p right lower extremity cellulitis  . Claudication [facility])    Arterial Dopplers, July, 2012, normal with normal ABI  . Diabetes mellitus    type not specified  . Drug intolerance    Patient is intolerant to both aspirin and Plavix.  Marland Kitchen Dyslipidemia   . ED (erectile dysfunction)   . Ejection fraction    EF 55%, echo, June, 2009, hypokinesis mid/distal septal wall, technically difficult study  . Fibromyalgia   . Glaucoma    right eye  . H/O alcohol abuse    Alcohol abuse in the past  . Hypertension   . Polyneuropathy in diabetes(357.2) 10/06/2013  . Psoriatic arthritis ([place])   . Ringing in ear    June, 2012 /  carotid Dopplers, in July, 2012, 0-39% bilateral  . S/P CABG x 18 Sep 2007   2009  . Stroke due to embolism of cerebral artery ([place]) 03/08/2015   Right MCA  . Tobacco abuse      Allergies  Allergen Reactions  . Clopidogrel Hives  . Levofloxacin Hives  . Cymbalta [Duloxetine Hcl]     Headache, shortness of breath  . Paroxetine Hives  . Terbinafine Hcl Hives     Current Outpatient Medications  Medication Sig Dispense Refill  . albuterol (VENTOLIN HFA) 108 (90 Base) MCG/ACT inhaler Inhale 2 puffs into the lungs 4 (four) times daily as needed.    . ALPRAZolam (XANAX) 1 MG tablet Take 1 mg by mouth 3 (three) times daily as needed.      Marland Kitchen amLODipine (NORVASC) 5  MG tablet Take 5 mg by mouth daily.    Marland Kitchen aspirin 81 MG tablet Take 81 mg by mouth daily.    Marland Kitchen atorvastatin (LIPITOR) 20 MG tablet Take 20 mg by mouth at bedtime.    . benazepril (LOTENSIN) 20 MG tablet Take 20 mg by mouth daily.     . betamethasone dipropionate (DIPROLENE) 0.05 % cream Apply 1 application topically daily.     . Cholecalciferol (VITAMIN D3) 50 MCG (2000 UT) capsule Take 2,000 Units by mouth daily.    . clobetasol (OLUX) 0.05 % topical foam Apply 1 application topically 2 (two) times daily.    . diclofenac sodium (VOLTAREN) 1 % GEL Apply 2 g topically 3 (three) times daily.     Marland Kitchen esomeprazole (NEXIUM) 40 MG capsule Take 40 mg by mouth daily at 12 noon.    . etanercept (ENBREL) 50 MG/ML injection Inject 50 mg into the skin once a week.      . finasteride (PROSCAR) 5 MG tablet Take 1 tablet by mouth daily.    Marland Kitchen gabapentin (NEURONTIN) 800 MG tablet Take 800 mg by mouth 4 (four) times daily.    Marland Kitchen glipiZIDE (GLUCOTROL) 5 MG tablet Take 5 mg by mouth 2 (two) times daily before a meal.     . HYDROcodone-acetaminophen (NORCO) 10-325 MG tablet Take 1 tablet by mouth 4 (four) times daily.    . hydrOXYzine (ATARAX/VISTARIL) 25 MG tablet Take 25 mg by mouth every 8 (eight) hours as needed.     Marland Kitchen KRILL OIL PO Take 1 capsule by mouth daily.    Marland Kitchen lidocaine (XYLOCAINE) 5 % ointment APPLY TO AFFECTED AREA THREE TIMES DAILY AS NEEDED 30 g 1  . metFORMIN (GLUCOPHAGE) 1000 MG tablet Take 1,000 mg by mouth 2 (two) times daily.      . metoprolol tartrate (LOPRESSOR) 25 MG tablet Take 1 tablet (25 mg total) by mouth 2 (two) times daily. 180 tablet 1  . Multiple Vitamin (MULTIVITAMIN) tablet Take 1 tablet by mouth daily.    . nitroGLYCERIN (NITROSTAT) 0.4 MG SL tablet Place 1 tablet (0.4 mg total) under the tongue every 5 (five) minutes x 3 doses as needed for chest pain (if no relief after 2nd dose, proceed to the ED for an evaluation or call 911). 25 tablet 3  . Omega-3 Fatty Acids (FISH OIL) 1000 MG CPDR Take 1 capsule by mouth 2 (two) times daily.    . QUEtiapine (SEROQUEL) 50 MG tablet Take 50 mg by mouth at bedtime as needed. Takes if someone is home with him    . Sildenafil Citrate (VIAGRA PO) Take by mouth as needed.     . Tamsulosin HCl (FLOMAX) 0.4 MG CAPS Take 0.4 mg by mouth daily after supper.      No current facility-administered medications for this visit.     Past Surgical History:  Procedure Laterality Date  . CATARACT EXTRACTION Right   . CORONARY ARTERY BYPASS GRAFT       Allergies  Allergen Reactions  . Clopidogrel Hives  . Levofloxacin Hives  . Cymbalta  [Duloxetine Hcl]     Headache, shortness of breath  . Paroxetine Hives  . Terbinafine Hcl Hives      Family History  Problem Relation Age of Onset  . Dementia Mother   . Diabetes Mother   . Coronary artery disease Father   . Heart attack Father   . Cancer Brother   . Coronary artery disease Other  family hx     Social History Mr. Delamater reports that he has been smoking cigarettes. He started smoking about 59 years ago. He has a 22.50 pack-year smoking history. He has never used smokeless tobacco. Mr. Cacciola reports current alcohol use.   Review of Systems CONSTITUTIONAL: No weight loss, fever, chills, weakness or fatigue.  HEENT: Eyes: No visual loss, blurred vision, double vision or yellow sclerae.No hearing loss, sneezing, congestion, runny nose or sore throat.  SKIN: No rash or itching.  CARDIOVASCULAR:  RESPIRATORY: No shortness of breath, cough or sputum.  GASTROINTESTINAL: No anorexia, nausea, vomiting or diarrhea. No abdominal pain or blood.  GENITOURINARY: No burning on urination, no polyuria NEUROLOGICAL: No headache, dizziness, syncope, paralysis, ataxia, numbness or tingling in the extremities. No change in bowel or bladder control.  MUSCULOSKELETAL: No muscle, back pain, joint pain or stiffness.  LYMPHATICS: No enlarged nodes. No history of splenectomy.  PSYCHIATRIC: No history of depression or anxiety.  ENDOCRINOLOGIC: No reports of sweating, cold or heat intolerance. No polyuria or polydipsia.  Marland Kitchen   Physical Examination There were no vitals filed for this visit. There were no vitals filed for this visit.  Gen: resting comfortably, no acute distress HEENT: no scleral icterus, pupils equal round and reactive, no palptable cervical adenopathy,  CV Resp: Clear to auscultation bilaterally GI: abdomen is soft, non-tender, non-distended, normal bowel sounds, no hepatosplenomegaly MSK: extremities are warm, no edema.  Skin: warm, no rash Neuro:  no focal  deficits Psych: appropriate affect   Diagnostic Studies 09/2014 Carotid US Bilateral 1-39% carotid stenosis  10/2007 echo  SUMMARY - Overall left ventricular systolic function was normal. Left ventricular ejection fraction was estimated to be 55 %. Findings were suggestive of hypokinesis of the mid-distal septal wall. Left ventricular wall thickness was mildly increased.   10/2007 [place] DATA:Central aortic pressure was 106/73, mean 88.  ANGIOGRAPHIC DATA: 1. The left main was free of critical disease. 2. The LAD coursed to the apex.The LAD has been stented in the acute setting prior to a surgery.There is flow down the LAD and what appears to be competitive flow after a septal perforator.The stent has mild luminal irregularities, but is intact.It is a bare- metal stent.There is competitive filling in the distal vessel. Importantly, injection of the internal mammary reveals an internal mammary that is patent.There is perhaps a 30-40% area of bending in the vessel, and the distal LAD does not fill through the internal mammary nor does you see it well from the graft, although there is clear evidence of the competitive filling of the distal LAD territory.The major diagonal Tuwanda Vokes has 80% narrowing and then 90% narrowing then bifurcates.One bifurcation has substantial disease and the graft appears to enter right just beyond this or in the other Mahiya Kercheval where there is evidence of clear- cut competitive filling. 3. The saphenous vein graft to the diagonal Ghazi Rumpf is widely patent. There is slow runoff of the graft, but that is due to size mismatch. 4. There is a small ramus intermedius that appears to be subtotally occluded.There is what appears to be competitive filling distally and this is likely due to  collateralization.Likewise, the circumflex is totally occluded and the OM graft is totally occluded. 5. The right coronary artery has multiple lesions of 50% after a stent.There is a 30% lesion and some diffuse luminal irregularities of 30-40% distally.There is then an 80% stenosis after a small PDA and then evidence of competitive filling distally.There is a vein graft to the PDA that  appears to be angiographically intact.  CONCLUSION: 1. Continued patency of the left internal mammary to the LAD, but with evidence of what appears to be some competitive filling. 2. Patent saphenous vein graft to a diagonal that enters at a bifurcation point. 3. Occluded saphenous vein graft to the distal marginal with at least collateralization of what is likely the ramus and/or circumflex itself. 4. Continued patency of the saphenous vein graft to the distal posterolateral segment.  Cath DISPOSITION:At the present time, cardiac rehab and medical therapy will be recommended.   02/2015 [place] Carotid US IMPRESSION: Bilateral ASVD. No significant stenosis.  Stenosis is validated by velocity measurements with corresponding angiographic measurements and velocity criteria extrapolated in a process based on that defined by the Society of Radiologists in Ultrasound.  02/2015 [facility] Echo Interpretation Summary A complete portable two-dimensional transthoracic echocardiogram was performed. The study was technically difficult. The left ventricle is mildly dilated. There is mild concentric left ventricular hypertrophy. The aortic valve is trileaflet. There is anterior wall hypokinesis. Unable to adequately determine diastolic dysfunction. The left atrium is mildly dilated. The left ventricular ejection fraction is normal (50-55%).  Left Ventricle The left ventricle is mildly dilated.  There is mild concentric left ventricular hypertrophy. There is anterior wall hypokinesis. The left ventricular ejection fraction is normal (50-55%). Unable to adequately determine diastolic dysfunction.   Right Ventricle The right ventricle is not well visualized. The right ventricle is grossly normal in size and function.  Atria The left atrium is mildly dilated. The right atrium is normal. The IVC is normal in size.  Mitral Valve There is mild mitral annular calcification. There is no mitral regurgitation.   Tricuspid Valve The tricuspid valve is grossly normal. There is no tricuspid regurgitation.  Aortic Valve The aortic valve opens well. The aortic valve is trileaflet. There is no aortic regurgitation present.  Pulmonic Valve The pulmonic valve is not well visualized.  Vessels The aortic root is normal in diameter.  Pericardium There is no pericardial effusion.  MMode/2D Measurements &Calculations IVSd: 1.3 cmLVIDd: 5.0 cm LVIDs: 3.6 cm LVPWd: 1.2 cm  _____________________________________________________________ LV mass(C)d: 257.0 gramsAo root diam: 3.7 cm  LV mass(C)dI: 120.6 grams/m2Ao root area: 10.6 cm2 LA dimension: 4.1 cm  Doppler Measurements &Calculations MV E max vel: 90.8 cm/secMV dec time: 0.26 sec MV A max vel: 74.0 cm/sec MV E/A: 1.2   06/2018 AAA Screen Summary: Abdominal Aorta: No evidence of an abdominal aortic aneurysm was visualized. The largest aortic measurement is 2.8 cm.   01/2019 nuclear stress  No diagnostic ST segment changes over baseline. No significant arrhythmias.  Large, moderate to severe intensity,  anteroseptal defect most prominent at the apex but extending to the base largely in the septal distribution. This region is fixed and consistent with prior infarct scar, no large ischemic territories.  This is a high risk study based on infarct scar size and reduced LVEF.  Nuclear stress EF: 36%.       Assessment and Plan  1. CAD -no recent symptoms, continue current meds  2. Hyperipidemia -continue statin   3. HTN - at goal, continue current meds      Arnoldo Lenis, M.D.

## 2020-09-11 ENCOUNTER — Ambulatory Visit (INDEPENDENT_AMBULATORY_CARE_PROVIDER_SITE_OTHER): Payer: Medicare Other | Admitting: Urology

## 2020-09-11 ENCOUNTER — Encounter: Payer: Self-pay | Admitting: Urology

## 2020-09-11 ENCOUNTER — Other Ambulatory Visit: Payer: Self-pay

## 2020-09-11 VITALS — BP 110/64 | HR 73 | Temp 97.9°F | Ht 70.0 in | Wt 185.0 lb

## 2020-09-11 DIAGNOSIS — R339 Retention of urine, unspecified: Secondary | ICD-10-CM | POA: Diagnosis not present

## 2020-09-11 DIAGNOSIS — N401 Enlarged prostate with lower urinary tract symptoms: Secondary | ICD-10-CM | POA: Diagnosis not present

## 2020-09-11 LAB — BLADDER SCAN AMB NON-IMAGING: Scan Result: 610

## 2020-09-11 MED ORDER — SILODOSIN 8 MG PO CAPS: 8.0000 mg | ORAL_CAPSULE | Freq: Every day | ORAL | 11 refills | Status: DC

## 2020-09-11 NOTE — Patient Instructions (Signed)

## 2020-09-11 NOTE — Progress Notes (Signed)
post void residual= 610  Urological Symptom Review  Patient is experiencing the following symptoms: Frequent urination Hard to postpone urination Burning/pain with urination Get up at night to urinate Leakage of urine Stream starts and stops Trouble starting stream Weak stream Erection problems (male only)   Review of Systems  Gastrointestinal (upper)  : Negative for upper GI symptoms  Gastrointestinal (lower) : Negative for lower GI symptoms  Constitutional : Negative for symptoms  Skin: Negative for skin symptoms  Eyes: Negative for eye symptoms  Ear/Nose/Throat : Negative for Ear/Nose/Throat symptoms  Hematologic/Lymphatic: Negative for Hematologic/Lymphatic symptoms  Cardiovascular : Negative for cardiovascular symptoms  Respiratory : Negative for respiratory symptoms  Endocrine: Negative for endocrine symptoms  Musculoskeletal: Negative for musculoskeletal symptoms  Neurological: Negative for neurological symptoms  Psychologic: Anxiety

## 2020-09-11 NOTE — Progress Notes (Signed)
09/11/2020 3:56 PM   Valentina Shaggy Sr. [birth year] [id number]  Referring provider: Practice, [facility],  [place] [address]  Difficulty urinating  HPI: Mr Gallicchio is a 70yo here for evaluation of difficulty urinating. For over 5 years he has noted difficulty with a weak urinary stream, urinary hesitancy, nocturia, and a feeling of incomplete emptying. He has been on flomax and finasteride but this failed to improve his LUTS. He was then started on uroxatral which also failed to improve his LUTYS.  He has occasional urge incontinence. Nocturia 4-5x. Stream is very weak.  PVR 530cc. He is unhappy with his urination.      PMH: Past Medical History:  Diagnosis Date  . Allergy history, drug    plavix  . Arm pain    continued discomfort in his arm- seeing Dr. Annie Main  . CAD (coronary artery disease)    catheterizzation. 11/10/07. patent LIMA to the LAD with competitive filling, patent SVG to diagonal that enters a bifurcation point, occluded SVG to the distal marginal w/some collateralization, patent SVG to distal posterior lateral. medical therapy recommended.  EF 55% echo. June,2009. hypokinesis of the mid and distal septal wall. technically difficult study.  . Carotid artery disease ([place])    Doppler, July, 2012, 0-39% bilateral, followup 2 years  . Cellulitis    s/p right lower extremity cellulitis  . Claudication [facility])    Arterial Dopplers, July, 2012, normal with normal ABI  . Diabetes mellitus    type not specified  . Drug intolerance    Patient is intolerant to both aspirin and Plavix.  Marland Kitchen Dyslipidemia   . ED (erectile dysfunction)   . Ejection fraction    EF 55%, echo, June, 2009, hypokinesis mid/distal septal wall, technically difficult study  . Fibromyalgia   . Glaucoma    right eye  . H/O alcohol abuse    Alcohol abuse in the past  . Hypertension   . Polyneuropathy in diabetes(357.2) 10/06/2013  . Psoriatic arthritis ([place])   . Ringing in ear     June, 2012 /  carotid Dopplers, in July, 2012, 0-39% bilateral  . S/P CABG x 18 Sep 2007   2009  . Stroke due to embolism of cerebral artery ([place]) 03/08/2015   Right MCA  . Tobacco abuse     Surgical History: Past Surgical History:  Procedure Laterality Date  . CATARACT EXTRACTION Right   . CORONARY ARTERY BYPASS GRAFT      Home Medications:  Allergies as of 09/11/2020      Reactions   Clopidogrel Hives   Levofloxacin Hives   Cymbalta [duloxetine Hcl]    Headache, shortness of breath   Paroxetine Hives   Terbinafine Hcl Hives      Medication List       Accurate as of September 11, 2020  3:56 PM. If you have any questions, ask your nurse or doctor.        albuterol 108 (90 Base) MCG/ACT inhaler Commonly known as: VENTOLIN HFA Inhale 2 puffs into the lungs 4 (four) times daily as needed.   alfuzosin 10 MG 24 hr tablet Commonly known as: UROXATRAL Take 10 mg by mouth daily.   ALPRAZolam 1 MG tablet Commonly known as: XANAX Take 1 mg by mouth 3 (three) times daily as needed.   amLODipine 5 MG tablet Commonly known as: NORVASC Take 5 mg by mouth daily.   aspirin 81 MG tablet Take 81 mg by mouth daily.  atorvastatin 20 MG tablet Commonly known as: LIPITOR Take 20 mg by mouth at bedtime.   benazepril 20 MG tablet Commonly known as: LOTENSIN Take 20 mg by mouth daily.   betamethasone dipropionate 0.05 % cream Apply 1 application topically daily.   clobetasol 0.05 % topical foam Commonly known as: OLUX Apply 1 application topically 2 (two) times daily.   diclofenac sodium 1 % Gel Commonly known as: VOLTAREN Apply 2 g topically 3 (three) times daily.   esomeprazole 40 MG capsule Commonly known as: NEXIUM Take 40 mg by mouth daily at 12 noon.   etanercept 50 MG/ML injection Commonly known as: ENBREL Inject 50 mg into the skin once a week.   finasteride 5 MG tablet Commonly known as: PROSCAR Take 1 tablet by mouth daily.   Fish Oil 1000 MG Cpdr Take  1 capsule by mouth 2 (two) times daily.   fluticasone 50 MCG/ACT nasal spray Commonly known as: FLONASE Place 2 sprays into both nostrils daily.   gabapentin 800 MG tablet Commonly known as: NEURONTIN Take 800 mg by mouth 4 (four) times daily.   glipiZIDE 5 MG tablet Commonly known as: GLUCOTROL Take 5 mg by mouth 2 (two) times daily before a meal.   HYDROcodone-acetaminophen 10-325 MG tablet Commonly known as: NORCO Take 1 tablet by mouth 4 (four) times daily.   hydrOXYzine 25 MG tablet Commonly known as: ATARAX/VISTARIL Take 25 mg by mouth every 8 (eight) hours as needed.   ketoconazole 2 % cream Commonly known as: NIZORAL Apply topically 2 (two) times daily. to affected area(s)   KRILL OIL PO Take 1 capsule by mouth daily.   lidocaine 5 % ointment Commonly known as: XYLOCAINE APPLY TO AFFECTED AREA THREE TIMES DAILY AS NEEDED   metFORMIN 1000 MG tablet Commonly known as: GLUCOPHAGE Take 1,000 mg by mouth 2 (two) times daily.   metoprolol tartrate 25 MG tablet Commonly known as: LOPRESSOR Take 1 tablet (25 mg total) by mouth 2 (two) times daily.   multivitamin tablet Take 1 tablet by mouth daily.   nitroGLYCERIN 0.4 MG SL tablet Commonly known as: NITROSTAT Place 1 tablet (0.4 mg total) under the tongue every 5 (five) minutes x 3 doses as needed for chest pain (if no relief after 2nd dose, proceed to the ED for an evaluation or call 911).   QUEtiapine 50 MG tablet Commonly known as: SEROQUEL Take 50 mg by mouth at bedtime as needed. Takes if someone is home with him   tamsulosin 0.4 MG Caps capsule Commonly known as: FLOMAX Take 0.4 mg by mouth daily after supper.   VIAGRA PO Take by mouth as needed.   Vitamin D3 50 MCG (2000 UT) capsule Take 2,000 Units by mouth daily.       Allergies:  Allergies  Allergen Reactions  . Clopidogrel Hives  . Levofloxacin Hives  . Cymbalta [Duloxetine Hcl]     Headache, shortness of breath  . Paroxetine Hives   . Terbinafine Hcl Hives    Family History: Family History  Problem Relation Age of Onset  . Dementia Mother   . Diabetes Mother   . Coronary artery disease Father   . Heart attack Father   . Cancer Brother   . Coronary artery disease Other        family hx    Social History:  reports that he has been smoking cigarettes. He started smoking about 59 years ago. He has a 22.50 pack-year smoking history. He has never used smokeless  tobacco. He reports current alcohol use. He reports that he does not use drugs.  ROS: All other review of systems were reviewed and are negative except what is noted above in HPI  Physical Exam: BP 110/64   Pulse 73   Temp 97.9 F (36.6 C)   Ht 5\' 10"  (1.778 m)   Wt 185 lb (83.9 kg)   BMI 26.54 kg/m   Constitutional:  Alert and oriented, No acute distress. HEENT: [place] AT, moist mucus membranes.  Trachea midline, no masses. Cardiovascular: No clubbing, cyanosis, or edema. Respiratory: Normal respiratory effort, no increased work of breathing. GI: Abdomen is soft, nontender, nondistended, no abdominal masses GU: No CVA tenderness. Circumcised phallus. No masses/lesions on penis, testis, scrotum. Prostate 40g smooth no nodules no induration.  Lymph: No cervical or inguinal lymphadenopathy. Skin: No rashes, bruises or suspicious lesions. Neurologic: Grossly intact, no focal deficits, moving all 4 extremities. Psychiatric: Normal mood and affect.  Laboratory Data: Lab Results  Component Value Date   WBC 11.3 (H) 11/11/2007   HGB 14.0 11/11/2007   HCT 42.4 11/11/2007   MCV 92.5 11/11/2007   PLT 275 11/11/2007    Lab Results  Component Value Date   CREATININE 0.97 11/11/2007    No results found for: PSA  No results found for: TESTOSTERONE  Lab Results  Component Value Date   HGBA1C (H) 11/09/2007    7.2 (NOTE)   The ADA recommends the following therapeutic goals for glycemic   control related to Hgb A1C measurement:   Goal of Therapy:   <  7.0% Hgb A1C   Action Suggested:  > 8.0% Hgb A1C   Ref:  Diabetes Care, 22, Suppl. 1, 1999    Urinalysis No results found for: COLORURINE, APPEARANCEUR, LABSPEC, PHURINE, GLUCOSEU, HGBUR, BILIRUBINUR, KETONESUR, PROTEINUR, UROBILINOGEN, NITRITE, LEUKOCYTESUR  No results found for: LABMICR, [place], RBCUA, LABEPIT, MUCUS, BACTERIA  Pertinent Imaging:  No results found for this or any previous visit.  No results found for this or any previous visit.  No results found for this or any previous visit.  No results found for this or any previous visit.  No results found for this or any previous visit.  No results found for this or any previous visit.  No results found for this or any previous visit.  No results found for this or any previous visit.   Assessment & Plan:    1. Benign localized prostatic hyperplasia with lower urinary tract symptoms (LUTS) -We will start rapaflo 8mg  qhs, if this fails to improve his LUTS we will proceed with cystoscopy  2. Urinary retention -rapaflo 8mg  qhs   No follow-ups on file.  Nicolette Bang, MD  [facility] [place]

## 2020-09-12 LAB — MICROSCOPIC EXAMINATION
RBC, Urine: NONE SEEN /hpf (ref 0–2)
Renal Epithel, UA: NONE SEEN /hpf

## 2020-09-12 LAB — URINALYSIS, ROUTINE W REFLEX MICROSCOPIC
Bilirubin, UA: NEGATIVE
Glucose, UA: NEGATIVE
Ketones, UA: NEGATIVE
Nitrite, UA: NEGATIVE
Protein,UA: NEGATIVE
RBC, UA: NEGATIVE
Specific Gravity, UA: 1.02 (ref 1.005–1.030)
Urobilinogen, Ur: 0.2 mg/dL (ref 0.2–1.0)
pH, UA: 6 (ref 5.0–7.5)

## 2020-10-25 ENCOUNTER — Other Ambulatory Visit: Payer: Self-pay

## 2020-10-25 ENCOUNTER — Ambulatory Visit (INDEPENDENT_AMBULATORY_CARE_PROVIDER_SITE_OTHER): Payer: Medicare Other | Admitting: Urology

## 2020-10-25 VITALS — BP 114/60 | HR 90 | Ht 70.0 in

## 2020-10-25 DIAGNOSIS — N401 Enlarged prostate with lower urinary tract symptoms: Secondary | ICD-10-CM

## 2020-10-25 DIAGNOSIS — R339 Retention of urine, unspecified: Secondary | ICD-10-CM

## 2020-10-25 LAB — URINALYSIS, ROUTINE W REFLEX MICROSCOPIC
Bilirubin, UA: NEGATIVE
Glucose, UA: NEGATIVE
Ketones, UA: NEGATIVE
Leukocytes,UA: NEGATIVE
Nitrite, UA: NEGATIVE
Protein,UA: NEGATIVE
RBC, UA: NEGATIVE
Specific Gravity, UA: 1.015 (ref 1.005–1.030)
Urobilinogen, Ur: 0.2 mg/dL (ref 0.2–1.0)
pH, UA: 6.5 (ref 5.0–7.5)

## 2020-10-25 MED ORDER — SULFAMETHOXAZOLE-TRIMETHOPRIM 800-160 MG PO TABS: 1.0000 | ORAL_TABLET | Freq: Once | ORAL | 0 refills | Status: AC

## 2020-10-25 NOTE — Progress Notes (Signed)
Urological Symptom Review  Patient is experiencing the following symptoms: Frequent urination Hard to postpone urination Burning/pain with urination Get up at night to urinate Leakage of urine Stream starts and stops Trouble starting stream Have to strain to urinate Weak stream Erection problems (male only)   Review of Systems  Gastrointestinal (upper)  : Negative for upper GI symptoms  Gastrointestinal (lower) : Diarrhea Constipation  Constitutional : Fatigue  Skin: Negative for skin symptoms  Eyes: Negative for eye symptoms  Ear/Nose/Throat : Sinus problems  Hematologic/Lymphatic: Negative for Hematologic/Lymphatic symptoms  Cardiovascular : Negative for cardiovascular symptoms  Respiratory : Cough Shortness of breath  Endocrine: Excessive thirst  Musculoskeletal: Joint pain  Neurological: Negative for neurological symptoms  Psychologic: Depression

## 2020-10-25 NOTE — Progress Notes (Signed)
   10/25/20  CC: followup BPH   HPI: Mr Vantine is a 70yo here for followup for BPH. He has failed multiple alpha blockers. He has severe LUTS Blood pressure 114/60, pulse 90, height 5\' 10"  (1.778 m). NED. A&Ox3.   No respiratory distress   Abd soft, NT, ND Normal phallus with bilateral descended testicles  Cystoscopy Procedure Note  Patient identification was confirmed, informed consent was obtained, and patient was prepped using Betadine solution.  Lidocaine jelly was administered per urethral meatus.     Pre-Procedure: - Inspection reveals a normal caliber ureteral meatus.  Procedure: The flexible cystoscope was introduced without difficulty - No urethral strictures/lesions are present. - Enlarged prostate no median lobe - Normal bladder neck - Bilateral ureteral orifices identified - Bladder mucosa  reveals no ulcers, tumors, or lesions - No bladder stones - No trabeculation  Retroflexion shows No intravesical prostatic protrusion   Post-Procedure: - Patient tolerated the procedure well  Assessment/ Plan: We discussed the management of his BPH including continued medical therapy, Rezum, Urolift, TURP and simple prostatectomy. After discussing the options the patient has elected to proceed with Urolift. Risks/benefits/alternatives discussed.   No follow-ups on file.  Nicolette Bang, MD

## 2020-11-07 ENCOUNTER — Encounter: Payer: Self-pay | Admitting: Urology

## 2020-11-07 NOTE — Patient Instructions (Signed)
Benign Prostatic Hyperplasia  Benign prostatic hyperplasia (BPH) is an enlarged prostate gland that is caused by the normal aging process and not by cancer. The prostate is a walnut-sized gland that is involved in the production of semen. It is located in front of the rectum and below the bladder. The bladder stores urine and the urethra is the tube that carries the urine out of the body. The prostate may get bigger asa man gets older. An enlarged prostate can press on the urethra. This can make it harder to pass urine. The build-up of urine in the bladder can cause infection. Back pressure and infection may progress to bladder damage and kidney (renal) failure. What are the causes? This condition is part of a normal aging process. However, not all men develop problems from this condition. If the prostate enlarges away from the urethra, urine flow will not be blocked. If it enlarges toward the urethra andcompresses it, there will be problems passing urine. What increases the risk? This condition is more likely to develop in men over the age of 50 years. What are the signs or symptoms? Symptoms of this condition include: Getting up often during the night to urinate. Needing to urinate frequently during the day. Difficulty starting urine flow. Decrease in size and strength of your urine stream. Leaking (dribbling) after urinating. Inability to pass urine. This needs immediate treatment. Inability to completely empty your bladder. Pain when you pass urine. This is more common if there is also an infection. Urinary tract infection (UTI). How is this diagnosed? This condition is diagnosed based on your medical history, a physical exam, and your symptoms. Tests will also be done, such as: A post-void bladder scan. This measures any amount of urine that may remain in your bladder after you finish urinating. A digital rectal exam. In a rectal exam, your health care provider checks your prostate by  putting a lubricated, gloved finger into your rectum to feel the back of your prostate gland. This exam detects the size of your gland and any abnormal lumps or growths. An exam of your urine (urinalysis). A prostate specific antigen (PSA) screening. This is a blood test used to screen for prostate cancer. An ultrasound. This test uses sound waves to electronically produce a picture of your prostate gland. Your health care provider may refer you to a specialist in kidney and prostate diseases (urologist). How is this treated? Once symptoms begin, your health care provider will monitor your condition (active surveillance or watchful waiting). Treatment for this condition will depend on the severity of your condition. Treatment may include: Observation and yearly exams. This may be the only treatment needed if your condition and symptoms are mild. Medicines to relieve your symptoms, including: Medicines to shrink the prostate. Medicines to relax the muscle of the prostate. Surgery in severe cases. Surgery may include: Prostatectomy. In this procedure, the prostate tissue is removed completely through an open incision or with a laparoscope or robotics. Transurethral resection of the prostate (TURP). In this procedure, a tool is inserted through the opening at the tip of the penis (urethra). It is used to cut away tissue of the inner core of the prostate. The pieces are removed through the same opening of the penis. This removes the blockage. Transurethral incision (TUIP). In this procedure, small cuts are made in the prostate. This lessens the prostate's pressure on the urethra. Transurethral microwave thermotherapy (TUMT). This procedure uses microwaves to create heat. The heat destroys and removes a small   amount of prostate tissue. Transurethral needle ablation (TUNA). This procedure uses radio frequencies to destroy and remove a small amount of prostate tissue. Interstitial laser coagulation (ILC).  This procedure uses a laser to destroy and remove a small amount of prostate tissue. Transurethral electrovaporization (TUVP). This procedure uses electrodes to destroy and remove a small amount of prostate tissue. Prostatic urethral lift. This procedure inserts an implant to push the lobes of the prostate away from the urethra. Follow these instructions at home: Take over-the-counter and prescription medicines only as told by your health care provider. Monitor your symptoms for any changes. Contact your health care provider with any changes. Avoid drinking large amounts of liquid before going to bed or out in public. Avoid or reduce how much caffeine or alcohol you drink. Give yourself time when you urinate. Keep all follow-up visits as told by your health care provider. This is important. Contact a health care provider if: You have unexplained back pain. Your symptoms do not get better with treatment. You develop side effects from the medicine you are taking. Your urine becomes very dark or has a bad smell. Your lower abdomen becomes distended and you have trouble passing your urine. Get help right away if: You have a fever or chills. You suddenly cannot urinate. You feel lightheaded, or very dizzy, or you faint. There are large amounts of blood or clots in the urine. Your urinary problems become hard to manage. You develop moderate to severe low back or flank pain. The flank is the side of your body between the ribs and the hip. These symptoms may represent a serious problem that is an emergency. Do not wait to see if the symptoms will go away. Get medical help right away. Call your local emergency services (911 in the [place] Do not drive yourself to the hospital. Summary Benign prostatic hyperplasia (BPH) is an enlarged prostate that is caused by the normal aging process and not by cancer. An enlarged prostate can press on the urethra. This can make it hard to pass urine. This  condition is part of a normal aging process and is more likely to develop in men over the age of 50 years. Get help right away if you suddenly cannot urinate. This information is not intended to replace advice given to you by your health care provider. Make sure you discuss any questions you have with your healthcare provider. Document Revised: 01/13/2020 Document Reviewed: 01/13/2020 [employer] Patient Education  2022 [employer]  

## 2020-11-28 NOTE — Patient Instructions (Signed)
Eddie ROSOL Sr.  11/28/2020     @PREFPERIOPPHARMACY @   Your procedure is scheduled on  12/04/2020.   Report to [facility] at  [phone]  A.M.   Call this number if you have problems the morning of surgery:  [phone]   Remember:  Do not eat or drink after midnight.  DO NOT take any medication for diabetes the morning of your procedure.     Take these medicines the morning of surgery with A SIP OF WATER            uroxatrol, xanax (if needed), amlodipine, nexium, proscar, gabapentin, hydroxyzine, metoprolol.     Do not wear jewelry, make-up or nail polish.  Do not wear lotions, powders, or perfumes, or deodorant.  Do not shave 48 hours prior to surgery.  Men may shave face and neck.  Do not bring valuables to the hospital.  [facility] is not responsible for any belongings or valuables.  Contacts, dentures or bridgework may not be worn into surgery.  Leave your suitcase in the car.  After surgery it may be brought to your room.  For patients admitted to the hospital, discharge time will be determined by your treatment team.  Patients discharged the day of surgery will not be allowed to drive home and must  have someone with them for 24 hours.    Special instructions:     DO NOT smoke tobacco or vape for 24 hours before your procedure.  Please read over the following fact sheets that you were given. Coughing and Deep Breathing, Surgical Site Infection Prevention, Anesthesia Post-op Instructions, and Care and Recovery After Surgery      Cystoscopy Cystoscopy is a procedure that is used to help diagnose and sometimes treat conditions that affect the lower urinary tract. The lower urinary tract includes the bladder and the urethra. The urethra is the tube that drains urine from the bladder. Cystoscopy is done using a thin, tube-shaped instrument with a light and camera at the end (cystoscope). The cystoscope may be hard or flexible, depending on the goal of  theprocedure. The cystoscope is inserted through the urethra, into the bladder. Cystoscopy may be recommended if you have: Urinary tract infections that keep coming back. Blood in the urine (hematuria). An inability to control when you urinate (urinary incontinence) or an overactive bladder. Unusual cells found in a urine sample. A blockage in the urethra, such as a urinary stone. Painful urination. An abnormality in the bladder found during an intravenous pyelogram (IVP) or CT scan. Cystoscopy may also be done to remove a sample of tissue to be examined under a microscope (biopsy). Tell a health care provider about: Any allergies you have. All medicines you are taking, including vitamins, herbs, eye drops, creams, and over-the-counter medicines. Any problems you or family members have had with anesthetic medicines. Any blood disorders you have. Any surgeries you have had. Any medical conditions you have. Whether you are pregnant or may be pregnant. What are the risks? Generally, this is a safe procedure. However, problems may occur, including: Infection. Bleeding. Allergic reactions to medicines. Damage to other structures or organs. What happens before the procedure? Medicines Ask your health care provider about: Changing or stopping your regular medicines. This is especially important if you are taking diabetes medicines or blood thinners. Taking medicines such as aspirin and ibuprofen. These medicines can thin your blood. Do not take these medicines unless your health care provider  tells you to take them. Taking over-the-counter medicines, vitamins, herbs, and supplements. Tests You may have an exam or testing, such as: X-rays of the bladder, urethra, or kidneys. CT scan of the abdomen or pelvis. Urine tests to check for signs of infection. General instructions Follow instructions from your health care provider about eating or drinking restrictions. Ask your health care  provider what steps will be taken to help prevent infection. These steps may include: Washing skin with a germ-killing soap. Taking antibiotic medicine. Plan to have a responsible adult take you home from the hospital or clinic. What happens during the procedure?  You will be given one or more of the following: A medicine to help you relax (sedative). A medicine to numb the area (local anesthetic). The area around the opening of your urethra will be cleaned. The cystoscope will be passed through your urethra into your bladder. Germ-free (sterile) fluid will flow through the cystoscope to fill your bladder. The fluid will stretch your bladder so that your health care provider can clearly examine your bladder walls. Your doctor will look at the urethra and bladder. Your doctor may take a biopsy or remove stones. The cystoscope will be removed, and your bladder will be emptied. The procedure may vary among health care providers and hospitals. What can I expect after the procedure? After the procedure, it is common to have: Some soreness or pain in your abdomen and urethra. Urinary symptoms. These include: Mild pain or burning when you urinate. Pain should stop within a few minutes after you urinate. This may last for up to 1 week. A small amount of blood in your urine for several days. Feeling like you need to urinate but producing only a small amount of urine. Follow these instructions at home: Medicines Take over-the-counter and prescription medicines only as told by your health care provider. If you were prescribed an antibiotic medicine, take it as told by your health care provider. Do not stop taking the antibiotic even if you start to feel better. General instructions Return to your normal activities as told by your health care provider. Ask your health care provider what activities are safe for you. If you were given a sedative during the procedure, it can affect you for several  hours. Do not drive or operate machinery until your health care provider says that it is safe. Watch for any blood in your urine. If the amount of blood in your urine increases, call your health care provider. Follow instructions from your health care provider about eating or drinking restrictions. If a tissue sample was removed for testing (biopsy) during your procedure, it is up to you to get your test results. Ask your health care provider, or the department that is doing the test, when your results will be ready. Drink enough fluid to keep your urine pale yellow. Keep all follow-up visits. This is important. Contact a health care provider if: You have pain that gets worse or does not get better with medicine, especially pain when you urinate. You have trouble urinating. You have more blood in your urine. Get help right away if: You have blood clots in your urine. You have abdominal pain. You have a fever or chills. You are unable to urinate. Summary Cystoscopy is a procedure that is used to help diagnose and sometimes treat conditions that affect the lower urinary tract. Cystoscopy is done using a thin, tube-shaped instrument with a light and camera at the end. After the procedure, it is  common to have some soreness or pain in your abdomen and urethra. Watch for any blood in your urine. If the amount of blood in your urine increases, call your health care provider. If you were prescribed an antibiotic medicine, take it as told by your health care provider. Do not stop taking the antibiotic even if you start to feel better. This information is not intended to replace advice given to you by your health care provider. Make sure you discuss any questions you have with your healthcare provider. Document Revised: 12/17/2019 Document Reviewed: 12/17/2019 Elsevier Patient Education  [place] Anesthesia, Adult, Care After This sheet gives you information about how to care for  yourself after your procedure. Your health care provider may also give you more specific instructions. If you have problems or questions, contact your health careprovider. What can I expect after the procedure? After the procedure, the following side effects are common: Pain or discomfort at the IV site. Nausea. Vomiting. Sore throat. Trouble concentrating. Feeling cold or chills. Feeling weak or tired. Sleepiness and fatigue. Soreness and body aches. These side effects can affect parts of the body that were not involved in surgery. Follow these instructions at home: For the time period you were told by your health care provider:  Rest. Do not participate in activities where you could fall or become injured. Do not drive or use machinery. Do not drink alcohol. Do not take sleeping pills or medicines that cause drowsiness. Do not make important decisions or sign legal documents. Do not take care of children on your own.  Eating and drinking Follow any instructions from your health care provider about eating or drinking restrictions. When you feel hungry, start by eating small amounts of foods that are soft and easy to digest (bland), such as toast. Gradually return to your regular diet. Drink enough fluid to keep your urine pale yellow. If you vomit, rehydrate by drinking water, juice, or clear broth. General instructions If you have sleep apnea, surgery and certain medicines can increase your risk for breathing problems. Follow instructions from your health care provider about wearing your sleep device: Anytime you are sleeping, including during daytime naps. While taking prescription pain medicines, sleeping medicines, or medicines that make you drowsy. Have a responsible adult stay with you for the time you are told. It is important to have someone help care for you until you are awake and alert. Return to your normal activities as told by your health care provider. Ask your health  care provider what activities are safe for you. Take over-the-counter and prescription medicines only as told by your health care provider. If you smoke, do not smoke without supervision. Keep all follow-up visits as told by your health care provider. This is important. Contact a health care provider if: You have nausea or vomiting that does not get better with medicine. You cannot eat or drink without vomiting. You have pain that does not get better with medicine. You are unable to pass urine. You develop a skin rash. You have a fever. You have redness around your IV site that gets worse. Get help right away if: You have difficulty breathing. You have chest pain. You have blood in your urine or stool, or you vomit blood. Summary After the procedure, it is common to have a sore throat or nausea. It is also common to feel tired. Have a responsible adult stay with you for the time you are told. It is important to have  someone help care for you until you are awake and alert. When you feel hungry, start by eating small amounts of foods that are soft and easy to digest (bland), such as toast. Gradually return to your regular diet. Drink enough fluid to keep your urine pale yellow. Return to your normal activities as told by your health care provider. Ask your health care provider what activities are safe for you. This information is not intended to replace advice given to you by your health care provider. Make sure you discuss any questions you have with your healthcare provider. Document Revised: 01/20/2020 Document Reviewed: 08/19/2019 Elsevier Patient Education  2022 [place]. How to Use Chlorhexidine for Bathing Chlorhexidine gluconate (CHG) is a germ-killing (antiseptic) solution that is used to clean the skin. It can get rid of the bacteria that normally live on the skin and can keep them away for about 24 hours. To clean your skin with CHG, you may be given: A CHG solution to use in  the shower or as part of a sponge bath. A prepackaged cloth that contains CHG. Cleaning your skin with CHG may help lower the risk for infection: While you are staying in the intensive care unit of the hospital. If you have a vascular access, such as a central line, to provide short-term or long-term access to your veins. If you have a catheter to drain urine from your bladder. If you are on a ventilator. A ventilator is a machine that helps you breathe by moving air in and out of your lungs. After surgery. What are the risks? Risks of using CHG include: A skin reaction. Hearing loss, if CHG gets in your ears. Eye injury, if CHG gets in your eyes and is not rinsed out. The CHG product catching fire. Make sure that you avoid smoking and flames after applying CHG to your skin. Do not use CHG: If you have a chlorhexidine allergy or have previously reacted to chlorhexidine. On babies younger than 26 months of age. How to use CHG solution Use CHG only as told by your health care provider, and follow the instructions on the label. Use the full amount of CHG as directed. Usually, this is one bottle. During a shower Follow these steps when using CHG solution during a shower (unless your health care provider gives you different instructions): Start the shower. Use your normal soap and shampoo to wash your face and hair. Turn off the shower or move out of the shower stream. Pour the CHG onto a clean washcloth. Do not use any type of brush or rough-edged sponge. Starting at your neck, lather your body down to your toes. Make sure you follow these instructions: If you will be having surgery, pay special attention to the part of your body where you will be having surgery. Scrub this area for at least 1 minute. Do not use CHG on your head or face. If the solution gets into your ears or eyes, rinse them well with water. Avoid your genital area. Avoid any areas of skin that have broken skin, cuts, or  scrapes. Scrub your back and under your arms. Make sure to wash skin folds. Let the lather sit on your skin for 1-2 minutes or as long as told by your health care provider. Thoroughly rinse your entire body in the shower. Make sure that all body creases and crevices are rinsed well. Dry off with a clean towel. Do not put any substances on your body afterward--such as powder, lotion,  or perfume--unless you are told to do so by your health care provider. Only use lotions that are recommended by the manufacturer. Put on clean clothes or pajamas. If it is the night before your surgery, sleep in clean sheets.  During a sponge bath Follow these steps when using CHG solution during a sponge bath (unless your health care provider gives you different instructions): Use your normal soap and shampoo to wash your face and hair. Pour the CHG onto a clean washcloth. Starting at your neck, lather your body down to your toes. Make sure you follow these instructions: If you will be having surgery, pay special attention to the part of your body where you will be having surgery. Scrub this area for at least 1 minute. Do not use CHG on your head or face. If the solution gets into your ears or eyes, rinse them well with water. Avoid your genital area. Avoid any areas of skin that have broken skin, cuts, or scrapes. Scrub your back and under your arms. Make sure to wash skin folds. Let the lather sit on your skin for 1-2 minutes or as long as told by your health care provider. Using a different clean, wet washcloth, thoroughly rinse your entire body. Make sure that all body creases and crevices are rinsed well. Dry off with a clean towel. Do not put any substances on your body afterward--such as powder, lotion, or perfume--unless you are told to do so by your health care provider. Only use lotions that are recommended by the manufacturer. Put on clean clothes or pajamas. If it is the night before your surgery, sleep  in clean sheets. How to use CHG prepackaged cloths Only use CHG cloths as told by your health care provider, and follow the instructions on the label. Use the CHG cloth on clean, dry skin. Do not use the CHG cloth on your head or face unless your health care provider tells you to. When washing with the CHG cloth: Avoid your genital area. Avoid any areas of skin that have broken skin, cuts, or scrapes. Before surgery Follow these steps when using a CHG cloth to clean before surgery (unless your health care provider gives you different instructions): Using the CHG cloth, vigorously scrub the part of your body where you will be having surgery. Scrub using a back-and-forth motion for 3 minutes. The area on your body should be completely wet with CHG when you are done scrubbing. Do not rinse. Discard the cloth and let the area air-dry. Do not put any substances on the area afterward, such as powder, lotion, or perfume. Put on clean clothes or pajamas. If it is the night before your surgery, sleep in clean sheets.  For general bathing Follow these steps when using CHG cloths for general bathing (unless your health care provider gives you different instructions). Use a separate CHG cloth for each area of your body. Make sure you wash between any folds of skin and between your fingers and toes. Wash your body in the following order, switching to a new cloth after each step: The front of your neck, shoulders, and chest. Both of your arms, under your arms, and your hands. Your stomach and groin area, avoiding the genitals. Your right leg and foot. Your left leg and foot. The back of your neck, your back, and your buttocks. Do not rinse. Discard the cloth and let the area air-dry. Do not put any substances on your body afterward--such as powder, lotion, or perfume--unless you  are told to do so by your health care provider. Only use lotions that are recommended by the manufacturer. Put on clean clothes  or pajamas. Contact a health care provider if: Your skin gets irritated after scrubbing. You have questions about using your solution or cloth. Get help right away if: Your eyes become very red or swollen. Your eyes itch badly. Your skin itches badly and is red or swollen. Your hearing changes. You have trouble seeing. You have swelling or tingling in your mouth or throat. You have trouble breathing. You swallow any chlorhexidine. Summary Chlorhexidine gluconate (CHG) is a germ-killing (antiseptic) solution that is used to clean the skin. Cleaning your skin with CHG may help to lower your risk for infection. You may be given CHG to use for bathing. It may be in a bottle or in a prepackaged cloth to use on your skin. Carefully follow your health care provider's instructions and the instructions on the product label. Do not use CHG if you have a chlorhexidine allergy. Contact your health care provider if your skin gets irritated after scrubbing. This information is not intended to replace advice given to you by your health care provider. Make sure you discuss any questions you have with your healthcare provider. Document Revised: 09/17/2019 Document Reviewed: 10/22/2019 Elsevier Patient Education  [place].

## 2020-11-29 ENCOUNTER — Encounter (HOSPITAL_COMMUNITY)
Admission: RE | Admit: 2020-11-29 | Discharge: 2020-11-29 | Disposition: A | Discharge status: Home / Self Care | Payer: Medicare Other | Source: Ambulatory Visit | Attending: Urology | Admitting: Urology

## 2020-11-29 ENCOUNTER — Encounter (HOSPITAL_COMMUNITY): Payer: Self-pay

## 2020-11-29 ENCOUNTER — Other Ambulatory Visit: Payer: Self-pay

## 2020-11-29 VITALS — BP 108/75 | HR 74 | Temp 97.6°F | Resp 18 | Ht 70.0 in | Wt 182.0 lb

## 2020-11-29 DIAGNOSIS — E119 Type 2 diabetes mellitus without complications: Secondary | ICD-10-CM | POA: Insufficient documentation

## 2020-11-29 DIAGNOSIS — Z01818 Encounter for other preprocedural examination: Secondary | ICD-10-CM | POA: Diagnosis present

## 2020-11-29 HISTORY — DX: Anxiety disorder, unspecified: F41.9

## 2020-11-29 HISTORY — DX: Gastro-esophageal reflux disease without esophagitis: K21.9

## 2020-11-29 HISTORY — DX: Blindness, one eye, unspecified eye: H54.40

## 2020-11-29 LAB — BASIC METABOLIC PANEL
Anion gap: 9 (ref 5–15)
BUN: 21 mg/dL (ref 8–23)
CO2: 24 mmol/L (ref 22–32)
Calcium: 8.9 mg/dL (ref 8.9–10.3)
Chloride: 103 mmol/L (ref 98–111)
Creatinine, Ser: 1.08 mg/dL (ref 0.61–1.24)
GFR, Estimated: >60 mL/min (ref >60)
Glucose, Bld: 128 mg/dL — ABNORMAL HIGH (ref 70–99)
Potassium: 4.3 mmol/L (ref 3.5–5.1)
Sodium: 136 mmol/L (ref 135–145)

## 2020-11-29 LAB — HEMOGLOBIN A1C
Hgb A1c MFr Bld: 6.7 % — ABNORMAL HIGH (ref 4.8–5.6)
Mean Plasma Glucose: 145.59 mg/dL

## 2020-11-30 NOTE — Pre-Procedure Instructions (Signed)
Hgba1c routed to PCP. 

## 2020-12-04 ENCOUNTER — Ambulatory Visit (HOSPITAL_COMMUNITY): Payer: Medicare Other | Admitting: Anesthesiology

## 2020-12-04 ENCOUNTER — Ambulatory Visit (HOSPITAL_COMMUNITY)
Admission: RE | Admit: 2020-12-04 | Discharge: 2020-12-04 | Disposition: A | Discharge status: Home / Self Care | Payer: Medicare Other | Attending: Urology | Admitting: Urology

## 2020-12-04 ENCOUNTER — Encounter (HOSPITAL_COMMUNITY)
Admission: RE | Disposition: A | Discharge status: Home / Self Care | Payer: Self-pay | Source: Home / Self Care | Attending: Urology

## 2020-12-04 ENCOUNTER — Encounter (HOSPITAL_COMMUNITY): Payer: Self-pay | Admitting: Urology

## 2020-12-04 DIAGNOSIS — Z8673 Personal history of transient ischemic attack (TIA), and cerebral infarction without residual deficits: Secondary | ICD-10-CM | POA: Diagnosis not present

## 2020-12-04 DIAGNOSIS — Z8249 Family history of ischemic heart disease and other diseases of the circulatory system: Secondary | ICD-10-CM | POA: Diagnosis not present

## 2020-12-04 DIAGNOSIS — Z881 Allergy status to other antibiotic agents status: Secondary | ICD-10-CM | POA: Insufficient documentation

## 2020-12-04 DIAGNOSIS — Z833 Family history of diabetes mellitus: Secondary | ICD-10-CM | POA: Insufficient documentation

## 2020-12-04 DIAGNOSIS — E1151 Type 2 diabetes mellitus with diabetic peripheral angiopathy without gangrene: Secondary | ICD-10-CM | POA: Insufficient documentation

## 2020-12-04 DIAGNOSIS — Z951 Presence of aortocoronary bypass graft: Secondary | ICD-10-CM | POA: Diagnosis not present

## 2020-12-04 DIAGNOSIS — R338 Other retention of urine: Secondary | ICD-10-CM | POA: Insufficient documentation

## 2020-12-04 DIAGNOSIS — H5461 Unqualified visual loss, right eye, normal vision left eye: Secondary | ICD-10-CM | POA: Insufficient documentation

## 2020-12-04 DIAGNOSIS — F17210 Nicotine dependence, cigarettes, uncomplicated: Secondary | ICD-10-CM | POA: Insufficient documentation

## 2020-12-04 DIAGNOSIS — E1142 Type 2 diabetes mellitus with diabetic polyneuropathy: Secondary | ICD-10-CM | POA: Insufficient documentation

## 2020-12-04 DIAGNOSIS — E785 Hyperlipidemia, unspecified: Secondary | ICD-10-CM | POA: Diagnosis not present

## 2020-12-04 DIAGNOSIS — N401 Enlarged prostate with lower urinary tract symptoms: Secondary | ICD-10-CM | POA: Insufficient documentation

## 2020-12-04 DIAGNOSIS — I10 Essential (primary) hypertension: Secondary | ICD-10-CM | POA: Diagnosis not present

## 2020-12-04 DIAGNOSIS — Z888 Allergy status to other drugs, medicaments and biological substances status: Secondary | ICD-10-CM | POA: Diagnosis not present

## 2020-12-04 DIAGNOSIS — N138 Other obstructive and reflux uropathy: Secondary | ICD-10-CM

## 2020-12-04 HISTORY — PX: CYSTOSCOPY WITH INSERTION OF UROLIFT: SHX6678

## 2020-12-04 LAB — GLUCOSE, CAPILLARY: Glucose-Capillary: 143 mg/dL — ABNORMAL HIGH (ref 70–99)

## 2020-12-04 SURGERY — CYSTOSCOPY WITH INSERTION OF UROLIFT
Anesthesia: General

## 2020-12-04 MED ORDER — PROPOFOL 10 MG/ML IV BOLUS
INTRAVENOUS | Status: AC
  Filled 2020-12-04: qty 20

## 2020-12-04 MED ORDER — LIDOCAINE HCL (PF) 2 % IJ SOLN
INTRAMUSCULAR | Status: AC
  Filled 2020-12-04: qty 10

## 2020-12-04 MED ORDER — LIDOCAINE HCL URETHRAL/MUCOSAL 2 % EX GEL
CUTANEOUS | Status: DC | PRN
  Administered 2020-12-04: 1

## 2020-12-04 MED ORDER — PROPOFOL 10 MG/ML IV BOLUS
INTRAVENOUS | Status: DC | PRN
Start: 2020-12-04 — End: 2020-12-04
  Administered 2020-12-04: 175 mg via INTRAVENOUS

## 2020-12-04 MED ORDER — FENTANYL CITRATE (PF) 100 MCG/2ML IJ SOLN
INTRAMUSCULAR | Status: DC | PRN
  Administered 2020-12-04 (×2): 50 ug via INTRAVENOUS

## 2020-12-04 MED ORDER — METOPROLOL TARTRATE 5 MG/5ML IV SOLN
INTRAVENOUS | Status: AC
  Filled 2020-12-04: qty 5

## 2020-12-04 MED ORDER — LACTATED RINGERS IV SOLN: INTRAVENOUS | Status: DC

## 2020-12-04 MED ORDER — FENTANYL CITRATE (PF) 100 MCG/2ML IJ SOLN
INTRAMUSCULAR | Status: AC
  Filled 2020-12-04: qty 2

## 2020-12-04 MED ORDER — LIDOCAINE HCL (CARDIAC) PF 50 MG/5ML IV SOSY
PREFILLED_SYRINGE | INTRAVENOUS | Status: DC | PRN
  Administered 2020-12-04: 75 mg via INTRAVENOUS

## 2020-12-04 MED ORDER — PROPOFOL 10 MG/ML IV BOLUS
INTRAVENOUS | Status: AC
  Filled 2020-12-04: qty 40

## 2020-12-04 MED ORDER — WATER FOR IRRIGATION, STERILE IR SOLN
Status: DC | PRN
  Administered 2020-12-04: 3000 mL

## 2020-12-04 MED ORDER — ONDANSETRON HCL 4 MG/2ML IJ SOLN: 4.0000 mg | Freq: Once | INTRAMUSCULAR | Status: DC | PRN

## 2020-12-04 MED ORDER — LIDOCAINE HCL URETHRAL/MUCOSAL 2 % EX GEL
CUTANEOUS | Status: AC
  Filled 2020-12-04: qty 10

## 2020-12-04 MED ORDER — EPHEDRINE 5 MG/ML INJ
INTRAVENOUS | Status: AC
  Filled 2020-12-04: qty 5

## 2020-12-04 MED ORDER — CEFAZOLIN SODIUM-DEXTROSE 2-4 GM/100ML-% IV SOLN
INTRAVENOUS | Status: AC
  Filled 2020-12-04: qty 100

## 2020-12-04 MED ORDER — METHYLPREDNISOLONE SODIUM SUCC 125 MG IJ SOLR
INTRAMUSCULAR | Status: AC
  Filled 2020-12-04: qty 2

## 2020-12-04 MED ORDER — EPHEDRINE SULFATE 50 MG/ML IJ SOLN
INTRAMUSCULAR | Status: DC | PRN
  Administered 2020-12-04: 10 mg via INTRAVENOUS

## 2020-12-04 MED ORDER — CHLORHEXIDINE GLUCONATE 0.12 % MT SOLN
OROMUCOSAL | Status: AC
  Administered 2020-12-04: 15 mL
  Filled 2020-12-04: qty 15

## 2020-12-04 MED ORDER — FENTANYL CITRATE (PF) 100 MCG/2ML IJ SOLN
25.0000 ug | INTRAMUSCULAR | Status: DC | PRN
  Administered 2020-12-04: 50 ug via INTRAVENOUS

## 2020-12-04 MED ORDER — CEFAZOLIN SODIUM-DEXTROSE 2-4 GM/100ML-% IV SOLN
2.0000 g | INTRAVENOUS | Status: AC
  Administered 2020-12-04: 2 g via INTRAVENOUS

## 2020-12-04 MED ORDER — TRAMADOL HCL 50 MG PO TABS: 50.0000 mg | ORAL_TABLET | Freq: Four times a day (QID) | ORAL | 0 refills | Status: DC | PRN

## 2020-12-04 SURGICAL SUPPLY — 18 items
BAG DRAIN URO TABLE W/ADPT NS (BAG) ×2 IMPLANT
BAG DRN 8 ADPR NS SKTRN CSTL (BAG) ×1
BAG HAMPER (MISCELLANEOUS) ×2 IMPLANT
CLOTH BEACON ORANGE TIMEOUT ST (SAFETY) ×2 IMPLANT
GLOVE BIO SURGEON STRL SZ8 (GLOVE) ×2 IMPLANT
GLOVE SURG UNDER POLY LF SZ7 (GLOVE) ×4 IMPLANT
GOWN STRL REUS W/TWL LRG LVL3 (GOWN DISPOSABLE) ×2 IMPLANT
GOWN STRL REUS W/TWL XL LVL3 (GOWN DISPOSABLE) ×2 IMPLANT
KIT TURNOVER CYSTO (KITS) ×2 IMPLANT
MANIFOLD NEPTUNE II (INSTRUMENTS) ×2 IMPLANT
PACK CYSTO (CUSTOM PROCEDURE TRAY) ×2 IMPLANT
PAD ARMBOARD 7.5X6 YLW CONV (MISCELLANEOUS) ×2 IMPLANT
SYSTEM UROLIFT (Male Continence) ×12 IMPLANT
TOWEL OR 17X26 4PK STRL BLUE (TOWEL DISPOSABLE) ×2 IMPLANT
TRAY FOLEY W/BAG SLVR 16FR (SET/KITS/TRAYS/PACK) ×2
TRAY FOLEY W/BAG SLVR 16FR ST (SET/KITS/TRAYS/PACK) ×1 IMPLANT
WATER STERILE IRR 3000ML UROMA (IV SOLUTION) ×2 IMPLANT
WATER STERILE IRR 500ML POUR (IV SOLUTION) ×2 IMPLANT

## 2020-12-04 NOTE — Transfer of Care (Signed)
Immediate Anesthesia Transfer of Care Note  Patient: Eddie CHUBA Sr.  Procedure(s) Performed: CYSTOSCOPY WITH INSERTION OF UROLIFT  Patient Location: PACU  Anesthesia Type:General  Level of Consciousness: awake  Airway & Oxygen Therapy: Patient Spontanous Breathing  Post-op Assessment: Report given to RN  Post vital signs: Reviewed and stable  Last Vitals:  Vitals Value Taken Time  BP 142/90 12/04/20 0824  Temp    Pulse 81 12/04/20 0826  Resp 15 12/04/20 0826  SpO2 96 % 12/04/20 0826  Vitals shown include unvalidated device data.  Last Pain:  Vitals:   12/04/20 0716  PainSc: 0-No pain         Complications: No notable events documented.

## 2020-12-04 NOTE — H&P (Signed)
Urology Admission H&P  Chief Complaint: Urinary retention  History of Present Illness: Mr Eddie Reed is a 70yo with a hx of BPH here for Urolift. He has severe LUTS on medical therapy. No other complaints.   Past Medical History:  Diagnosis Date   Allergy history, drug    plavix   Anxiety    Arm pain    continued discomfort in his arm- seeing Dr. Annie Main   Blind right eye    CAD (coronary artery disease)    catheterizzation. 11/10/07. patent LIMA to the LAD with competitive filling, patent SVG to diagonal that enters a bifurcation point, occluded SVG to the distal marginal w/some collateralization, patent SVG to distal posterior lateral. medical therapy recommended.  EF 55% echo. June,2009. hypokinesis of the mid and distal septal wall. technically difficult study.   Carotid artery disease ([place])    Doppler, July, 2012, 0-39% bilateral, followup 2 years   Cellulitis    s/p right lower extremity cellulitis   Claudication ([place])    Arterial Dopplers, July, 2012, normal with normal ABI   Diabetes mellitus    type not specified   Drug intolerance    Patient is intolerant to both aspirin and Plavix.   Dyslipidemia    ED (erectile dysfunction)    Ejection fraction    EF 55%, echo, June, 2009, hypokinesis mid/distal septal wall, technically difficult study   Fibromyalgia    GERD (gastroesophageal reflux disease)    Glaucoma    right eye   H/O alcohol abuse    Alcohol abuse in the past   Hypertension    Polyneuropathy in diabetes(357.2) 10/06/2013   Psoriatic arthritis ([place])    Ringing in ear    June, 2012 /  carotid Dopplers, in July, 2012, 0-39% bilateral   S/P CABG x 1 09/18/2007   2009   Stroke due to embolism of cerebral artery ([place]) 03/08/2015   Right MCA   Tobacco abuse    Past Surgical History:  Procedure Laterality Date   CATARACT EXTRACTION Right    CORONARY ARTERY BYPASS GRAFT      Home Medications:  Current Facility-Administered Medications  Medication Dose Route  Frequency Provider Last Rate Last Admin   ceFAZolin (ANCEF) 2-4 GM/100ML-% IVPB            ceFAZolin (ANCEF) IVPB 2g/100 mL premix  2 g Intravenous 30 min Pre-Op Suriya Kovarik, Candee Furbish, MD       lactated ringers infusion   Intravenous Continuous Denese Killings, MD 50 mL/hr at 12/04/20 0733 New Bag at 12/04/20 0733   Allergies:  Allergies  Allergen Reactions   Clopidogrel Hives   Levofloxacin Hives   Cymbalta [Duloxetine Hcl]     Headache, shortness of breath   Paroxetine Hives   Terbinafine Hcl Hives    Family History  Problem Relation Age of Onset   Dementia Mother    Diabetes Mother    Coronary artery disease Father    Heart attack Father    Cancer Brother    Coronary artery disease Other        family hx   Social History:  reports that he has been smoking cigarettes. He started smoking about 59 years ago. He has a 22.50 pack-year smoking history. He has never used smokeless tobacco. He reports current alcohol use. He reports that he does not use drugs.  Review of Systems  All other systems reviewed and are negative.  Physical Exam:  Vital signs in last 24 hours: Temp:  [97.9  F (36.6 C)] 97.9 F (36.6 C) (07/18 0716) Pulse Rate:  [70-74] 70 (07/18 0730) Resp:  [15-17] 15 (07/18 0730) BP: (119)/(68) 119/68 (07/18 0716) SpO2:  [93 %-94 %] 94 % (07/18 0730) Physical Exam Vitals reviewed.  Constitutional:      Appearance: Normal appearance.  HENT:     Head: Normocephalic and atraumatic.     Nose: Nose normal.     Mouth/Throat:     Mouth: Mucous membranes are dry.  Eyes:     Extraocular Movements: Extraocular movements intact.     Pupils: Pupils are equal, round, and reactive to light.  Cardiovascular:     Rate and Rhythm: Normal rate and regular rhythm.  Pulmonary:     Effort: Pulmonary effort is normal. No respiratory distress.  Abdominal:     General: Abdomen is flat. There is no distension.  Musculoskeletal:        General: No swelling. Normal range of  motion.     Cervical back: Normal range of motion and neck supple.  Skin:    General: Skin is warm and dry.  Neurological:     General: No focal deficit present.     Mental Status: He is alert and oriented to person, place, and time.  Psychiatric:        Mood and Affect: Mood normal.        Behavior: Behavior normal.        Thought Content: Thought content normal.        Judgment: Judgment normal.    Laboratory Data:  Results for orders placed or performed during the hospital encounter of 12/04/20 (from the past 24 hour(s))  Glucose, capillary     Status: Abnormal   Collection Time: 12/04/20  6:54 AM  Result Value Ref Range   Glucose-Capillary 143 (H) 70 - 99 mg/dL   No results found for this or any previous visit (from the past 240 hour(s)). Creatinine: Recent Labs    11/29/20 1458  CREATININE 1.08   Baseline Creatinine: 1  Impression/Assessment:  70yo with BPH with LUTS, urinary retention  Plan:  We discussed the management of his BPH including continued medical therapy, Rezum, Urolift, TURP and simple prostatectomy. After discussing the options the patient has elected to proceed with Urolift. Risks/benefits/alternatives discussed.   Nicolette Bang 12/04/2020, 7:38 AM

## 2020-12-04 NOTE — Anesthesia Postprocedure Evaluation (Signed)
Anesthesia Post Note  Patient: Eddie BURGHER Sr.  Procedure(s) Performed: CYSTOSCOPY WITH INSERTION OF UROLIFT  Anesthesia Type: General Level of consciousness: awake and alert Pain management: pain level controlled Vital Signs Assessment: post-procedure vital signs reviewed and stable Respiratory status: spontaneous breathing Cardiovascular status: blood pressure returned to baseline and stable Postop Assessment: no apparent nausea or vomiting Anesthetic complications: no   No notable events documented.   Last Vitals:  Vitals:   12/04/20 0716 12/04/20 0730  BP: 119/68   Pulse: 74 70  Resp: 17 15  Temp: 36.6 C   SpO2: 93% 94%    Last Pain:  Vitals:   12/04/20 0716  PainSc: 0-No pain                 Donelle Hise

## 2020-12-04 NOTE — Op Note (Signed)
   PREOPERATIVE DIAGNOSIS:  Benign prostatic hypertrophy with bladder outlet obstruction.  POSTOPERATIVE DIAGNOSIS:  Benign prostatic hypertrophy with bladder outlet obstruction.  PROCEDURE:  Cystoscopy with implantation of UroLift devices, 6 implants.  SURGEON:  Nicolette Bang, M.D.  ANESTHESIA:  General  ANTIBIOTICS: ancef  SPECIMEN:  None.  DRAINS:  A 16-French Foley catheter.  BLOOD LOSS:  Minimal.  COMPLICATIONS:  None.  INDICATIONS: The Patient is an 70 year old white male with BPH and bladder outlet obstruction.  He has failed medical therapy and has elected UroLift for definitive treatment.  FINDINGS OF PROCEDURE:  He was taken to the operating room where a genral anesthetic was induced.  He was placed in lithotomy position and was fitted with PAS hose.  His perineum and genitalia were prepped with chlorhexidine, and he was draped in usual sterile fashion.  Cystoscopy was performed using the UroLift scope and 0 degree lens. Examination revealed a normal urethra.  The external sphincter was intact.  Prostatic urethra was approximately 5 cm in length with lateral lobe enlargement. There was also little bit of bladder neck elevation. Inspection of bladder revealed mild-to-moderate trabeculation with no tumors, stones, or inflammation.  No cellules or diverticula were noted. Ureteral orifices were in their normal anatomic position effluxing clear urine.  After initial cystoscopy, the visual obturator was replaced with the first UroLift device.  This was turned to the 9 o'clock position and pulled back to the veru and then slightly advanced.  Pressure was then applied to the right lateral lobe and the UroLift device was deployed.  The second UroLift device was then inserted and applied to the left lateral lobe at 3 o'clock and deployed in the mid prostatic urethra. After this, there was still some apparent obstruction closer to the bladder neck.  So a second  and third level of UroLift your left device was applied between the mid urethra and the proximal urethra providing further patency to the prostatic urethra.  At this point, there was mild bleeding but the patient did have a spinal anesthetic.  So it was thought that a Foley catheter was indicated.  The scope was removed and a 16-French Foley catheter was inserted without difficulty. The balloon was filled with 10 mL sterile fluid, and the catheter was placed to straight drainage.  COMPLICATIONS: None   CONDITION: Stable, extubated, transferred to PACU  PLAN: The patient will be discharged home and followup in 2 days for a voiding trial.

## 2020-12-04 NOTE — Anesthesia Preprocedure Evaluation (Addendum)
Anesthesia Evaluation  Patient identified by MRN, date of birth, ID band Patient awake    Reviewed: Allergy & Precautions, NPO status , Patient's Chart, lab work & pertinent test results  History of Anesthesia Complications Negative for: history of anesthetic complications  Airway Mallampati: I  TM Distance: >3 FB Neck ROM: Full    Dental  (+) Dental Advisory Given, Missing, Loose,    Pulmonary shortness of breath and with exertion, Current Smoker and Patient abstained from smoking.,    Pulmonary exam normal breath sounds clear to auscultation       Cardiovascular hypertension, Pt. on medications + CAD, + Cardiac Stents and + CABG  Normal cardiovascular exam Rhythm:Regular Rate:Normal     Neuro/Psych Anxiety  Neuromuscular disease CVA    GI/Hepatic Neg liver ROS, GERD  Medicated,  Endo/Other  diabetes, Well Controlled, Type 2, Oral Hypoglycemic Agents  Renal/GU negative Renal ROS     Musculoskeletal  (+) Arthritis , Fibromyalgia -, narcotic dependent  Abdominal   Peds  Hematology   Anesthesia Other Findings Right eye blindness   Reproductive/Obstetrics                           Anesthesia Physical Anesthesia Plan  ASA: 3  Anesthesia Plan: General   Post-op Pain Management:    Induction: Intravenous  PONV Risk Score and Plan: 3 and Ondansetron  Airway Management Planned: LMA  Additional Equipment:   Intra-op Plan:   Post-operative Plan: Extubation in OR  Informed Consent: I have reviewed the patients History and Physical, chart, labs and discussed the procedure including the risks, benefits and alternatives for the proposed anesthesia with the patient or authorized representative who has indicated his/her understanding and acceptance.     Dental advisory given  Plan Discussed with: CRNA and Surgeon  Anesthesia Plan Comments:         Anesthesia Quick Evaluation

## 2020-12-04 NOTE — Anesthesia Procedure Notes (Signed)
Procedure Name: LMA Insertion Date/Time: 12/04/2020 7:56 AM Performed by: Ollen Bowl, CRNA Pre-anesthesia Checklist: Patient identified, Patient being monitored, Emergency Drugs available, Timeout performed and Suction available Patient Re-evaluated:Patient Re-evaluated prior to induction Oxygen Delivery Method: Circle System Utilized Preoxygenation: Pre-oxygenation with 100% oxygen Induction Type: IV induction Ventilation: Mask ventilation without difficulty LMA: LMA inserted LMA Size: 4.0 Number of attempts: 1 Placement Confirmation: positive ETCO2 and breath sounds checked- equal and bilateral

## 2020-12-05 ENCOUNTER — Encounter (HOSPITAL_COMMUNITY): Payer: Self-pay | Admitting: Urology

## 2020-12-06 ENCOUNTER — Ambulatory Visit (INDEPENDENT_AMBULATORY_CARE_PROVIDER_SITE_OTHER): Payer: Medicare Other | Admitting: Urology

## 2020-12-06 ENCOUNTER — Encounter: Payer: Self-pay | Admitting: Urology

## 2020-12-06 ENCOUNTER — Other Ambulatory Visit: Payer: Self-pay

## 2020-12-06 VITALS — BP 119/78 | HR 82 | Temp 98.4°F | Wt 184.2 lb

## 2020-12-06 DIAGNOSIS — N401 Enlarged prostate with lower urinary tract symptoms: Secondary | ICD-10-CM | POA: Diagnosis not present

## 2020-12-06 DIAGNOSIS — R339 Retention of urine, unspecified: Secondary | ICD-10-CM

## 2020-12-06 NOTE — Progress Notes (Signed)
Voiding trial completed by Estill Bamberg RN-  Fill and Pull Catheter Removal  Patient is present today for a catheter removal.  Patient was cleaned and prepped in a sterile fashion 145ml of sterile water/ saline was instilled into the bladder when the patient felt the urge to urinate. 139ml of water was then drained from the balloon.  A 16FR foley cath was removed from the bladder no complications were noted .  Patient as then given some time to void on their own.  Patient cannot void  27ml on their own after some time.  Patient tolerated well.  Performed by: Estill Bamberg RN  Follow up/ Additional notes: patient had bladder spasms during instillation. Water was not able to stay in for patient to void.

## 2020-12-13 ENCOUNTER — Ambulatory Visit (INDEPENDENT_AMBULATORY_CARE_PROVIDER_SITE_OTHER): Payer: Medicare Other | Admitting: Urology

## 2020-12-13 ENCOUNTER — Encounter: Payer: Self-pay | Admitting: Urology

## 2020-12-13 ENCOUNTER — Other Ambulatory Visit: Payer: Self-pay

## 2020-12-13 VITALS — BP 100/65 | HR 87

## 2020-12-13 DIAGNOSIS — R339 Retention of urine, unspecified: Secondary | ICD-10-CM | POA: Diagnosis not present

## 2020-12-13 DIAGNOSIS — N401 Enlarged prostate with lower urinary tract symptoms: Secondary | ICD-10-CM

## 2020-12-13 LAB — BLADDER SCAN AMB NON-IMAGING: Scan Result: 374

## 2020-12-13 NOTE — Progress Notes (Signed)
12/13/2020 2:21 PM   Eddie Shaggy Sr. July 20, 1950 [id number]  Referring provider: Practice, [place],  [place] [address]  Followup BPH   HPI: Eddie Reed is a 70yo here for followup for BPH. He underwent Urolift 1 week ago. Pvr 372cc. His LUTS have significantly improved since surgery. He has intermittent dysuria which is improving. Nocturia 0-1x. Urine stream strong. He has urgency but no urge incontinence. No other complaints today   PMH: Past Medical History:  Diagnosis Date   Allergy history, drug    plavix   Anxiety    Arm pain    continued discomfort in his arm- seeing Dr. Annie Main   Blind right eye    CAD (coronary artery disease)    catheterizzation. 11/10/07. patent LIMA to the LAD with competitive filling, patent SVG to diagonal that enters a bifurcation point, occluded SVG to the distal marginal w/some collateralization, patent SVG to distal posterior lateral. medical therapy recommended.  EF 55% echo. June,2009. hypokinesis of the mid and distal septal wall. technically difficult study.   Carotid artery disease ([place])    Doppler, July, 2012, 0-39% bilateral, followup 2 years   Cellulitis    s/p right lower extremity cellulitis   Claudication ([place])    Arterial Dopplers, July, 2012, normal with normal ABI   Diabetes mellitus    type not specified   Drug intolerance    Patient is intolerant to both aspirin and Plavix.   Dyslipidemia    ED (erectile dysfunction)    Ejection fraction    EF 55%, echo, June, 2009, hypokinesis mid/distal septal wall, technically difficult study   Fibromyalgia    GERD (gastroesophageal reflux disease)    Glaucoma    right eye   H/O alcohol abuse    Alcohol abuse in the past   Hypertension    Polyneuropathy in diabetes(357.2) 10/06/2013   Psoriatic arthritis ([place])    Ringing in ear    June, 2012 /  carotid Dopplers, in July, 2012, 0-39% bilateral   S/P CABG x 1 09/18/2007   2009   Stroke due to embolism of  cerebral artery ([place]) 03/08/2015   Right MCA   Tobacco abuse     Surgical History: Past Surgical History:  Procedure Laterality Date   CATARACT EXTRACTION Right    CORONARY ARTERY BYPASS GRAFT     CYSTOSCOPY WITH INSERTION OF UROLIFT N/A 12/04/2020   Procedure: CYSTOSCOPY WITH INSERTION OF UROLIFT;  Surgeon: Cleon Gustin, MD;  Location: [facility];  Service: Urology;  Laterality: N/A;    Home Medications:  Allergies as of 12/13/2020       Reactions   Clopidogrel Hives   Levofloxacin Hives   Cymbalta [duloxetine Hcl]    Headache, shortness of breath   Paroxetine Hives   Terbinafine Hcl Hives        Medication List        Accurate as of December 13, 2020  2:21 PM. If you have any questions, ask your nurse or doctor.          albuterol 108 (90 Base) MCG/ACT inhaler Commonly known as: VENTOLIN HFA Inhale 2 puffs into the lungs 4 (four) times daily as needed.   alfuzosin 10 MG 24 hr tablet Commonly known as: UROXATRAL Take 10 mg by mouth daily.   ALPRAZolam 1 MG tablet Commonly known as: XANAX Take 1 mg by mouth 3 (three) times daily as needed.   amLODipine 5 MG tablet Commonly known as: NORVASC  Take 5 mg by mouth daily.   aspirin 81 MG tablet Take 81 mg by mouth daily.   atorvastatin 20 MG tablet Commonly known as: LIPITOR Take 20 mg by mouth at bedtime.   benazepril 20 MG tablet Commonly known as: LOTENSIN Take 20 mg by mouth daily.   betamethasone dipropionate 0.05 % cream Apply 1 application topically daily.   clobetasol 0.05 % topical foam Commonly known as: OLUX Apply 1 application topically 2 (two) times daily.   diclofenac sodium 1 % Gel Commonly known as: VOLTAREN Apply 2 g topically 3 (three) times daily.   esomeprazole 40 MG capsule Commonly known as: NEXIUM Take 40 mg by mouth daily at 12 noon.   etanercept 50 MG/ML injection Commonly known as: ENBREL Inject 50 mg into the skin once a week.   finasteride 5 MG tablet Commonly  known as: PROSCAR Take 1 tablet by mouth daily.   Fish Oil 1000 MG Cpdr Take 1 capsule by mouth 2 (two) times daily.   fluticasone 50 MCG/ACT nasal spray Commonly known as: FLONASE Place 2 sprays into both nostrils daily.   gabapentin 800 MG tablet Commonly known as: NEURONTIN Take 800 mg by mouth 4 (four) times daily.   glipiZIDE 5 MG tablet Commonly known as: GLUCOTROL Take 5 mg by mouth 2 (two) times daily before a meal.   HYDROcodone-acetaminophen 10-325 MG tablet Commonly known as: NORCO Take 1 tablet by mouth 4 (four) times daily.   hydrOXYzine 25 MG tablet Commonly known as: ATARAX/VISTARIL Take 25 mg by mouth every 8 (eight) hours as needed.   ketoconazole 2 % cream Commonly known as: NIZORAL Apply topically 2 (two) times daily. to affected area(s)   KRILL OIL PO Take 1 capsule by mouth daily.   lidocaine 5 % ointment Commonly known as: XYLOCAINE APPLY TO AFFECTED AREA THREE TIMES DAILY AS NEEDED   metFORMIN 1000 MG tablet Commonly known as: GLUCOPHAGE Take 1,000 mg by mouth 2 (two) times daily.   metoprolol tartrate 25 MG tablet Commonly known as: LOPRESSOR Take 1 tablet (25 mg total) by mouth 2 (two) times daily.   multivitamin tablet Take 1 tablet by mouth daily.   nitroGLYCERIN 0.4 MG SL tablet Commonly known as: NITROSTAT Place 1 tablet (0.4 mg total) under the tongue every 5 (five) minutes x 3 doses as needed for chest pain (if no relief after 2nd dose, proceed to the ED for an evaluation or call 911).   QUEtiapine 50 MG tablet Commonly known as: SEROQUEL Take 50 mg by mouth at bedtime as needed. Takes if someone is home with him   silodosin 8 MG Caps capsule Commonly known as: RAPAFLO Take 1 capsule (8 mg total) by mouth daily with breakfast.   tamsulosin 0.4 MG Caps capsule Commonly known as: FLOMAX Take 0.4 mg by mouth daily after supper.   traMADol 50 MG tablet Commonly known as: Ultram Take 1 tablet (50 mg total) by mouth every 6  (six) hours as needed.   VIAGRA PO Take by mouth as needed.   Vitamin D3 50 MCG (2000 UT) capsule Take 2,000 Units by mouth daily.        Allergies:  Allergies  Allergen Reactions   Clopidogrel Hives   Levofloxacin Hives   Cymbalta [Duloxetine Hcl]     Headache, shortness of breath   Paroxetine Hives   Terbinafine Hcl Hives    Family History: Family History  Problem Relation Age of Onset   Dementia Mother    Diabetes  Mother    Coronary artery disease Father    Heart attack Father    Cancer Brother    Coronary artery disease Other        family hx    Social History:  reports that he has been smoking cigarettes. He started smoking about 59 years ago. He has a 22.50 pack-year smoking history. He has never used smokeless tobacco. He reports current alcohol use. He reports that he does not use drugs.  ROS: All other review of systems were reviewed and are negative except what is noted above in HPI  Physical Exam: BP 100/65   Pulse 87   Constitutional:  Alert and oriented, No acute distress. HEENT: [place] AT, moist mucus membranes.  Trachea midline, no masses. Cardiovascular: No clubbing, cyanosis, or edema. Respiratory: Normal respiratory effort, no increased work of breathing. GI: Abdomen is soft, nontender, nondistended, no abdominal masses GU: No CVA tenderness.  Lymph: No cervical or inguinal lymphadenopathy. Skin: No rashes, bruises or suspicious lesions. Neurologic: Grossly intact, no focal deficits, moving all 4 extremities. Psychiatric: Normal mood and affect.  Laboratory Data: Lab Results  Component Value Date   WBC 11.3 (H) 11/11/2007   HGB 14.0 11/11/2007   HCT 42.4 11/11/2007   MCV 92.5 11/11/2007   PLT 275 11/11/2007    Lab Results  Component Value Date   CREATININE 1.08 11/29/2020    No results found for: PSA  No results found for: TESTOSTERONE  Lab Results  Component Value Date   HGBA1C 6.7 (H) 11/29/2020    Urinalysis    Component  Value Date/Time   APPEARANCEUR Clear 10/25/2020 1359   GLUCOSEU Negative 10/25/2020 1359   BILIRUBINUR Negative 10/25/2020 1359   PROTEINUR Negative 10/25/2020 1359   NITRITE Negative 10/25/2020 1359   LEUKOCYTESUR Negative 10/25/2020 1359    Lab Results  Component Value Date   LABMICR Comment 10/25/2020   WBCUA 6-10 (A) 09/11/2020   LABEPIT 0-10 09/11/2020   BACTERIA Few (A) 09/11/2020    Pertinent Imaging:  No results found for this or any previous visit.  No results found for this or any previous visit.  No results found for this or any previous visit.  No results found for this or any previous visit.  No results found for this or any previous visit.  No results found for this or any previous visit.  No results found for this or any previous visit.  No results found for this or any previous visit.   Assessment & Plan:    1. Urinary retention -Improved after Urolift - Urinalysis, Routine w reflex microscopic  2. Benign localized prostatic hyperplasia with lower urinary tract symptoms (LUTS) -RTC 2 months with PVR   No follow-ups on file.  Nicolette Bang, MD  [facility] [place]

## 2020-12-13 NOTE — Patient Instructions (Signed)
Benign Prostatic Hyperplasia  Benign prostatic hyperplasia (BPH) is an enlarged prostate gland that is caused by the normal aging process and not by cancer. The prostate is a walnut-sized gland that is involved in the production of semen. It is located in front of the rectum and below the bladder. The bladder stores urine and the urethra is the tube that carries the urine out of the body. The prostate may get bigger asa man gets older. An enlarged prostate can press on the urethra. This can make it harder to pass urine. The build-up of urine in the bladder can cause infection. Back pressure and infection may progress to bladder damage and kidney (renal) failure. What are the causes? This condition is part of a normal aging process. However, not all men develop problems from this condition. If the prostate enlarges away from the urethra, urine flow will not be blocked. If it enlarges toward the urethra andcompresses it, there will be problems passing urine. What increases the risk? This condition is more likely to develop in men over the age of 50 years. What are the signs or symptoms? Symptoms of this condition include: Getting up often during the night to urinate. Needing to urinate frequently during the day. Difficulty starting urine flow. Decrease in size and strength of your urine stream. Leaking (dribbling) after urinating. Inability to pass urine. This needs immediate treatment. Inability to completely empty your bladder. Pain when you pass urine. This is more common if there is also an infection. Urinary tract infection (UTI). How is this diagnosed? This condition is diagnosed based on your medical history, a physical exam, and your symptoms. Tests will also be done, such as: A post-void bladder scan. This measures any amount of urine that may remain in your bladder after you finish urinating. A digital rectal exam. In a rectal exam, your health care provider checks your prostate by  putting a lubricated, gloved finger into your rectum to feel the back of your prostate gland. This exam detects the size of your gland and any abnormal lumps or growths. An exam of your urine (urinalysis). A prostate specific antigen (PSA) screening. This is a blood test used to screen for prostate cancer. An ultrasound. This test uses sound waves to electronically produce a picture of your prostate gland. Your health care provider may refer you to a specialist in kidney and prostate diseases (urologist). How is this treated? Once symptoms begin, your health care provider will monitor your condition (active surveillance or watchful waiting). Treatment for this condition will depend on the severity of your condition. Treatment may include: Observation and yearly exams. This may be the only treatment needed if your condition and symptoms are mild. Medicines to relieve your symptoms, including: Medicines to shrink the prostate. Medicines to relax the muscle of the prostate. Surgery in severe cases. Surgery may include: Prostatectomy. In this procedure, the prostate tissue is removed completely through an open incision or with a laparoscope or robotics. Transurethral resection of the prostate (TURP). In this procedure, a tool is inserted through the opening at the tip of the penis (urethra). It is used to cut away tissue of the inner core of the prostate. The pieces are removed through the same opening of the penis. This removes the blockage. Transurethral incision (TUIP). In this procedure, small cuts are made in the prostate. This lessens the prostate's pressure on the urethra. Transurethral microwave thermotherapy (TUMT). This procedure uses microwaves to create heat. The heat destroys and removes a small   amount of prostate tissue. Transurethral needle ablation (TUNA). This procedure uses radio frequencies to destroy and remove a small amount of prostate tissue. Interstitial laser coagulation (ILC).  This procedure uses a laser to destroy and remove a small amount of prostate tissue. Transurethral electrovaporization (TUVP). This procedure uses electrodes to destroy and remove a small amount of prostate tissue. Prostatic urethral lift. This procedure inserts an implant to push the lobes of the prostate away from the urethra. Follow these instructions at home: Take over-the-counter and prescription medicines only as told by your health care provider. Monitor your symptoms for any changes. Contact your health care provider with any changes. Avoid drinking large amounts of liquid before going to bed or out in public. Avoid or reduce how much caffeine or alcohol you drink. Give yourself time when you urinate. Keep all follow-up visits as told by your health care provider. This is important. Contact a health care provider if: You have unexplained back pain. Your symptoms do not get better with treatment. You develop side effects from the medicine you are taking. Your urine becomes very dark or has a bad smell. Your lower abdomen becomes distended and you have trouble passing your urine. Get help right away if: You have a fever or chills. You suddenly cannot urinate. You feel lightheaded, or very dizzy, or you faint. There are large amounts of blood or clots in the urine. Your urinary problems become hard to manage. You develop moderate to severe low back or flank pain. The flank is the side of your body between the ribs and the hip. These symptoms may represent a serious problem that is an emergency. Do not wait to see if the symptoms will go away. Get medical help right away. Call your local emergency services (911 in the [place] Do not drive yourself to the hospital. Summary Benign prostatic hyperplasia (BPH) is an enlarged prostate that is caused by the normal aging process and not by cancer. An enlarged prostate can press on the urethra. This can make it hard to pass urine. This  condition is part of a normal aging process and is more likely to develop in men over the age of 50 years. Get help right away if you suddenly cannot urinate. This information is not intended to replace advice given to you by your health care provider. Make sure you discuss any questions you have with your healthcare provider. Document Revised: 01/13/2020 Document Reviewed: 01/13/2020 [employer] Patient Education  2022 [employer]  

## 2020-12-13 NOTE — Progress Notes (Signed)
post void residual=374  Urological Symptom Review  Patient is experiencing the following symptoms: Burning/pain with urination   Review of Systems  Gastrointestinal (upper)  : Negative for upper GI symptoms  Gastrointestinal (lower) : Negative for lower GI symptoms  Constitutional : Negative for symptoms  Skin: Negative for skin symptoms  Eyes: Negative for eye symptoms  Ear/Nose/Throat : Negative for Ear/Nose/Throat symptoms  Hematologic/Lymphatic: Negative for Hematologic/Lymphatic symptoms  Cardiovascular : Negative for cardiovascular symptoms  Respiratory : Negative for respiratory symptoms  Endocrine: Negative for endocrine symptoms  Musculoskeletal: Negative for musculoskeletal symptoms  Neurological: Negative for neurological symptoms  Psychologic: Negative for psychiatric symptoms

## 2020-12-17 ENCOUNTER — Encounter: Payer: Self-pay | Admitting: Urology

## 2020-12-17 NOTE — Progress Notes (Signed)
12/06/2020 12:13 AM   Eddie Shaggy Sr. Oct 03, 1950 [id number]  Referring provider: Practice, [place],  [place] [address]  Chief Complaint  Patient presents with   Follow-up    2 day voiding trial    HPI: Eddie Reed is a 70yo here for followup after Urolift. Voiding trial passed today. No hematuria. No dysuria. No complaints today   PMH: Past Medical History:  Diagnosis Date   Allergy history, drug    plavix   Anxiety    Arm pain    continued discomfort in his arm- seeing Dr. Annie Main   Blind right eye    CAD (coronary artery disease)    catheterizzation. 11/10/07. patent LIMA to the LAD with competitive filling, patent SVG to diagonal that enters a bifurcation point, occluded SVG to the distal marginal w/some collateralization, patent SVG to distal posterior lateral. medical therapy recommended.  EF 55% echo. June,2009. hypokinesis of the mid and distal septal wall. technically difficult study.   Carotid artery disease ([place])    Doppler, July, 2012, 0-39% bilateral, followup 2 years   Cellulitis    s/p right lower extremity cellulitis   Claudication ([place])    Arterial Dopplers, July, 2012, normal with normal ABI   Diabetes mellitus    type not specified   Drug intolerance    Patient is intolerant to both aspirin and Plavix.   Dyslipidemia    ED (erectile dysfunction)    Ejection fraction    EF 55%, echo, June, 2009, hypokinesis mid/distal septal wall, technically difficult study   Fibromyalgia    GERD (gastroesophageal reflux disease)    Glaucoma    right eye   H/O alcohol abuse    Alcohol abuse in the past   Hypertension    Polyneuropathy in diabetes(357.2) 10/06/2013   Psoriatic arthritis ([place])    Ringing in ear    June, 2012 /  carotid Dopplers, in July, 2012, 0-39% bilateral   S/P CABG x 1 09/18/2007   2009   Stroke due to embolism of cerebral artery ([place]) 03/08/2015   Right MCA   Tobacco abuse     Surgical History: Past Surgical  History:  Procedure Laterality Date   CATARACT EXTRACTION Right    CORONARY ARTERY BYPASS GRAFT     CYSTOSCOPY WITH INSERTION OF UROLIFT N/A 12/04/2020   Procedure: CYSTOSCOPY WITH INSERTION OF UROLIFT;  Surgeon: Cleon Gustin, MD;  Location: [facility];  Service: Urology;  Laterality: N/A;    Home Medications:  Allergies as of 12/06/2020       Reactions   Clopidogrel Hives   Levofloxacin Hives   Cymbalta [duloxetine Hcl]    Headache, shortness of breath   Paroxetine Hives   Terbinafine Hcl Hives        Medication List        Accurate as of December 06, 2020 11:59 PM. If you have any questions, ask your nurse or doctor.          albuterol 108 (90 Base) MCG/ACT inhaler Commonly known as: VENTOLIN HFA Inhale 2 puffs into the lungs 4 (four) times daily as needed.   alfuzosin 10 MG 24 hr tablet Commonly known as: UROXATRAL Take 10 mg by mouth daily.   ALPRAZolam 1 MG tablet Commonly known as: XANAX Take 1 mg by mouth 3 (three) times daily as needed.   amLODipine 5 MG tablet Commonly known as: NORVASC Take 5 mg by mouth daily.   aspirin 81 MG tablet Take  81 mg by mouth daily.   atorvastatin 20 MG tablet Commonly known as: LIPITOR Take 20 mg by mouth at bedtime.   benazepril 20 MG tablet Commonly known as: LOTENSIN Take 20 mg by mouth daily.   betamethasone dipropionate 0.05 % cream Apply 1 application topically daily.   clobetasol 0.05 % topical foam Commonly known as: OLUX Apply 1 application topically 2 (two) times daily.   diclofenac sodium 1 % Gel Commonly known as: VOLTAREN Apply 2 g topically 3 (three) times daily.   esomeprazole 40 MG capsule Commonly known as: NEXIUM Take 40 mg by mouth daily at 12 noon.   etanercept 50 MG/ML injection Commonly known as: ENBREL Inject 50 mg into the skin once a week.   finasteride 5 MG tablet Commonly known as: PROSCAR Take 1 tablet by mouth daily.   Fish Oil 1000 MG Cpdr Take 1 capsule by mouth 2  (two) times daily.   fluticasone 50 MCG/ACT nasal spray Commonly known as: FLONASE Place 2 sprays into both nostrils daily.   gabapentin 800 MG tablet Commonly known as: NEURONTIN Take 800 mg by mouth 4 (four) times daily.   glipiZIDE 5 MG tablet Commonly known as: GLUCOTROL Take 5 mg by mouth 2 (two) times daily before a meal.   HYDROcodone-acetaminophen 10-325 MG tablet Commonly known as: NORCO Take 1 tablet by mouth 4 (four) times daily.   hydrOXYzine 25 MG tablet Commonly known as: ATARAX/VISTARIL Take 25 mg by mouth every 8 (eight) hours as needed.   ketoconazole 2 % cream Commonly known as: NIZORAL Apply topically 2 (two) times daily. to affected area(s)   KRILL OIL PO Take 1 capsule by mouth daily.   lidocaine 5 % ointment Commonly known as: XYLOCAINE APPLY TO AFFECTED AREA THREE TIMES DAILY AS NEEDED   metFORMIN 1000 MG tablet Commonly known as: GLUCOPHAGE Take 1,000 mg by mouth 2 (two) times daily.   metoprolol tartrate 25 MG tablet Commonly known as: LOPRESSOR Take 1 tablet (25 mg total) by mouth 2 (two) times daily.   multivitamin tablet Take 1 tablet by mouth daily.   nitroGLYCERIN 0.4 MG SL tablet Commonly known as: NITROSTAT Place 1 tablet (0.4 mg total) under the tongue every 5 (five) minutes x 3 doses as needed for chest pain (if no relief after 2nd dose, proceed to the ED for an evaluation or call 911).   QUEtiapine 50 MG tablet Commonly known as: SEROQUEL Take 50 mg by mouth at bedtime as needed. Takes if someone is home with him   silodosin 8 MG Caps capsule Commonly known as: RAPAFLO Take 1 capsule (8 mg total) by mouth daily with breakfast.   tamsulosin 0.4 MG Caps capsule Commonly known as: FLOMAX Take 0.4 mg by mouth daily after supper.   traMADol 50 MG tablet Commonly known as: Ultram Take 1 tablet (50 mg total) by mouth every 6 (six) hours as needed.   VIAGRA PO Take by mouth as needed.   Vitamin D3 50 MCG (2000 UT)  capsule Take 2,000 Units by mouth daily.        Allergies:  Allergies  Allergen Reactions   Clopidogrel Hives   Levofloxacin Hives   Cymbalta [Duloxetine Hcl]     Headache, shortness of breath   Paroxetine Hives   Terbinafine Hcl Hives    Family History: Family History  Problem Relation Age of Onset   Dementia Mother    Diabetes Mother    Coronary artery disease Father    Heart attack  Father    Cancer Brother    Coronary artery disease Other        family hx    Social History:  reports that he has been smoking cigarettes. He started smoking about 59 years ago. He has a 22.50 pack-year smoking history. He has never used smokeless tobacco. He reports current alcohol use. He reports that he does not use drugs.  ROS: All other review of systems were reviewed and are negative except what is noted above in HPI  Physical Exam: BP 119/78   Pulse 82   Temp 98.4 F (36.9 C) (Oral)   Wt 184 lb 3.2 oz (83.6 kg)   BMI 26.43 kg/m   Constitutional:  Alert and oriented, No acute distress. HEENT:  AT, moist mucus membranes.  Trachea midline, no masses. Cardiovascular: No clubbing, cyanosis, or edema. Respiratory: Normal respiratory effort, no increased work of breathing. GI: Abdomen is soft, nontender, nondistended, no abdominal masses GU: No CVA tenderness.  Lymph: No cervical or inguinal lymphadenopathy. Skin: No rashes, bruises or suspicious lesions. Neurologic: Grossly intact, no focal deficits, moving all 4 extremities. Psychiatric: Normal mood and affect.  Laboratory Data: Lab Results  Component Value Date   WBC 11.3 (H) 11/11/2007   HGB 14.0 11/11/2007   HCT 42.4 11/11/2007   MCV 92.5 11/11/2007   PLT 275 11/11/2007    Lab Results  Component Value Date   CREATININE 1.08 11/29/2020    No results found for: PSA  No results found for: TESTOSTERONE  Lab Results  Component Value Date   HGBA1C 6.7 (H) 11/29/2020    Urinalysis    Component Value  Date/Time   APPEARANCEUR Clear 10/25/2020 1359   GLUCOSEU Negative 10/25/2020 1359   BILIRUBINUR Negative 10/25/2020 1359   PROTEINUR Negative 10/25/2020 1359   NITRITE Negative 10/25/2020 1359   LEUKOCYTESUR Negative 10/25/2020 1359    Lab Results  Component Value Date   LABMICR Comment 10/25/2020   WBCUA 6-10 (A) 09/11/2020   LABEPIT 0-10 09/11/2020   BACTERIA Few (A) 09/11/2020    Pertinent Imaging:  No results found for this or any previous visit.  No results found for this or any previous visit.  No results found for this or any previous visit.  No results found for this or any previous visit.  No results found for this or any previous visit.  No results found for this or any previous visit.  No results found for this or any previous visit.  No results found for this or any previous visit.   Assessment & Plan:    1. Urinary retention -RTC 1 week for PVR  2. Benign localized prostatic hyperplasia with lower urinary tract symptoms (LUTS) -RTC 1 week for PVR   No follow-ups on file.  Nicolette Bang, MD  [facility] [place]

## 2020-12-17 NOTE — Patient Instructions (Signed)
Benign Prostatic Hyperplasia  Benign prostatic hyperplasia (BPH) is an enlarged prostate gland that is caused by the normal aging process and not by cancer. The prostate is a walnut-sized gland that is involved in the production of semen. It is located in front of the rectum and below the bladder. The bladder stores urine and the urethra is the tube that carries the urine out of the body. The prostate may get bigger asa man gets older. An enlarged prostate can press on the urethra. This can make it harder to pass urine. The build-up of urine in the bladder can cause infection. Back pressure and infection may progress to bladder damage and kidney (renal) failure. What are the causes? This condition is part of a normal aging process. However, not all men develop problems from this condition. If the prostate enlarges away from the urethra, urine flow will not be blocked. If it enlarges toward the urethra andcompresses it, there will be problems passing urine. What increases the risk? This condition is more likely to develop in men over the age of 50 years. What are the signs or symptoms? Symptoms of this condition include: Getting up often during the night to urinate. Needing to urinate frequently during the day. Difficulty starting urine flow. Decrease in size and strength of your urine stream. Leaking (dribbling) after urinating. Inability to pass urine. This needs immediate treatment. Inability to completely empty your bladder. Pain when you pass urine. This is more common if there is also an infection. Urinary tract infection (UTI). How is this diagnosed? This condition is diagnosed based on your medical history, a physical exam, and your symptoms. Tests will also be done, such as: A post-void bladder scan. This measures any amount of urine that may remain in your bladder after you finish urinating. A digital rectal exam. In a rectal exam, your health care provider checks your prostate by  putting a lubricated, gloved finger into your rectum to feel the back of your prostate gland. This exam detects the size of your gland and any abnormal lumps or growths. An exam of your urine (urinalysis). A prostate specific antigen (PSA) screening. This is a blood test used to screen for prostate cancer. An ultrasound. This test uses sound waves to electronically produce a picture of your prostate gland. Your health care provider may refer you to a specialist in kidney and prostate diseases (urologist). How is this treated? Once symptoms begin, your health care provider will monitor your condition (active surveillance or watchful waiting). Treatment for this condition will depend on the severity of your condition. Treatment may include: Observation and yearly exams. This may be the only treatment needed if your condition and symptoms are mild. Medicines to relieve your symptoms, including: Medicines to shrink the prostate. Medicines to relax the muscle of the prostate. Surgery in severe cases. Surgery may include: Prostatectomy. In this procedure, the prostate tissue is removed completely through an open incision or with a laparoscope or robotics. Transurethral resection of the prostate (TURP). In this procedure, a tool is inserted through the opening at the tip of the penis (urethra). It is used to cut away tissue of the inner core of the prostate. The pieces are removed through the same opening of the penis. This removes the blockage. Transurethral incision (TUIP). In this procedure, small cuts are made in the prostate. This lessens the prostate's pressure on the urethra. Transurethral microwave thermotherapy (TUMT). This procedure uses microwaves to create heat. The heat destroys and removes a small   amount of prostate tissue. Transurethral needle ablation (TUNA). This procedure uses radio frequencies to destroy and remove a small amount of prostate tissue. Interstitial laser coagulation (ILC).  This procedure uses a laser to destroy and remove a small amount of prostate tissue. Transurethral electrovaporization (TUVP). This procedure uses electrodes to destroy and remove a small amount of prostate tissue. Prostatic urethral lift. This procedure inserts an implant to push the lobes of the prostate away from the urethra. Follow these instructions at home: Take over-the-counter and prescription medicines only as told by your health care provider. Monitor your symptoms for any changes. Contact your health care provider with any changes. Avoid drinking large amounts of liquid before going to bed or out in public. Avoid or reduce how much caffeine or alcohol you drink. Give yourself time when you urinate. Keep all follow-up visits as told by your health care provider. This is important. Contact a health care provider if: You have unexplained back pain. Your symptoms do not get better with treatment. You develop side effects from the medicine you are taking. Your urine becomes very dark or has a bad smell. Your lower abdomen becomes distended and you have trouble passing your urine. Get help right away if: You have a fever or chills. You suddenly cannot urinate. You feel lightheaded, or very dizzy, or you faint. There are large amounts of blood or clots in the urine. Your urinary problems become hard to manage. You develop moderate to severe low back or flank pain. The flank is the side of your body between the ribs and the hip. These symptoms may represent a serious problem that is an emergency. Do not wait to see if the symptoms will go away. Get medical help right away. Call your local emergency services (911 in the [place] Do not drive yourself to the hospital. Summary Benign prostatic hyperplasia (BPH) is an enlarged prostate that is caused by the normal aging process and not by cancer. An enlarged prostate can press on the urethra. This can make it hard to pass urine. This  condition is part of a normal aging process and is more likely to develop in men over the age of 50 years. Get help right away if you suddenly cannot urinate. This information is not intended to replace advice given to you by your health care provider. Make sure you discuss any questions you have with your healthcare provider. Document Revised: 01/13/2020 Document Reviewed: 01/13/2020 [employer] Patient Education  2022 [employer]  

## 2020-12-25 ENCOUNTER — Telehealth: Payer: Self-pay

## 2020-12-25 NOTE — Telephone Encounter (Signed)
Pharmacy called to ask clarify medication for silodosin given by PCP and alfuzosin given by urologist.   Patient had urolift in July and 2 week post op course should be completed.  Pharmacy notified and called and discussed with patient. Patient voiced understanding of not needing medication any longer.

## 2021-02-12 ENCOUNTER — Ambulatory Visit: Payer: Medicare Other | Admitting: Urology

## 2021-03-02 ENCOUNTER — Ambulatory Visit (INDEPENDENT_AMBULATORY_CARE_PROVIDER_SITE_OTHER): Payer: Medicare Other | Admitting: Cardiology

## 2021-03-02 ENCOUNTER — Encounter: Payer: Self-pay | Admitting: Cardiology

## 2021-03-02 ENCOUNTER — Other Ambulatory Visit: Payer: Self-pay

## 2021-03-02 VITALS — BP 124/60 | HR 67 | Ht 70.0 in | Wt 186.4 lb

## 2021-03-02 DIAGNOSIS — I10 Essential (primary) hypertension: Secondary | ICD-10-CM | POA: Diagnosis not present

## 2021-03-02 DIAGNOSIS — E782 Mixed hyperlipidemia: Secondary | ICD-10-CM | POA: Diagnosis not present

## 2021-03-02 DIAGNOSIS — I2510 Atherosclerotic heart disease of native coronary artery without angina pectoris: Secondary | ICD-10-CM

## 2021-03-02 DIAGNOSIS — R0602 Shortness of breath: Secondary | ICD-10-CM

## 2021-03-02 NOTE — Patient Instructions (Signed)
Medication Instructions:  Your physician recommends that you continue on your current medications as directed. Please refer to the Current Medication list given to you today.  *If you need a refill on your cardiac medications before your next appointment, please call your pharmacy*   Lab Work: We have requested your recent lab work from your PCP If you have labs (blood work) drawn today and your tests are completely normal, you will receive your results only by: [place] (if you have MyChart) OR A paper copy in the mail If you have any lab test that is abnormal or we need to change your treatment, we will call you to review the results.   Testing/Procedures: Your physician has requested that you have an echocardiogram. Echocardiography is a painless test that uses sound waves to create images of your heart. It provides your doctor with information about the size and shape of your heart and how well your heart's chambers and valves are working. This procedure takes approximately one hour. There are no restrictions for this procedure.    Follow-Up: At [facility], you and your health needs are our priority.  As part of our continuing mission to provide you with exceptional heart care, we have created designated Provider Care Teams.  These Care Teams include your primary Cardiologist (physician) and Advanced Practice Providers (APPs -  Physician Assistants and Nurse Practitioners) who all work together to provide you with the care you need, when you need it.  We recommend signing up for the patient portal called "MyChart".  Sign up information is provided on this After Visit Summary.  MyChart is used to connect with patients for Virtual Visits (Telemedicine).  Patients are able to view lab/test results, encounter notes, upcoming appointments, etc.  Non-urgent messages can be sent to your provider as well.   To learn more about what you can do with MyChart, go to [email or web address].     Your next appointment:   6 month(s)  The format for your next appointment:   In Person  Provider:   Carlyle Dolly, MD   Other Instructions

## 2021-03-02 NOTE — Progress Notes (Signed)
Clinical Summary Eddie Reed is a 70 y.o.male seen today for follow up of the following medical problems.    1. CAD - history of prior CABG. Last cath 2009 as described below, patent grafts other than occluded SVG-marginal however there were collaterals and medically managed - reports allergy to plavix. Reports ASA upsets stomach. Recently back on ASA and having some burning feeling at times in epigastrium. Has tolerated combined with PPI.        01/2019 nuclear stress: prior scar, no current ischemia     - no recent chest pains - some chronic SOB with moderate progression - no recent edema.        2. Hyperlipidemia  - labs followed by his pcp - compliant with atorvastatin     3. DM2 - followed by pcp     4. History of CVA - presented with acute left sided weakness to [place]. Did not receive tPA,  transferred to [facility] - MRI of the brain without contrast on showed Multiple small acute/recent embolic type infarcts within the MCA territory of the right frontal and parietal lobes with no associated hemorrhage or mass effect - Echocardiogram with Doppler complete with bubble study was obtained on 02/18/15, showed no cardiac etiology for the stroke.Left ventricular ejection fraction was 55 %. - reports weakness has resolved.      Past Medical History:  Diagnosis Date   Allergy history, drug    plavix   Anxiety    Arm pain    continued discomfort in his arm- seeing Dr. Annie Main   Blind right eye    CAD (coronary artery disease)    catheterizzation. 11/10/07. patent LIMA to the LAD with competitive filling, patent SVG to diagonal that enters a bifurcation point, occluded SVG to the distal marginal w/some collateralization, patent SVG to distal posterior lateral. medical therapy recommended.  EF 55% echo. June,2009. hypokinesis of the mid and distal septal wall. technically difficult study.   Carotid artery disease ([place])    Doppler, July, 2012, 0-39% bilateral,  followup 2 years   Cellulitis    s/p right lower extremity cellulitis   Claudication ([place])    Arterial Dopplers, July, 2012, normal with normal ABI   Diabetes mellitus    type not specified   Drug intolerance    Patient is intolerant to both aspirin and Plavix.   Dyslipidemia    ED (erectile dysfunction)    Ejection fraction    EF 55%, echo, June, 2009, hypokinesis mid/distal septal wall, technically difficult study   Fibromyalgia    GERD (gastroesophageal reflux disease)    Glaucoma    right eye   H/O alcohol abuse    Alcohol abuse in the past   Hypertension    Polyneuropathy in diabetes(357.2) 10/06/2013   Psoriatic arthritis ([place])    Ringing in ear    June, 2012 /  carotid Dopplers, in July, 2012, 0-39% bilateral   S/P CABG x 1 09/18/2007   2009   Stroke due to embolism of cerebral artery ([place]) 03/08/2015   Right MCA   Tobacco abuse      Allergies  Allergen Reactions   Clopidogrel Hives   Levofloxacin Hives   Cymbalta [Duloxetine Hcl]     Headache, shortness of breath   Paroxetine Hives   Terbinafine Hcl Hives     Current Outpatient Medications  Medication Sig Dispense Refill   albuterol (VENTOLIN HFA) 108 (90 Base) MCG/ACT inhaler Inhale 2 puffs into the lungs 4 (four)  times daily as needed.     ALPRAZolam (XANAX) 1 MG tablet Take 1 mg by mouth 3 (three) times daily as needed.     amLODipine (NORVASC) 5 MG tablet Take 5 mg by mouth daily.     aspirin 81 MG tablet Take 81 mg by mouth daily.     atorvastatin (LIPITOR) 20 MG tablet Take 20 mg by mouth at bedtime.     benazepril (LOTENSIN) 20 MG tablet Take 20 mg by mouth daily.      betamethasone dipropionate (DIPROLENE) 0.05 % cream Apply 1 application topically daily.      Cholecalciferol (VITAMIN D3) 50 MCG (2000 UT) capsule Take 2,000 Units by mouth daily.     clobetasol (OLUX) 0.05 % topical foam Apply 1 application topically 2 (two) times daily.     diclofenac sodium (VOLTAREN) 1 % GEL Apply 2 g topically  3 (three) times daily.     esomeprazole (NEXIUM) 40 MG capsule Take 40 mg by mouth daily at 12 noon.     etanercept (ENBREL) 50 MG/ML injection Inject 50 mg into the skin once a week.     finasteride (PROSCAR) 5 MG tablet Take 1 tablet by mouth daily.     fluticasone (FLONASE) 50 MCG/ACT nasal spray Place 2 sprays into both nostrils daily.     gabapentin (NEURONTIN) 800 MG tablet Take 800 mg by mouth 4 (four) times daily.     glipiZIDE (GLUCOTROL) 5 MG tablet Take 5 mg by mouth 2 (two) times daily before a meal.      HYDROcodone-acetaminophen (NORCO) 10-325 MG tablet Take 1 tablet by mouth 4 (four) times daily.     hydrOXYzine (ATARAX/VISTARIL) 25 MG tablet Take 25 mg by mouth every 8 (eight) hours as needed.      ketoconazole (NIZORAL) 2 % cream Apply topically 2 (two) times daily. to affected area(s)     KRILL OIL PO Take 1 capsule by mouth daily.     lidocaine (XYLOCAINE) 5 % ointment APPLY TO AFFECTED AREA THREE TIMES DAILY AS NEEDED 30 g 1   metFORMIN (GLUCOPHAGE) 1000 MG tablet Take 1,000 mg by mouth 2 (two) times daily.     metoprolol tartrate (LOPRESSOR) 25 MG tablet Take 1 tablet (25 mg total) by mouth 2 (two) times daily. 180 tablet 1   Multiple Vitamin (MULTIVITAMIN) tablet Take 1 tablet by mouth daily.     nitroGLYCERIN (NITROSTAT) 0.4 MG SL tablet Place 1 tablet (0.4 mg total) under the tongue every 5 (five) minutes x 3 doses as needed for chest pain (if no relief after 2nd dose, proceed to the ED for an evaluation or call 911). 25 tablet 3   Omega-3 Fatty Acids (FISH OIL) 1000 MG CPDR Take 1 capsule by mouth 2 (two) times daily.     QUEtiapine (SEROQUEL) 50 MG tablet Take 50 mg by mouth at bedtime as needed. Takes if someone is home with him     Sildenafil Citrate (VIAGRA PO) Take by mouth as needed.     traMADol (ULTRAM) 50 MG tablet Take 1 tablet (50 mg total) by mouth every 6 (six) hours as needed. 15 tablet 0   No current facility-administered medications for this visit.      Past Surgical History:  Procedure Laterality Date   CATARACT EXTRACTION Right    CORONARY ARTERY BYPASS GRAFT     CYSTOSCOPY WITH INSERTION OF UROLIFT N/A 12/04/2020   Procedure: CYSTOSCOPY WITH INSERTION OF UROLIFT;  Surgeon: Cleon Gustin, MD;  Location: [facility];  Service: Urology;  Laterality: N/A;     Allergies  Allergen Reactions   Clopidogrel Hives   Levofloxacin Hives   Cymbalta [Duloxetine Hcl]     Headache, shortness of breath   Paroxetine Hives   Terbinafine Hcl Hives      Family History  Problem Relation Age of Onset   Dementia Mother    Diabetes Mother    Coronary artery disease Father    Heart attack Father    Cancer Brother    Coronary artery disease Other        family hx     Social History Eddie Reed reports that he has been smoking cigarettes. He started smoking about 59 years ago. He has a 22.50 pack-year smoking history. He has never used smokeless tobacco. Eddie Reed reports current alcohol use.   Review of Systems CONSTITUTIONAL: No weight loss, fever, chills, weakness or fatigue.  HEENT: Eyes: No visual loss, blurred vision, double vision or yellow sclerae.No hearing loss, sneezing, congestion, runny nose or sore throat.  SKIN: No rash or itching.  CARDIOVASCULAR: per hpi RESPIRATORY: No shortness of breath, cough or sputum.  GASTROINTESTINAL: No anorexia, nausea, vomiting or diarrhea. No abdominal pain or blood.  GENITOURINARY: No burning on urination, no polyuria NEUROLOGICAL: No headache, dizziness, syncope, paralysis, ataxia, numbness or tingling in the extremities. No change in bowel or bladder control.  MUSCULOSKELETAL: No muscle, back pain, joint pain or stiffness.  LYMPHATICS: No enlarged nodes. No history of splenectomy.  PSYCHIATRIC: No history of depression or anxiety.  ENDOCRINOLOGIC: No reports of sweating, cold or heat intolerance. No polyuria or polydipsia.  Marland Kitchen   Physical Examination Today's Vitals   03/02/21 1408   BP: 124/60  Pulse: 67  SpO2: 97%  Weight: 186 lb 6.4 oz (84.6 kg)  Height: 5\' 10"  (1.778 m)   Body mass index is 26.75 kg/m.  Gen: resting comfortably, no acute distress HEENT: no scleral icterus, pupils equal round and reactive, no palptable cervical adenopathy,  CV: RRR, no mr/g no jvd Resp: bilateral wheezing GI: abdomen is soft, non-tender, non-distended, normal bowel sounds, no hepatosplenomegaly MSK: extremities are warm, no edema.  Skin: warm, no rash Neuro:  no focal deficits Psych: appropriate affect   Diagnostic Studies  09/2014 Carotid US Bilateral 1-39% carotid stenosis   10/2007 echo  SUMMARY  -  Overall left ventricular systolic function was normal. Left        ventricular ejection fraction was estimated to be 55 %.        Findings were suggestive of hypokinesis of the mid-distal        septal wall. Left ventricular wall thickness was mildly        increased.     10/2007 CathHEMODYNAMIC DATA:  Central aortic pressure was 106/73, mean 88.    ANGIOGRAPHIC DATA:  1. The left main was free of critical disease.  2. The LAD coursed to the apex.  The LAD has been stented in the acute      setting prior to a surgery.  There is flow down the LAD and what      appears to be competitive flow after a septal perforator.  The      stent has mild luminal irregularities, but is intact.  It is a bare-      metal stent.  There is competitive filling in the distal vessel.      Importantly, injection of the internal mammary reveals an internal  mammary that is patent.  There is perhaps a 30-40% area of bending      in the vessel, and the distal LAD does not fill through the      internal mammary nor does you see it well from the graft, although      there is clear evidence of the competitive filling of the distal      LAD territory.  The major diagonal Wilbert Hayashi has 80% narrowing and      then 90% narrowing then bifurcates.  One bifurcation has      substantial disease and  the graft appears to enter right just      beyond this or in the other Liahna Brickner where there is evidence of clear-      cut competitive filling.  3. The saphenous vein graft to the diagonal Collis Thede is widely patent.      There is slow runoff of the graft, but that is due to size      mismatch.  4. There is a small ramus intermedius that appears to be subtotally      occluded.  There is what appears to be competitive filling distally      and this is likely due to collateralization.  Likewise, the      circumflex is totally occluded and the OM graft is totally      occluded.  5. The right coronary artery has multiple lesions of 50% after a      stent.  There is a 30% lesion and some diffuse luminal      irregularities of 30-40% distally.  There is then an 80% stenosis      after a small PDA and then evidence of competitive filling      distally.  There is a vein graft to the PDA that appears to be      angiographically intact.    CONCLUSION:  1. Continued patency of the left internal mammary to the LAD, but with      evidence of what appears to be some competitive filling.  2. Patent saphenous vein graft to a diagonal that enters at a      bifurcation point.  3. Occluded saphenous vein graft to the distal marginal with at least      collateralization of what is likely the ramus and/or circumflex      itself.  4. Continued patency of the saphenous vein graft to the distal      posterolateral segment.    Cath DISPOSITION:  At the present time, cardiac rehab and medical therapy  will be recommended.     02/2015 [facility] Carotid US IMPRESSION: Bilateral ASVD. No significant stenosis.      Stenosis is validated by velocity measurements with corresponding angiographic measurements and velocity criteria extrapolated in a process based on that defined by the Society of Radiologists in Ultrasound.     02/2015 [facility] Echo Interpretation Summary   A complete portable two-dimensional  transthoracic echocardiogram was   performed. The study was technically difficult. The left ventricle is   mildly dilated.   There is mild concentric left ventricular hypertrophy.   The aortic valve is trileaflet.   There is anterior wall hypokinesis.   Unable to adequately determine diastolic dysfunction.   The left atrium is mildly dilated.   The left ventricular ejection fraction is normal (50-55%).      Left Ventricle   The left ventricle is mildly dilated. There is mild concentric left   ventricular  hypertrophy. There is anterior wall hypokinesis. The left   ventricular ejection fraction is normal (50-55%). Unable to adequately   determine diastolic dysfunction.         Right Ventricle   The right ventricle is not well visualized. The right ventricle is grossly   normal in size and function.      Atria   The left atrium is mildly dilated. The right atrium is normal. The IVC is   normal in size.      Mitral Valve   There is mild mitral annular calcification. There is no mitral   regurgitation.         Tricuspid Valve   The tricuspid valve is grossly normal. There is no tricuspid regurgitation.      Aortic Valve   The aortic valve opens well. The aortic valve is trileaflet. There is no   aortic regurgitation present.      Pulmonic Valve   The pulmonic valve is not well visualized.      Vessels   The aortic root is normal in diameter.      Pericardium   There is no pericardial effusion.      MMode/2D Measurements & Calculations   IVSd: 1.3 cm                              LVIDd: 5.0 cm                                             LVIDs: 3.6 cm                                             LVPWd: 1.2 cm              _____________________________________________________________   LV mass(C)d: 257.0 grams                  Ao root diam: 3.7 cm      LV mass(C)dI: 120.6 grams/m2              Ao root area: 10.6 cm2                                             LA  dimension: 4.1 cm      Doppler Measurements & Calculations   MV E max vel: 90.8 cm/sec                MV dec time: 0.26 sec   MV A max vel: 74.0 cm/sec   MV E/A: 1.2       06/2018 AAA Screen Summary: Abdominal Aorta: No evidence of an abdominal aortic aneurysm was visualized. The largest aortic measurement is 2.8 cm.     01/2019 nuclear stress No diagnostic ST segment changes over baseline. No significant arrhythmias. Large, moderate to severe intensity, anteroseptal defect most prominent at the apex but extending to the base largely in the septal distribution. This region is fixed and consistent with prior infarct scar, no large ischemic territories. This is a high risk study based on infarct scar size and reduced  LVEF. Nuclear stress EF: 36%.   Assessment and Plan  1. CAD -no recent chest pains, some progression DOE - stress test 2020 without significant ischemia - several years since last echo, will repeat. If benign would think symptoms likely pulmonary given some wheezing on exam  EKG SR, LAFB, PACs   2. Hyperipidemia - request pcp labs, continue atorvastatin.      3. HTN --bp is at goal, continue current meds      Arnoldo Lenis, M.D.

## 2021-04-30 ENCOUNTER — Ambulatory Visit: Payer: Medicare Other | Admitting: Urology

## 2021-05-16 ENCOUNTER — Telehealth: Payer: Self-pay | Admitting: Cardiology

## 2021-05-16 NOTE — Telephone Encounter (Signed)
Patient was scheduled for echo 05-17-2021. He has rescheduled it for 08/23/2021 in the [facility] office.  Will need to get new percert for this Echo.

## 2021-05-17 ENCOUNTER — Other Ambulatory Visit: Payer: Medicare Other

## 2021-05-23 ENCOUNTER — Ambulatory Visit: Payer: Medicare Other | Admitting: Urology

## 2021-06-20 ENCOUNTER — Ambulatory Visit: Payer: Medicare Other | Admitting: Urology

## 2021-08-06 ENCOUNTER — Other Ambulatory Visit: Payer: Self-pay | Admitting: Urology

## 2021-08-06 DIAGNOSIS — R339 Retention of urine, unspecified: Secondary | ICD-10-CM

## 2021-08-23 ENCOUNTER — Ambulatory Visit (INDEPENDENT_AMBULATORY_CARE_PROVIDER_SITE_OTHER): Payer: Medicare Other

## 2021-08-23 DIAGNOSIS — R0602 Shortness of breath: Secondary | ICD-10-CM | POA: Diagnosis not present

## 2021-08-23 LAB — ECHOCARDIOGRAM COMPLETE
AR max vel: 2.76 cm2
AV Area VTI: 2.6 cm2
AV Area mean vel: 2.43 cm2
AV Mean grad: 3 mmHg
AV Peak grad: 5 mmHg
Ao pk vel: 1.12 m/s
Area-P 1/2: 3.34 cm2
Calc EF: 43.1 %
S' Lateral: 4.2 cm
Single Plane A2C EF: 46.5 %
Single Plane A4C EF: 41.3 %

## 2021-08-30 ENCOUNTER — Ambulatory Visit (INDEPENDENT_AMBULATORY_CARE_PROVIDER_SITE_OTHER): Payer: Medicare Other | Admitting: Cardiology

## 2021-08-30 ENCOUNTER — Encounter: Payer: Self-pay | Admitting: Cardiology

## 2021-08-30 VITALS — BP 106/80 | HR 73 | Ht 70.0 in | Wt 184.2 lb

## 2021-08-30 DIAGNOSIS — I2510 Atherosclerotic heart disease of native coronary artery without angina pectoris: Secondary | ICD-10-CM | POA: Diagnosis not present

## 2021-08-30 DIAGNOSIS — I10 Essential (primary) hypertension: Secondary | ICD-10-CM

## 2021-08-30 DIAGNOSIS — I519 Heart disease, unspecified: Secondary | ICD-10-CM | POA: Diagnosis not present

## 2021-08-30 DIAGNOSIS — Z79899 Other long term (current) drug therapy: Secondary | ICD-10-CM

## 2021-08-30 MED ORDER — SACUBITRIL-VALSARTAN 24-26 MG PO TABS: 1.0000 | ORAL_TABLET | Freq: Two times a day (BID) | ORAL | 0 refills | Status: DC

## 2021-08-30 MED ORDER — METOPROLOL SUCCINATE ER 25 MG PO TB24: 12.5000 mg | ORAL_TABLET | Freq: Every day | ORAL | 6 refills | Status: DC

## 2021-08-30 MED ORDER — SACUBITRIL-VALSARTAN 24-26 MG PO TABS: 1.0000 | ORAL_TABLET | Freq: Two times a day (BID) | ORAL | 6 refills | Status: DC

## 2021-08-30 NOTE — Patient Instructions (Addendum)
Medication Instructions:  ?Stop Amlodipine (Norvasc) ?Stop Lopressor (Metoprolol tart) ?Begin Toprol XL 12.'5mg'$  daily  ?Stop Benazepril (Lotensin) ?Start Entresto 24/'26mg'$  twice a day  - need to be off of the Benazepril for 48 hours to beginning this medication  ?Continue all other medications.    ? ?Labwork: ?BMET - order given today ?Please do in 2 weeks (around 09/13/2021) ?Office will contact with results via phone or letter.    ? ?Testing/Procedures: ?none ? ?Follow-Up: ?6  weeks ? ?Any Other Special Instructions Will Be Listed Below (If Applicable). ?Please call the office in 2 weeks with update on blood pressure readings ? ?If you need a refill on your cardiac medications before your next appointment, please call your pharmacy. ? ?

## 2021-08-30 NOTE — Progress Notes (Signed)
? ? ? ?Clinical Summary ?Eddie Reed is a 71 y.o.maleseen today for follow up of the following medical problems. Focused visit on history of CAD and recent DOE.  ?  ?1. CAD ?- history of prior CABG. Last cath 2009 as described below, patent grafts other than occluded SVG-marginal however there were collaterals and medically managed ?- reports allergy to plavix. Reports ASA upsets stomach. Recently back on ASA and having some burning feeling at times in epigastrium. Has tolerated combined with PPI.  ?  ?  ?  ?01/2019 nuclear stress: prior scar, no current ischemia ?  ?   ?- no significant edema ?- compliant with meds ? ?2. Systolic dysfunction ?- 08/2021 echo LVEF 40-45%, grade I dd ?- new diagnosis based on recent echo ?- no edema, has had some increased SOB/DOE. He is long term smoker, does not appear to be on much as far as inhalers.  ? ? ?Other medical issues not addressed this visit ?  ?2. Hyperlipidemia ? - labs followed by his pcp ?- compliant with atorvastatin ?  ?  ?3. DM2 ?- followed by pcp ?  ?  ?4. History of CVA ?- presented with acute left sided weakness to [place]. Did not receive tPA,  transferred to [facility] ?- MRI of the brain without contrast on showed Multiple small acute/recent embolic type infarcts within the MCA territory of the right frontal and parietal lobes with no associated hemorrhage or mass effect ?- Echocardiogram with Doppler complete with bubble study was obtained on 02/18/15, showed no cardiac etiology for the stroke.Left ventricular ejection fraction was 55 %. ?- reports weakness has resolved.  ?  ?  ?  ? ? ?Past Medical History:  ?Diagnosis Date  ? Allergy history, drug   ? plavix  ? Anxiety   ? Arm pain   ? continued discomfort in his arm- seeing Dr. Annie Main  ? Blind right eye   ? CAD (coronary artery disease)   ? catheterizzation. 11/10/07. patent LIMA to the LAD with competitive filling, patent SVG to diagonal that enters a bifurcation point, occluded SVG to the distal  marginal w/some collateralization, patent SVG to distal posterior lateral. medical therapy recommended.  EF 55% echo. June,2009. hypokinesis of the mid and distal septal wall. technically difficult study.  ? Carotid artery disease ([place])   ? Doppler, July, 2012, 0-39% bilateral, followup 2 years  ? Cellulitis   ? s/p right lower extremity cellulitis  ? Claudication [facility])   ? Arterial Dopplers, July, 2012, normal with normal ABI  ? Diabetes mellitus   ? type not specified  ? Drug intolerance   ? Patient is intolerant to both aspirin and Plavix.  ? Dyslipidemia   ? ED (erectile dysfunction)   ? Ejection fraction   ? EF 55%, echo, June, 2009, hypokinesis mid/distal septal wall, technically difficult study  ? Fibromyalgia   ? GERD (gastroesophageal reflux disease)   ? Glaucoma   ? right eye  ? H/O alcohol abuse   ? Alcohol abuse in the past  ? Hypertension   ? Polyneuropathy in diabetes(357.2) 10/06/2013  ? Psoriatic arthritis ([place])   ? Ringing in ear   ? June, 2012 /  carotid Dopplers, in July, 2012, 0-39% bilateral  ? S/P CABG x 1 09/18/2007  ? 2009  ? Stroke due to embolism of cerebral artery ([place]) 03/08/2015  ? Right MCA  ? Tobacco abuse   ? ? ? ?Allergies  ?Allergen Reactions  ? Clopidogrel Hives  ? Levofloxacin  Hives  ? Cymbalta [Duloxetine Hcl]   ?  Headache, shortness of breath  ? Paroxetine Hives  ? Terbinafine Hcl Hives  ? ? ? ?Current Outpatient Medications  ?Medication Sig Dispense Refill  ? albuterol (VENTOLIN HFA) 108 (90 Base) MCG/ACT inhaler Inhale 2 puffs into the lungs 4 (four) times daily as needed.    ? ALPRAZolam (XANAX) 1 MG tablet Take 1 mg by mouth 3 (three) times daily as needed.    ? amLODipine (NORVASC) 5 MG tablet Take 5 mg by mouth daily.    ? aspirin 81 MG tablet Take 81 mg by mouth daily.    ? atorvastatin (LIPITOR) 20 MG tablet Take 20 mg by mouth at bedtime.    ? benazepril (LOTENSIN) 20 MG tablet Take 20 mg by mouth daily.     ? betamethasone dipropionate (DIPROLENE) 0.05 % cream Apply 1  application topically daily.     ? Cholecalciferol (VITAMIN D3) 50 MCG (2000 UT) capsule Take 2,000 Units by mouth daily.    ? clobetasol (OLUX) 0.05 % topical foam Apply 1 application topically 2 (two) times daily.    ? diclofenac sodium (VOLTAREN) 1 % GEL Apply 2 g topically 3 (three) times daily.    ? dorzolamide-timolol (COSOPT) 22.3-6.8 MG/ML ophthalmic solution 1 drop 2 (two) times daily.    ? etanercept (ENBREL) 50 MG/ML injection Inject 50 mg into the skin once a week.    ? finasteride (PROSCAR) 5 MG tablet Take 1 tablet by mouth daily.    ? fluticasone (FLONASE) 50 MCG/ACT nasal spray Place 2 sprays into both nostrils daily.    ? gabapentin (NEURONTIN) 800 MG tablet Take 800 mg by mouth 4 (four) times daily.    ? glipiZIDE (GLUCOTROL) 5 MG tablet Take 5 mg by mouth 2 (two) times daily before a meal.     ? HYDROcodone-acetaminophen (NORCO) 10-325 MG tablet Take 1 tablet by mouth 4 (four) times daily.    ? hydrOXYzine (ATARAX/VISTARIL) 25 MG tablet Take 25 mg by mouth every 8 (eight) hours as needed.     ? ketoconazole (NIZORAL) 2 % cream Apply topically 2 (two) times daily. to affected area(s)    ? KRILL OIL PO Take 1 capsule by mouth daily.    ? lidocaine (XYLOCAINE) 5 % ointment APPLY TO AFFECTED AREA THREE TIMES DAILY AS NEEDED 30 g 1  ? metFORMIN (GLUCOPHAGE) 1000 MG tablet Take 1,000 mg by mouth 2 (two) times daily.    ? metoprolol tartrate (LOPRESSOR) 25 MG tablet Take 1 tablet (25 mg total) by mouth 2 (two) times daily. 180 tablet 1  ? Multiple Vitamin (MULTIVITAMIN) tablet Take 1 tablet by mouth daily.    ? nitroGLYCERIN (NITROSTAT) 0.4 MG SL tablet Place 1 tablet (0.4 mg total) under the tongue every 5 (five) minutes x 3 doses as needed for chest pain (if no relief after 2nd dose, proceed to the ED for an evaluation or call 911). 25 tablet 3  ? Omega-3 Fatty Acids (FISH OIL) 1000 MG CPDR Take 1 capsule by mouth 2 (two) times daily.    ? prednisoLONE acetate (PRED FORTE) 1 % ophthalmic suspension  Place 1 drop into the right eye daily.    ? QUEtiapine (SEROQUEL) 50 MG tablet Take 50 mg by mouth at bedtime as needed. Takes if someone is home with him    ? Sildenafil Citrate (VIAGRA PO) Take by mouth as needed.    ? silodosin (RAPAFLO) 8 MG CAPS capsule TAKE ONE CAPSULE  BY MOUTH DAILY WITH BREAKFAST 30 capsule 11  ? ?No current facility-administered medications for this visit.  ? ? ? ?Past Surgical History:  ?Procedure Laterality Date  ? CATARACT EXTRACTION Right   ? CORONARY ARTERY BYPASS GRAFT    ? CYSTOSCOPY WITH INSERTION OF UROLIFT N/A 12/04/2020  ? Procedure: CYSTOSCOPY WITH INSERTION OF UROLIFT;  Surgeon: Cleon Gustin, MD;  Location: [facility];  Service: Urology;  Laterality: N/A;  ? ? ? ?Allergies  ?Allergen Reactions  ? Clopidogrel Hives  ? Levofloxacin Hives  ? Cymbalta [Duloxetine Hcl]   ?  Headache, shortness of breath  ? Paroxetine Hives  ? Terbinafine Hcl Hives  ? ? ? ? ?Family History  ?Problem Relation Age of Onset  ? Dementia Mother   ? Diabetes Mother   ? Coronary artery disease Father   ? Heart attack Father   ? Cancer Brother   ? Coronary artery disease Other   ?     family hx  ? ? ? ?Social History ?Mr. Warriner reports that he has been smoking cigarettes. He started smoking about 60 years ago. He has a 22.50 pack-year smoking history. He has never used smokeless tobacco. ?Mr. Minter reports current alcohol use. ? ? ?Review of Systems ?CONSTITUTIONAL: No weight loss, fever, chills, weakness or fatigue.  ?HEENT: Eyes: No visual loss, blurred vision, double vision or yellow sclerae.No hearing loss, sneezing, congestion, runny nose or sore throat.  ?SKIN: No rash or itching.  ?CARDIOVASCULAR: per hpi ?RESPIRATORY: No shortness of breath, cough or sputum.  ?GASTROINTESTINAL: No anorexia, nausea, vomiting or diarrhea. No abdominal pain or blood.  ?GENITOURINARY: No burning on urination, no polyuria ?NEUROLOGICAL: No headache, dizziness, syncope, paralysis, ataxia, numbness or tingling in the  extremities. No change in bowel or bladder control.  ?MUSCULOSKELETAL: No muscle, back pain, joint pain or stiffness.  ?LYMPHATICS: No enlarged nodes. No history of splenectomy.  ?PSYCHIATRIC: No history of

## 2021-09-14 ENCOUNTER — Telehealth: Payer: Self-pay | Admitting: Cardiology

## 2021-09-14 NOTE — Telephone Encounter (Signed)
Pt c/o medication issue: ? ?1. Name of Medication:  ? metoprolol succinate (TOPROL XL) 25 MG 24 hr tablet  ? ? sacubitril-valsartan (ENTRESTO) 24-26 MG  ? ? ?2. How are you currently taking this medication (dosage and times per day)?  ? ?3. Are you having a reaction (difficulty breathing--STAT)? No - Ringing in ear has progressed ? ?4. What is your medication issue? Pt states that since starting these medications his BP has been elevated. Pt stated that he stopped taking both meds yesterday (4/27) and started back taking Amlodipine yesterday evening. Please advise ? ?

## 2021-09-14 NOTE — Telephone Encounter (Signed)
Mailbox full

## 2021-09-18 NOTE — Telephone Encounter (Signed)
Patient called back. I advised the patient that I would make the nurse aware that he was returning the call and to expect a call back. He states he did not get a call back and is a little frustrated. I advised the patient to be patient and give the nurse some time to call him back he voiced understanding.  ?

## 2021-09-18 NOTE — Telephone Encounter (Signed)
Patient stating that his BP became more elevated after making the medication changes at last OV.   ? ?BP readings - ? ?143/98  70 ?141/97  87 ?148/92  85 ?139/91  85 ?134/80  81 ? ?States that he stopped the Toprol & Entresto - off x 5-7 days.  Went back on his Amlodipine '5mg'$  daily after that.  And did start back with first dose of Entersto this morning.   ? ?States that he has tinnitus and says that when his BP or sugars are elevated - the ringing in his ears get worse.  ? ?BP today - 131/84  84 - feels a lot better now.   ? ?Does have OV on 10/12/2021 with Dr. Harl Bowie in [place] office.   ?

## 2021-09-20 NOTE — Telephone Encounter (Signed)
Needs to be on toprol and entresto to strengthen heart muscle, ok to be on these with norvasc for now. As we increase the entresto over time that will bring down the blood pressure and likely will need to come off norvasc again. For now would take all 3 toprol, entresto, norvasc ? ? ?Zandra Abts MD ?

## 2021-09-21 MED ORDER — AMLODIPINE BESYLATE 5 MG PO TABS: 5.0000 mg | ORAL_TABLET | Freq: Every day | ORAL | Status: DC

## 2021-09-21 NOTE — Telephone Encounter (Signed)
Patient notified and verbalized understanding. 

## 2021-10-12 ENCOUNTER — Ambulatory Visit (INDEPENDENT_AMBULATORY_CARE_PROVIDER_SITE_OTHER): Payer: Medicare Other | Admitting: Cardiology

## 2021-10-12 ENCOUNTER — Encounter: Payer: Self-pay | Admitting: Cardiology

## 2021-10-12 VITALS — BP 102/60 | HR 72 | Ht 70.0 in | Wt 181.2 lb

## 2021-10-12 DIAGNOSIS — I519 Heart disease, unspecified: Secondary | ICD-10-CM

## 2021-10-12 MED ORDER — SACUBITRIL-VALSARTAN 24-26 MG PO TABS: 1.0000 | ORAL_TABLET | Freq: Two times a day (BID) | ORAL | Status: DC

## 2021-10-12 MED ORDER — METOPROLOL SUCCINATE ER 25 MG PO TB24: 12.5000 mg | ORAL_TABLET | Freq: Every day | ORAL | 6 refills | Status: DC

## 2021-10-12 NOTE — Progress Notes (Signed)
Clinical Summary Eddie Reed is a 71 y.o.male seen today for a focused visit for history of systolic dysfunction     1. Systolic dysfunction - 09/8097 echo LVEF 40-45%, grade I dd - new diagnosis based on recent echo  - last visit changed to toprol 12.'5mg'$  daily, entresto 24/'26mg'$  bid. We had stopped his norvasc but he restarted due to high bp's and stopped taking toprol, entresto      Other medical problems not addressed this visit   1. CAD - history of prior CABG. Last cath 2009 as described below, patent grafts other than occluded SVG-marginal however there were collaterals and medically managed - reports allergy to plavix. Reports ASA upsets stomach. Recently back on ASA and having some burning feeling at times in epigastrium. Has tolerated combined with PPI.        01/2019 nuclear stress: prior scar, no current ischemia      - no significant edema - compliant with meds  3. HTN - home bp's 95-180s      Other medical issues not addressed this visit   2. Hyperlipidemia  - labs followed by his pcp - compliant with atorvastatin     3. DM2 - followed by pcp     4. History of CVA - presented with acute left sided weakness to [place]. Did not receive tPA,  transferred to [facility] - MRI of the brain without contrast on showed Multiple small acute/recent embolic type infarcts within the MCA territory of the right frontal and parietal lobes with no associated hemorrhage or mass effect - Echocardiogram with Doppler complete with bubble study was obtained on 02/18/15, showed no cardiac etiology for the stroke.Left ventricular ejection fraction was 55 %. - reports weakness has resolved.        Past Medical History:  Diagnosis Date   Allergy history, drug    plavix   Anxiety    Arm pain    continued discomfort in his arm- seeing Dr. Annie Main   Blind right eye    CAD (coronary artery disease)    catheterizzation. 11/10/07. patent LIMA to the LAD with  competitive filling, patent SVG to diagonal that enters a bifurcation point, occluded SVG to the distal marginal w/some collateralization, patent SVG to distal posterior lateral. medical therapy recommended.  EF 55% echo. June,2009. hypokinesis of the mid and distal septal wall. technically difficult study.   Carotid artery disease ([place])    Doppler, July, 2012, 0-39% bilateral, followup 2 years   Cellulitis    s/p right lower extremity cellulitis   Claudication ([place])    Arterial Dopplers, July, 2012, normal with normal ABI   Diabetes mellitus    type not specified   Drug intolerance    Patient is intolerant to both aspirin and Plavix.   Dyslipidemia    ED (erectile dysfunction)    Ejection fraction    EF 55%, echo, June, 2009, hypokinesis mid/distal septal wall, technically difficult study   Fibromyalgia    GERD (gastroesophageal reflux disease)    Glaucoma    right eye   H/O alcohol abuse    Alcohol abuse in the past   Hypertension    Polyneuropathy in diabetes(357.2) 10/06/2013   Psoriatic arthritis ([place])    Ringing in ear    June, 2012 /  carotid Dopplers, in July, 2012, 0-39% bilateral   S/P CABG x 1 09/18/2007   2009   Stroke due to embolism of cerebral artery ([place]) 03/08/2015   Right MCA  Tobacco abuse      Allergies  Allergen Reactions   Clopidogrel Hives   Levofloxacin Hives   Cymbalta [Duloxetine Hcl]     Headache, shortness of breath   Paroxetine Hives   Terbinafine Hcl Hives     Current Outpatient Medications  Medication Sig Dispense Refill   albuterol (VENTOLIN HFA) 108 (90 Base) MCG/ACT inhaler Inhale 2 puffs into the lungs 4 (four) times daily as needed.     ALPRAZolam (XANAX) 1 MG tablet Take 1 mg by mouth 3 (three) times daily as needed.     amLODipine (NORVASC) 5 MG tablet Take 1 tablet (5 mg total) by mouth daily.     aspirin 81 MG tablet Take 81 mg by mouth daily.     atorvastatin (LIPITOR) 20 MG tablet Take 20 mg by mouth at bedtime.      betamethasone dipropionate (DIPROLENE) 0.05 % cream Apply 1 application topically daily.      Cholecalciferol (VITAMIN D3) 50 MCG (2000 UT) capsule Take 2,000 Units by mouth daily.     clobetasol (OLUX) 0.05 % topical foam Apply 1 application topically 2 (two) times daily.     diclofenac sodium (VOLTAREN) 1 % GEL Apply 2 g topically 3 (three) times daily.     dorzolamide-timolol (COSOPT) 22.3-6.8 MG/ML ophthalmic solution 1 drop 2 (two) times daily.     etanercept (ENBREL) 50 MG/ML injection Inject 50 mg into the skin once a week.     finasteride (PROSCAR) 5 MG tablet Take 1 tablet by mouth daily.     fluticasone (FLONASE) 50 MCG/ACT nasal spray Place 2 sprays into both nostrils daily.     gabapentin (NEURONTIN) 800 MG tablet Take 800 mg by mouth 4 (four) times daily.     glipiZIDE (GLUCOTROL) 5 MG tablet Take 5 mg by mouth 2 (two) times daily before a meal.      HYDROcodone-acetaminophen (NORCO) 10-325 MG tablet Take 1 tablet by mouth 4 (four) times daily.     hydrOXYzine (ATARAX/VISTARIL) 25 MG tablet Take 25 mg by mouth every 8 (eight) hours as needed.      ketoconazole (NIZORAL) 2 % cream Apply topically 2 (two) times daily. to affected area(s)     KRILL OIL PO Take 1 capsule by mouth daily.     lidocaine (XYLOCAINE) 5 % ointment APPLY TO AFFECTED AREA THREE TIMES DAILY AS NEEDED 30 g 1   metFORMIN (GLUCOPHAGE) 1000 MG tablet Take 1,000 mg by mouth 2 (two) times daily.     metoprolol succinate (TOPROL XL) 25 MG 24 hr tablet Take 0.5 tablets (12.5 mg total) by mouth daily. 15 tablet 6   Multiple Vitamin (MULTIVITAMIN) tablet Take 1 tablet by mouth daily.     nitroGLYCERIN (NITROSTAT) 0.4 MG SL tablet Place 1 tablet (0.4 mg total) under the tongue every 5 (five) minutes x 3 doses as needed for chest pain (if no relief after 2nd dose, proceed to the ED for an evaluation or call 911). 25 tablet 3   Omega-3 Fatty Acids (FISH OIL) 1000 MG CPDR Take 1 capsule by mouth 2 (two) times daily.      prednisoLONE acetate (PRED FORTE) 1 % ophthalmic suspension Place 1 drop into the right eye daily.     QUEtiapine (SEROQUEL) 50 MG tablet Take 50 mg by mouth at bedtime as needed. Takes if someone is home with him     sacubitril-valsartan (ENTRESTO) 24-26 MG Take 1 tablet by mouth 2 (two) times daily. 60 tablet 6  Sildenafil Citrate (VIAGRA PO) Take by mouth as needed.     silodosin (RAPAFLO) 8 MG CAPS capsule TAKE ONE CAPSULE BY MOUTH DAILY WITH BREAKFAST 30 capsule 11   No current facility-administered medications for this visit.     Past Surgical History:  Procedure Laterality Date   CATARACT EXTRACTION Right    CORONARY ARTERY BYPASS GRAFT     CYSTOSCOPY WITH INSERTION OF UROLIFT N/A 12/04/2020   Procedure: CYSTOSCOPY WITH INSERTION OF UROLIFT;  Surgeon: Cleon Gustin, MD;  Location: [facility];  Service: Urology;  Laterality: N/A;     Allergies  Allergen Reactions   Clopidogrel Hives   Levofloxacin Hives   Cymbalta [Duloxetine Hcl]     Headache, shortness of breath   Paroxetine Hives   Terbinafine Hcl Hives      Family History  Problem Relation Age of Onset   Dementia Mother    Diabetes Mother    Coronary artery disease Father    Heart attack Father    Cancer Brother    Coronary artery disease Other        family hx     Social History Mr. Severt reports that he has been smoking cigarettes. He started smoking about 60 years ago. He has a 22.50 pack-year smoking history. He has never used smokeless tobacco. Mr. Campanelli reports current alcohol use.   Review of Systems CONSTITUTIONAL: No weight loss, fever, chills, weakness or fatigue.  HEENT: Eyes: No visual loss, blurred vision, double vision or yellow sclerae.No hearing loss, sneezing, congestion, runny nose or sore throat.  SKIN: No rash or itching.  CARDIOVASCULAR: per hpi RESPIRATORY: No shortness of breath, cough or sputum.  GASTROINTESTINAL: No anorexia, nausea, vomiting or diarrhea. No abdominal pain  or blood.  GENITOURINARY: No burning on urination, no polyuria NEUROLOGICAL: No headache, dizziness, syncope, paralysis, ataxia, numbness or tingling in the extremities. No change in bowel or bladder control.  MUSCULOSKELETAL: No muscle, back pain, joint pain or stiffness.  LYMPHATICS: No enlarged nodes. No history of splenectomy.  PSYCHIATRIC: No history of depression or anxiety.  ENDOCRINOLOGIC: No reports of sweating, cold or heat intolerance. No polyuria or polydipsia.  Marland Kitchen   Physical Examination Today's Vitals   10/12/21 1130  BP: 102/60  Pulse: 72  SpO2: 95%  Weight: 181 lb 3.2 oz (82.2 kg)  Height: '5\' 10"'$  (1.778 m)   Body mass index is 26 kg/m.  Gen: resting comfortably, no acute distress HEENT: no scleral icterus, pupils equal round and reactive, no palptable cervical adenopathy,  CV: RRR, no mr/g no jvd Resp: Clear to auscultation bilaterally GI: abdomen is soft, non-tender, non-distended, normal bowel sounds, no hepatosplenomegaly MSK: extremities are warm, no edema.  Skin: warm, no rash Neuro:  no focal deficits Psych: appropriate affect   Diagnostic Studies 09/2014 Carotid US Bilateral 1-39% carotid stenosis   10/2007 echo  SUMMARY  -  Overall left ventricular systolic function was normal. Left        ventricular ejection fraction was estimated to be 55 %.        Findings were suggestive of hypokinesis of the mid-distal        septal wall. Left ventricular wall thickness was mildly        increased.     10/2007 CathHEMODYNAMIC DATA:  Central aortic pressure was 106/73, mean 88.    ANGIOGRAPHIC DATA:  1. The left main was free of critical disease.  2. The LAD coursed to the apex.  The LAD has been stented  in the acute      setting prior to a surgery.  There is flow down the LAD and what      appears to be competitive flow after a septal perforator.  The      stent has mild luminal irregularities, but is intact.  It is a bare-      metal stent.  There is  competitive filling in the distal vessel.      Importantly, injection of the internal mammary reveals an internal      mammary that is patent.  There is perhaps a 30-40% area of bending      in the vessel, and the distal LAD does not fill through the      internal mammary nor does you see it well from the graft, although      there is clear evidence of the competitive filling of the distal      LAD territory.  The major diagonal Eddie Reed has 80% narrowing and      then 90% narrowing then bifurcates.  One bifurcation has      substantial disease and the graft appears to enter right just      beyond this or in the other Eddie Reed where there is evidence of clear-      cut competitive filling.  3. The saphenous vein graft to the diagonal Eddie Reed is widely patent.      There is slow runoff of the graft, but that is due to size      mismatch.  4. There is a small ramus intermedius that appears to be subtotally      occluded.  There is what appears to be competitive filling distally      and this is likely due to collateralization.  Likewise, the      circumflex is totally occluded and the OM graft is totally      occluded.  5. The right coronary artery has multiple lesions of 50% after a      stent.  There is a 30% lesion and some diffuse luminal      irregularities of 30-40% distally.  There is then an 80% stenosis      after a small PDA and then evidence of competitive filling      distally.  There is a vein graft to the PDA that appears to be      angiographically intact.    CONCLUSION:  1. Continued patency of the left internal mammary to the LAD, but with      evidence of what appears to be some competitive filling.  2. Patent saphenous vein graft to a diagonal that enters at a      bifurcation point.  3. Occluded saphenous vein graft to the distal marginal with at least      collateralization of what is likely the ramus and/or circumflex      itself.  4. Continued patency of the  saphenous vein graft to the distal      posterolateral segment.    Cath DISPOSITION:  At the present time, cardiac rehab and medical therapy  will be recommended.     02/2015 [facility] Carotid US IMPRESSION: Bilateral ASVD. No significant stenosis.      Stenosis is validated by velocity measurements with corresponding angiographic measurements and velocity criteria extrapolated in a process based on that defined by the Society of Radiologists in Ultrasound.     02/2015 [facility] Echo Interpretation Summary   A complete portable two-dimensional  transthoracic echocardiogram was   performed. The study was technically difficult. The left ventricle is   mildly dilated.   There is mild concentric left ventricular hypertrophy.   The aortic valve is trileaflet.   There is anterior wall hypokinesis.   Unable to adequately determine diastolic dysfunction.   The left atrium is mildly dilated.   The left ventricular ejection fraction is normal (50-55%).      Left Ventricle   The left ventricle is mildly dilated. There is mild concentric left   ventricular hypertrophy. There is anterior wall hypokinesis. The left   ventricular ejection fraction is normal (50-55%). Unable to adequately   determine diastolic dysfunction.         Right Ventricle   The right ventricle is not well visualized. The right ventricle is grossly   normal in size and function.      Atria   The left atrium is mildly dilated. The right atrium is normal. The IVC is   normal in size.      Mitral Valve   There is mild mitral annular calcification. There is no mitral   regurgitation.         Tricuspid Valve   The tricuspid valve is grossly normal. There is no tricuspid regurgitation.      Aortic Valve   The aortic valve opens well. The aortic valve is trileaflet. There is no   aortic regurgitation present.      Pulmonic Valve   The pulmonic valve is not well visualized.      Vessels   The aortic root is normal in  diameter.      Pericardium   There is no pericardial effusion.      MMode/2D Measurements & Calculations   IVSd: 1.3 cm                              LVIDd: 5.0 cm                                             LVIDs: 3.6 cm                                             LVPWd: 1.2 cm              _____________________________________________________________   LV mass(C)d: 257.0 grams                  Ao root diam: 3.7 cm      LV mass(C)dI: 120.6 grams/m2              Ao root area: 10.6 cm2                                             LA dimension: 4.1 cm      Doppler Measurements & Calculations   MV E max vel: 90.8 cm/sec                MV dec time: 0.26 sec   MV A max vel: 74.0 cm/sec   MV E/A: 1.2  06/2018 [facility] Screen Summary: Abdominal Aorta: No evidence of an abdominal aortic aneurysm was visualized. The largest aortic measurement is 2.8 cm.     01/2019 nuclear stress No diagnostic ST segment changes over baseline. No significant arrhythmias. Large, moderate to severe intensity, anteroseptal defect most prominent at the apex but extending to the base largely in the septal distribution. This region is fixed and consistent with prior infarct scar, no large ischemic territories. This is a high risk study based on infarct scar size and reduced LVEF. Nuclear stress EF: 36%.   08/2021 echo 1. Left ventricular ejection fraction, by estimation, is 40 to 45%. The  left ventricle has mildly decreased function. The left ventricle  demonstrates regional wall motion abnormalities (see scoring  diagram/findings for description). There is mild  asymmetric left ventricular hypertrophy of the septal segment. Left  ventricular diastolic parameters are consistent with Grade I diastolic  dysfunction (impaired relaxation).   2. Right ventricular systolic function is normal. The right ventricular  size is normal. Tricuspid regurgitation signal is inadequate for assessing  PA pressure.   3. The  mitral valve is grossly normal. Trivial mitral valve  regurgitation.   4. The aortic valve is tricuspid. Aortic valve regurgitation is not  visualized. No aortic stenosis is present. Aortic valve mean gradient  measures 3.0 mmHg.   5. Aortic dilatation noted. There is mild dilatation of the ascending  aorta, measuring 38 mm.   6. The inferior vena cava is normal in size with greater than 50%  respiratory variability, suggesting right atrial pressure of 3 mmHg.   Comparison(s): Prior images unable to be directly viewed.     Assessment and Plan      1. Systolic dysfunction - echo LVEF 40-45% - last visit we stopped norvasc and started toprol, entresto - high bp's initially, so he stopped toprol and entresto and restarted his norvasc.  - will add toprol 12.'5mg'$  daily for now, hold entresto, continue norvasc. Eventually would look to restart entresto and likely aldactone and farxiga in the near future - call us Monday update [place] on bp's and HRs           Eddie Reed, M.D

## 2021-10-12 NOTE — Patient Instructions (Addendum)
Medication Instructions:  Restart Toprol XL 12.'5mg'$  daily  May hold Entresto for now.   Continue all other medications.     Labwork: none  Testing/Procedures: none  Follow-Up: 3 months   Any Other Special Instructions Will Be Listed Below (If Applicable). Please call the office or mychart message the office on Tuesday to update [place] on your blood pressures and heart rates.    If you need a refill on your cardiac medications before your next appointment, please call your pharmacy.

## 2022-01-14 ENCOUNTER — Ambulatory Visit: Payer: Medicare Other | Admitting: Cardiology

## 2022-01-14 NOTE — Progress Notes (Deleted)
Clinical Summary Eddie Reed is a 71 y.o.male  seen today for a focused visit for history of systolic dysfunction       1. Systolic dysfunction - 09/930 echo LVEF 40-45%, grade I dd - new diagnosis based on recent echo   - last visit changed to toprol 12.'5mg'$  daily, entresto 24/'26mg'$  bid. We had stopped his norvasc but he restarted due to high bp's and stopped taking toprol, entresto           Other medical problems not addressed this visit     1. CAD - history of prior CABG. Last cath 2009 as described below, patent grafts other than occluded SVG-marginal however there were collaterals and medically managed - reports allergy to plavix. Reports ASA upsets stomach. Recently back on ASA and having some burning feeling at times in epigastrium. Has tolerated combined with PPI.        01/2019 nuclear stress: prior scar, no current ischemia      - no significant edema - compliant with meds   3. HTN - home bp's 95-180s       Other medical issues not addressed this visit   2. Hyperlipidemia  - labs followed by his pcp - compliant with atorvastatin     3. DM2 - followed by pcp     4. History of CVA - presented with acute left sided weakness to [place]. Did not receive tPA,  transferred to [facility] - MRI of the brain without contrast on showed Multiple small acute/recent embolic type infarcts within the MCA territory of the right frontal and parietal lobes with no associated hemorrhage or mass effect - Echocardiogram with Doppler complete with bubble study was obtained on 02/18/15, showed no cardiac etiology for the stroke.Left ventricular ejection fraction was 55 %. - reports weakness has resolved.        Past Medical History:  Diagnosis Date   Allergy history, drug    plavix   Anxiety    Arm pain    continued discomfort in his arm- seeing Dr. Annie Main   Blind right eye    CAD (coronary artery disease)    catheterizzation. 11/10/07. patent LIMA to  the LAD with competitive filling, patent SVG to diagonal that enters a bifurcation point, occluded SVG to the distal marginal w/some collateralization, patent SVG to distal posterior lateral. medical therapy recommended.  EF 55% echo. June,2009. hypokinesis of the mid and distal septal wall. technically difficult study.   Carotid artery disease ([place])    Doppler, July, 2012, 0-39% bilateral, followup 2 years   Cellulitis    s/p right lower extremity cellulitis   Claudication ([place])    Arterial Dopplers, July, 2012, normal with normal ABI   Diabetes mellitus    type not specified   Drug intolerance    Patient is intolerant to both aspirin and Plavix.   Dyslipidemia    ED (erectile dysfunction)    Ejection fraction    EF 55%, echo, June, 2009, hypokinesis mid/distal septal wall, technically difficult study   Fibromyalgia    GERD (gastroesophageal reflux disease)    Glaucoma    right eye   H/O alcohol abuse    Alcohol abuse in the past   Hypertension    Polyneuropathy in diabetes(357.2) 10/06/2013   Psoriatic arthritis ([place])    Ringing in ear    June, 2012 /  carotid Dopplers, in July, 2012, 0-39% bilateral   S/P CABG x 1 09/18/2007   2009  Stroke due to embolism of cerebral artery (HCC) 03/08/2015   Right MCA   Tobacco abuse      Allergies  Allergen Reactions   Clopidogrel Hives   Levofloxacin Hives   Cymbalta [Duloxetine Hcl]     Headache, shortness of breath   Paroxetine Hives   Terbinafine Hcl Hives     Current Outpatient Medications  Medication Sig Dispense Refill   albuterol (VENTOLIN HFA) 108 (90 Base) MCG/ACT inhaler Inhale 2 puffs into the lungs 4 (four) times daily as needed.     ALPRAZolam (XANAX) 1 MG tablet Take 1 mg by mouth 3 (three) times daily as needed.     amLODipine (NORVASC) 5 MG tablet Take 1 tablet (5 mg total) by mouth daily.     aspirin 81 MG tablet Take 81 mg by mouth daily.     atorvastatin (LIPITOR) 20 MG tablet Take 20 mg by mouth at  bedtime.     Cholecalciferol (VITAMIN D3) 50 MCG (2000 UT) capsule Take 2,000 Units by mouth daily.     clobetasol (OLUX) 0.05 % topical foam Apply 1 application topically 2 (two) times daily.     diclofenac sodium (VOLTAREN) 1 % GEL Apply 2 g topically 3 (three) times daily.     dorzolamide-timolol (COSOPT) 22.3-6.8 MG/ML ophthalmic solution 1 drop 2 (two) times daily.     etanercept (ENBREL) 50 MG/ML injection Inject 50 mg into the skin once a week.     finasteride (PROSCAR) 5 MG tablet Take 1 tablet by mouth daily.     fluticasone (FLONASE) 50 MCG/ACT nasal spray Place 2 sprays into both nostrils daily.     gabapentin (NEURONTIN) 800 MG tablet Take 800 mg by mouth 4 (four) times daily.     glipiZIDE (GLUCOTROL) 5 MG tablet Take 5 mg by mouth 2 (two) times daily before a meal.      HYDROcodone-acetaminophen (NORCO) 10-325 MG tablet Take 1 tablet by mouth 4 (four) times daily.     hydrOXYzine (ATARAX/VISTARIL) 25 MG tablet Take 25 mg by mouth every 8 (eight) hours as needed.      ketoconazole (NIZORAL) 2 % cream Apply topically 2 (two) times daily. to affected area(s)     KRILL OIL PO Take 1 capsule by mouth daily.     lidocaine (XYLOCAINE) 5 % ointment APPLY TO AFFECTED AREA THREE TIMES DAILY AS NEEDED 30 g 1   metFORMIN (GLUCOPHAGE) 1000 MG tablet Take 1,000 mg by mouth 2 (two) times daily.     metoprolol succinate (TOPROL XL) 25 MG 24 hr tablet Take 0.5 tablets (12.5 mg total) by mouth daily. 15 tablet 6   Multiple Vitamin (MULTIVITAMIN) tablet Take 1 tablet by mouth daily.     nitroGLYCERIN (NITROSTAT) 0.4 MG SL tablet Place 1 tablet (0.4 mg total) under the tongue every 5 (five) minutes x 3 doses as needed for chest pain (if no relief after 2nd dose, proceed to the ED for an evaluation or call 911). 25 tablet 3   Omega-3 Fatty Acids (FISH OIL) 1000 MG CPDR Take 1 capsule by mouth 2 (two) times daily.     prednisoLONE acetate (PRED FORTE) 1 % ophthalmic suspension Place 1 drop into the right  eye daily.     QUEtiapine (SEROQUEL) 50 MG tablet Take 50 mg by mouth at bedtime as needed. Takes if someone is home with him     sacubitril-valsartan (ENTRESTO) 24-26 MG Take 1 tablet by mouth 2 (two) times daily. (10/12/2021 - HOLDING CURRENTLY)  Sildenafil Citrate (VIAGRA PO) Take by mouth as needed.     silodosin (RAPAFLO) 8 MG CAPS capsule TAKE ONE CAPSULE BY MOUTH DAILY WITH BREAKFAST 30 capsule 11   No current facility-administered medications for this visit.     Past Surgical History:  Procedure Laterality Date   CATARACT EXTRACTION Right    CORONARY ARTERY BYPASS GRAFT     CYSTOSCOPY WITH INSERTION OF UROLIFT N/A 12/04/2020   Procedure: CYSTOSCOPY WITH INSERTION OF UROLIFT;  Surgeon: Cleon Gustin, MD;  Location: [facility];  Service: Urology;  Laterality: N/A;     Allergies  Allergen Reactions   Clopidogrel Hives   Levofloxacin Hives   Cymbalta [Duloxetine Hcl]     Headache, shortness of breath   Paroxetine Hives   Terbinafine Hcl Hives      Family History  Problem Relation Age of Onset   Dementia Mother    Diabetes Mother    Coronary artery disease Father    Heart attack Father    Cancer Brother    Coronary artery disease Other        family hx     Social History Mr. Montagna reports that he has been smoking cigarettes. He started smoking about 60 years ago. He has a 22.50 pack-year smoking history. He has never used smokeless tobacco. Mr. Hippler reports current alcohol use.   Review of Systems CONSTITUTIONAL: No weight loss, fever, chills, weakness or fatigue.  HEENT: Eyes: No visual loss, blurred vision, double vision or yellow sclerae.No hearing loss, sneezing, congestion, runny nose or sore throat.  SKIN: No rash or itching.  CARDIOVASCULAR:  RESPIRATORY: No shortness of breath, cough or sputum.  GASTROINTESTINAL: No anorexia, nausea, vomiting or diarrhea. No abdominal pain or blood.  GENITOURINARY: No burning on urination, no  polyuria NEUROLOGICAL: No headache, dizziness, syncope, paralysis, ataxia, numbness or tingling in the extremities. No change in bowel or bladder control.  MUSCULOSKELETAL: No muscle, back pain, joint pain or stiffness.  LYMPHATICS: No enlarged nodes. No history of splenectomy.  PSYCHIATRIC: No history of depression or anxiety.  ENDOCRINOLOGIC: No reports of sweating, cold or heat intolerance. No polyuria or polydipsia.  Marland Kitchen   Physical Examination There were no vitals filed for this visit. There were no vitals filed for this visit.  Gen: resting comfortably, no acute distress HEENT: no scleral icterus, pupils equal round and reactive, no palptable cervical adenopathy,  CV Resp: Clear to auscultation bilaterally GI: abdomen is soft, non-tender, non-distended, normal bowel sounds, no hepatosplenomegaly MSK: extremities are warm, no edema.  Skin: warm, no rash Neuro:  no focal deficits Psych: appropriate affect   Diagnostic Studies 09/2014 Carotid US Bilateral 1-39% carotid stenosis   10/2007 echo  SUMMARY  -  Overall left ventricular systolic function was normal. Left        ventricular ejection fraction was estimated to be 55 %.        Findings were suggestive of hypokinesis of the mid-distal        septal wall. Left ventricular wall thickness was mildly        increased.     10/2007 CathHEMODYNAMIC DATA:  Central aortic pressure was 106/73, mean 88.    ANGIOGRAPHIC DATA:  1. The left main was free of critical disease.  2. The LAD coursed to the apex.  The LAD has been stented in the acute      setting prior to a surgery.  There is flow down the LAD and what  appears to be competitive flow after a septal perforator.  The      stent has mild luminal irregularities, but is intact.  It is a bare-      metal stent.  There is competitive filling in the distal vessel.      Importantly, injection of the internal mammary reveals an internal      mammary that is patent.  There is  perhaps a 30-40% area of bending      in the vessel, and the distal LAD does not fill through the      internal mammary nor does you see it well from the graft, although      there is clear evidence of the competitive filling of the distal      LAD territory.  The major diagonal Maeven Mcdougall has 80% narrowing and      then 90% narrowing then bifurcates.  One bifurcation has      substantial disease and the graft appears to enter right just      beyond this or in the other Symir Mah where there is evidence of clear-      cut competitive filling.  3. The saphenous vein graft to the diagonal Ireoluwa Grant is widely patent.      There is slow runoff of the graft, but that is due to size      mismatch.  4. There is a small ramus intermedius that appears to be subtotally      occluded.  There is what appears to be competitive filling distally      and this is likely due to collateralization.  Likewise, the      circumflex is totally occluded and the OM graft is totally      occluded.  5. The right coronary artery has multiple lesions of 50% after a      stent.  There is a 30% lesion and some diffuse luminal      irregularities of 30-40% distally.  There is then an 80% stenosis      after a small PDA and then evidence of competitive filling      distally.  There is a vein graft to the PDA that appears to be      angiographically intact.    CONCLUSION:  1. Continued patency of the left internal mammary to the LAD, but with      evidence of what appears to be some competitive filling.  2. Patent saphenous vein graft to a diagonal that enters at a      bifurcation point.  3. Occluded saphenous vein graft to the distal marginal with at least      collateralization of what is likely the ramus and/or circumflex      itself.  4. Continued patency of the saphenous vein graft to the distal      posterolateral segment.    Cath DISPOSITION:  At the present time, cardiac rehab and medical therapy  will be  recommended.     02/2015 [facility] Carotid US IMPRESSION: Bilateral ASVD. No significant stenosis.      Stenosis is validated by velocity measurements with corresponding angiographic measurements and velocity criteria extrapolated in a process based on that defined by the Society of Radiologists in Ultrasound.     02/2015 [facility] Echo Interpretation Summary   A complete portable two-dimensional transthoracic echocardiogram was   performed. The study was technically difficult. The left ventricle is   mildly dilated.   There is mild concentric left ventricular  hypertrophy.   The aortic valve is trileaflet.   There is anterior wall hypokinesis.   Unable to adequately determine diastolic dysfunction.   The left atrium is mildly dilated.   The left ventricular ejection fraction is normal (50-55%).      Left Ventricle   The left ventricle is mildly dilated. There is mild concentric left   ventricular hypertrophy. There is anterior wall hypokinesis. The left   ventricular ejection fraction is normal (50-55%). Unable to adequately   determine diastolic dysfunction.         Right Ventricle   The right ventricle is not well visualized. The right ventricle is grossly   normal in size and function.      Atria   The left atrium is mildly dilated. The right atrium is normal. The IVC is   normal in size.      Mitral Valve   There is mild mitral annular calcification. There is no mitral   regurgitation.         Tricuspid Valve   The tricuspid valve is grossly normal. There is no tricuspid regurgitation.      Aortic Valve   The aortic valve opens well. The aortic valve is trileaflet. There is no   aortic regurgitation present.      Pulmonic Valve   The pulmonic valve is not well visualized.      Vessels   The aortic root is normal in diameter.      Pericardium   There is no pericardial effusion.      MMode/2D Measurements & Calculations   IVSd: 1.3 cm                               LVIDd: 5.0 cm                                             LVIDs: 3.6 cm                                             LVPWd: 1.2 cm              _____________________________________________________________   LV mass(C)d: 257.0 grams                  Ao root diam: 3.7 cm      LV mass(C)dI: 120.6 grams/m2              Ao root area: 10.6 cm2                                             LA dimension: 4.1 cm      Doppler Measurements & Calculations   MV E max vel: 90.8 cm/sec                MV dec time: 0.26 sec   MV A max vel: 74.0 cm/sec   MV E/A: 1.2       06/2018 [facility] Screen Summary: Abdominal Aorta: No evidence of an abdominal aortic aneurysm was visualized. The largest aortic measurement is 2.8 cm.  01/2019 nuclear stress No diagnostic ST segment changes over baseline. No significant arrhythmias. Large, moderate to severe intensity, anteroseptal defect most prominent at the apex but extending to the base largely in the septal distribution. This region is fixed and consistent with prior infarct scar, no large ischemic territories. This is a high risk study based on infarct scar size and reduced LVEF. Nuclear stress EF: 36%.   08/2021 echo 1. Left ventricular ejection fraction, by estimation, is 40 to 45%. The  left ventricle has mildly decreased function. The left ventricle  demonstrates regional wall motion abnormalities (see scoring  diagram/findings for description). There is mild  asymmetric left ventricular hypertrophy of the septal segment. Left  ventricular diastolic parameters are consistent with Grade I diastolic  dysfunction (impaired relaxation).   2. Right ventricular systolic function is normal. The right ventricular  size is normal. Tricuspid regurgitation signal is inadequate for assessing  PA pressure.   3. The mitral valve is grossly normal. Trivial mitral valve  regurgitation.   4. The aortic valve is tricuspid. Aortic valve regurgitation is not  visualized.  No aortic stenosis is present. Aortic valve mean gradient  measures 3.0 mmHg.   5. Aortic dilatation noted. There is mild dilatation of the ascending  aorta, measuring 38 mm.   6. The inferior vena cava is normal in size with greater than 50%  respiratory variability, suggesting right atrial pressure of 3 mmHg.   Comparison(s): Prior images unable to be directly viewed.     Assessment and Plan   1. Systolic dysfunction - echo LVEF 40-45% - last visit we stopped norvasc and started toprol, entresto - high bp's initially, so he stopped toprol and entresto and restarted his norvasc.  - will add toprol 12.'5mg'$  daily for now, hold entresto, continue norvasc. Eventually would look to restart entresto and likely aldactone and farxiga in the near future - call us Monday update [place] on bp's and HRs       Arnoldo Lenis, M.D.

## 2022-03-06 ENCOUNTER — Other Ambulatory Visit: Payer: Self-pay | Admitting: Cardiology

## 2022-04-04 ENCOUNTER — Other Ambulatory Visit: Payer: Self-pay | Admitting: Cardiology

## 2022-05-05 ENCOUNTER — Other Ambulatory Visit: Payer: Self-pay | Admitting: Cardiology

## 2022-05-27 DIAGNOSIS — E1340 Other specified diabetes mellitus with diabetic neuropathy, unspecified: Secondary | ICD-10-CM | POA: Diagnosis not present

## 2022-05-27 DIAGNOSIS — M797 Fibromyalgia: Secondary | ICD-10-CM | POA: Diagnosis not present

## 2022-05-27 DIAGNOSIS — E1165 Type 2 diabetes mellitus with hyperglycemia: Secondary | ICD-10-CM | POA: Diagnosis not present

## 2022-05-27 DIAGNOSIS — I10 Essential (primary) hypertension: Secondary | ICD-10-CM | POA: Diagnosis not present

## 2022-05-27 DIAGNOSIS — I700 Atherosclerosis of aorta: Secondary | ICD-10-CM | POA: Diagnosis not present

## 2022-05-27 DIAGNOSIS — M545 Low back pain, unspecified: Secondary | ICD-10-CM | POA: Diagnosis not present

## 2022-05-27 DIAGNOSIS — E7849 Other hyperlipidemia: Secondary | ICD-10-CM | POA: Diagnosis not present

## 2022-05-27 DIAGNOSIS — Z720 Tobacco use: Secondary | ICD-10-CM | POA: Diagnosis not present

## 2022-05-27 DIAGNOSIS — K219 Gastro-esophageal reflux disease without esophagitis: Secondary | ICD-10-CM | POA: Diagnosis not present

## 2022-05-27 DIAGNOSIS — L4050 Arthropathic psoriasis, unspecified: Secondary | ICD-10-CM | POA: Diagnosis not present

## 2022-06-12 DIAGNOSIS — J209 Acute bronchitis, unspecified: Secondary | ICD-10-CM | POA: Diagnosis not present

## 2022-06-12 DIAGNOSIS — J019 Acute sinusitis, unspecified: Secondary | ICD-10-CM | POA: Diagnosis not present

## 2022-06-12 DIAGNOSIS — F17210 Nicotine dependence, cigarettes, uncomplicated: Secondary | ICD-10-CM | POA: Diagnosis not present

## 2022-06-12 DIAGNOSIS — R030 Elevated blood-pressure reading, without diagnosis of hypertension: Secondary | ICD-10-CM | POA: Diagnosis not present

## 2022-06-19 ENCOUNTER — Other Ambulatory Visit: Payer: Self-pay | Admitting: Cardiology

## 2022-06-25 ENCOUNTER — Other Ambulatory Visit: Payer: Self-pay | Admitting: Cardiology

## 2022-07-04 DIAGNOSIS — E1140 Type 2 diabetes mellitus with diabetic neuropathy, unspecified: Secondary | ICD-10-CM | POA: Diagnosis not present

## 2022-07-04 DIAGNOSIS — E1151 Type 2 diabetes mellitus with diabetic peripheral angiopathy without gangrene: Secondary | ICD-10-CM | POA: Diagnosis not present

## 2022-07-04 DIAGNOSIS — M79672 Pain in left foot: Secondary | ICD-10-CM | POA: Diagnosis not present

## 2022-07-04 DIAGNOSIS — M79671 Pain in right foot: Secondary | ICD-10-CM | POA: Diagnosis not present

## 2022-07-04 DIAGNOSIS — G5790 Unspecified mononeuropathy of unspecified lower limb: Secondary | ICD-10-CM | POA: Diagnosis not present

## 2022-08-07 ENCOUNTER — Other Ambulatory Visit: Payer: Self-pay | Admitting: Urology

## 2022-08-07 DIAGNOSIS — R339 Retention of urine, unspecified: Secondary | ICD-10-CM

## 2022-09-03 ENCOUNTER — Telehealth: Payer: Self-pay | Admitting: Cardiology

## 2022-09-03 NOTE — Telephone Encounter (Signed)
Patient states that he has been seen at [facility] but due to a bad experience, does not want to go back. Gave patient number to [facility]. States that he reach out to that office for appointment.

## 2022-09-03 NOTE — Telephone Encounter (Signed)
Pt would like a callback in regards to getting assistance with finding another Ortho provider, I did let me know that's something he could ask his pcp since this is card but he just wanted the help and a few suggests if at all possible. Please advise.

## 2022-09-08 ENCOUNTER — Other Ambulatory Visit: Payer: Self-pay | Admitting: Cardiology

## 2022-10-01 ENCOUNTER — Ambulatory Visit: Payer: Medicare Other | Admitting: Cardiology

## 2022-10-01 NOTE — Progress Notes (Deleted)
Clinical Summary Mr. Kiessling is a 72 y.o.male  seen today for a focused visit for history of systolic dysfunction       1. Systolic dysfunction - 08/2021 echo LVEF 40-45%, grade I dd - new diagnosis based on recent echo   - last visit changed to toprol 12.5mg  daily, entresto 24/26mg  bid. We had stopped his norvasc but he restarted due to high bp's and stopped taking toprol, entresto           Other medical problems not addressed this visit     1. CAD - history of prior CABG. Last cath 2009 as described below, patent grafts other than occluded SVG-marginal however there were collaterals and medically managed - reports allergy to plavix. Reports ASA upsets stomach. Recently back on ASA and having some burning feeling at times in epigastrium. Has tolerated combined with PPI.        01/2019 nuclear stress: prior scar, no current ischemia      - no significant edema - compliant with meds   3. HTN - home bp's 95-180s       Other medical issues not addressed this visit   2. Hyperlipidemia  - labs followed by his pcp - compliant with atorvastatin     3. DM2 - followed by pcp     4. History of CVA - presented with acute left sided weakness to [place]. Did not receive tPA,  transferred to [facility] - MRI of the brain without contrast on showed Multiple small acute/recent embolic type infarcts within the MCA territory of the right frontal and parietal lobes with no associated hemorrhage or mass effect - Echocardiogram with Doppler complete with bubble study was obtained on 02/18/15, showed no cardiac etiology for the stroke.Left ventricular ejection fraction was 55 %. - reports weakness has resolved.  Past Medical History:  Diagnosis Date   Allergy history, drug    plavix   Anxiety    Arm pain    continued discomfort in his arm- seeing Dr. Cliffton Asters   Blind right eye    CAD (coronary artery disease)    catheterizzation. 11/10/07. patent LIMA to the LAD  with competitive filling, patent SVG to diagonal that enters a bifurcation point, occluded SVG to the distal marginal w/some collateralization, patent SVG to distal posterior lateral. medical therapy recommended.  EF 55% echo. June,2009. hypokinesis of the mid and distal septal wall. technically difficult study.   Carotid artery disease (HCC)    Doppler, July, 2012, 0-39% bilateral, followup 2 years   Cellulitis    s/p right lower extremity cellulitis   Claudication (HCC)    Arterial Dopplers, July, 2012, normal with normal ABI   Diabetes mellitus    type not specified   Drug intolerance    Patient is intolerant to both aspirin and Plavix.   Dyslipidemia    ED (erectile dysfunction)    Ejection fraction    EF 55%, echo, June, 2009, hypokinesis mid/distal septal wall, technically difficult study   Fibromyalgia    GERD (gastroesophageal reflux disease)    Glaucoma    right eye   H/O alcohol abuse    Alcohol abuse in the past   Hypertension    Polyneuropathy in diabetes(357.2) 10/06/2013   Psoriatic arthritis (HCC)    Ringing in ear    June, 2012 /  carotid Dopplers, in July, 2012, 0-39% bilateral   S/P CABG x 1 09/18/2007   2009   Stroke due to embolism of cerebral  artery (HCC) 03/08/2015   Right MCA   Tobacco abuse      Allergies  Allergen Reactions   Clopidogrel Hives   Levofloxacin Hives   Cymbalta [Duloxetine Hcl]     Headache, shortness of breath   Paroxetine Hives   Terbinafine Hcl Hives     Current Outpatient Medications  Medication Sig Dispense Refill   albuterol (VENTOLIN HFA) 108 (90 Base) MCG/ACT inhaler Inhale 2 puffs into the lungs 4 (four) times daily as needed.     ALPRAZolam (XANAX) 1 MG tablet Take 1 mg by mouth 3 (three) times daily as needed.     amLODipine (NORVASC) 5 MG tablet Take 1 tablet (5 mg total) by mouth daily.     aspirin 81 MG tablet Take 81 mg by mouth daily.     atorvastatin (LIPITOR) 20 MG tablet Take 20 mg by mouth at bedtime.      Cholecalciferol (VITAMIN D3) 50 MCG (2000 UT) capsule Take 2,000 Units by mouth daily.     clobetasol (OLUX) 0.05 % topical foam Apply 1 application topically 2 (two) times daily.     diclofenac sodium (VOLTAREN) 1 % GEL Apply 2 g topically 3 (three) times daily.     dorzolamide-timolol (COSOPT) 22.3-6.8 MG/ML ophthalmic solution 1 drop 2 (two) times daily.     etanercept (ENBREL) 50 MG/ML injection Inject 50 mg into the skin once a week.     finasteride (PROSCAR) 5 MG tablet Take 1 tablet by mouth daily.     fluticasone (FLONASE) 50 MCG/ACT nasal spray Place 2 sprays into both nostrils daily.     gabapentin (NEURONTIN) 800 MG tablet Take 800 mg by mouth 4 (four) times daily.     glipiZIDE (GLUCOTROL) 5 MG tablet Take 5 mg by mouth 2 (two) times daily before a meal.      HYDROcodone-acetaminophen (NORCO) 10-325 MG tablet Take 1 tablet by mouth 4 (four) times daily.     hydrOXYzine (ATARAX/VISTARIL) 25 MG tablet Take 25 mg by mouth every 8 (eight) hours as needed.      ketoconazole (NIZORAL) 2 % cream Apply topically 2 (two) times daily. to affected area(s)     KRILL OIL PO Take 1 capsule by mouth daily.     lidocaine (XYLOCAINE) 5 % ointment APPLY TO AFFECTED AREA THREE TIMES DAILY AS NEEDED 30 g 1   metFORMIN (GLUCOPHAGE) 1000 MG tablet Take 1,000 mg by mouth 2 (two) times daily.     metoprolol succinate (TOPROL-XL) 25 MG 24 hr tablet TAKE 1/2 TABLET BY MOUTH EVERY DAY 15 tablet 6   Multiple Vitamin (MULTIVITAMIN) tablet Take 1 tablet by mouth daily.     nitroGLYCERIN (NITROSTAT) 0.4 MG SL tablet Place 1 tablet (0.4 mg total) under the tongue every 5 (five) minutes x 3 doses as needed for chest pain (if no relief after 2nd dose, proceed to the ED for an evaluation or call 911). 25 tablet 3   Omega-3 Fatty Acids (FISH OIL) 1000 MG CPDR Take 1 capsule by mouth 2 (two) times daily.     prednisoLONE acetate (PRED FORTE) 1 % ophthalmic suspension Place 1 drop into the right eye daily.     QUEtiapine  (SEROQUEL) 50 MG tablet Take 50 mg by mouth at bedtime as needed. Takes if someone is home with him     sacubitril-valsartan (ENTRESTO) 24-26 MG TAKE 1 TABLET BY MOUTH TWICE DAILY 60 tablet 1   Sildenafil Citrate (VIAGRA PO) Take by mouth as needed.  silodosin (RAPAFLO) 8 MG CAPS capsule TAKE ONE CAPSULE BY MOUTH DAILY WITH BREAKFAST 30 capsule 11   No current facility-administered medications for this visit.     Past Surgical History:  Procedure Laterality Date   CATARACT EXTRACTION Right    CORONARY ARTERY BYPASS GRAFT     CYSTOSCOPY WITH INSERTION OF UROLIFT N/A 12/04/2020   Procedure: CYSTOSCOPY WITH INSERTION OF UROLIFT;  Surgeon: Malen Gauze, MD;  Location: [facility];  Service: Urology;  Laterality: N/A;     Allergies  Allergen Reactions   Clopidogrel Hives   Levofloxacin Hives   Cymbalta [Duloxetine Hcl]     Headache, shortness of breath   Paroxetine Hives   Terbinafine Hcl Hives      Family History  Problem Relation Age of Onset   Dementia Mother    Diabetes Mother    Coronary artery disease Father    Heart attack Father    Cancer Brother    Coronary artery disease Other        family hx     Social History Mr. Bruney reports that he has been smoking cigarettes. He started smoking about 61 years ago. He has a 22.50 pack-year smoking history. He has never used smokeless tobacco. Mr. Hannel reports current alcohol use.   Review of Systems CONSTITUTIONAL: No weight loss, fever, chills, weakness or fatigue.  HEENT: Eyes: No visual loss, blurred vision, double vision or yellow sclerae.No hearing loss, sneezing, congestion, runny nose or sore throat.  SKIN: No rash or itching.  CARDIOVASCULAR:  RESPIRATORY: No shortness of breath, cough or sputum.  GASTROINTESTINAL: No anorexia, nausea, vomiting or diarrhea. No abdominal pain or blood.  GENITOURINARY: No burning on urination, no polyuria NEUROLOGICAL: No headache, dizziness, syncope, paralysis, ataxia,  numbness or tingling in the extremities. No change in bowel or bladder control.  MUSCULOSKELETAL: No muscle, back pain, joint pain or stiffness.  LYMPHATICS: No enlarged nodes. No history of splenectomy.  PSYCHIATRIC: No history of depression or anxiety.  ENDOCRINOLOGIC: No reports of sweating, cold or heat intolerance. No polyuria or polydipsia.  Marland Kitchen   Physical Examination There were no vitals filed for this visit. There were no vitals filed for this visit.  Gen: resting comfortably, no acute distress HEENT: no scleral icterus, pupils equal round and reactive, no palptable cervical adenopathy,  CV Resp: Clear to auscultation bilaterally GI: abdomen is soft, non-tender, non-distended, normal bowel sounds, no hepatosplenomegaly MSK: extremities are warm, no edema.  Skin: warm, no rash Neuro:  no focal deficits Psych: appropriate affect   Diagnostic Studies  09/2014 Carotid US Bilateral 1-39% carotid stenosis   10/2007 echo  SUMMARY  -  Overall left ventricular systolic function was normal. Left        ventricular ejection fraction was estimated to be 55 %.        Findings were suggestive of hypokinesis of the mid-distal        septal wall. Left ventricular wall thickness was mildly        increased.     10/2007 CathHEMODYNAMIC DATA:  Central aortic pressure was 106/73, mean 88.    ANGIOGRAPHIC DATA:  1. The left main was free of critical disease.  2. The LAD coursed to the apex.  The LAD has been stented in the acute      setting prior to a surgery.  There is flow down the LAD and what      appears to be competitive flow after a septal perforator.  The  stent has mild luminal irregularities, but is intact.  It is a bare-      metal stent.  There is competitive filling in the distal vessel.      Importantly, injection of the internal mammary reveals an internal      mammary that is patent.  There is perhaps a 30-40% area of bending      in the vessel, and the distal LAD  does not fill through the      internal mammary nor does you see it well from the graft, although      there is clear evidence of the competitive filling of the distal      LAD territory.  The major diagonal Chameka Mcmullen has 80% narrowing and      then 90% narrowing then bifurcates.  One bifurcation has      substantial disease and the graft appears to enter right just      beyond this or in the other Kristol Almanzar where there is evidence of clear-      cut competitive filling.  3. The saphenous vein graft to the diagonal Declan Mier is widely patent.      There is slow runoff of the graft, but that is due to size      mismatch.  4. There is a small ramus intermedius that appears to be subtotally      occluded.  There is what appears to be competitive filling distally      and this is likely due to collateralization.  Likewise, the      circumflex is totally occluded and the OM graft is totally      occluded.  5. The right coronary artery has multiple lesions of 50% after a      stent.  There is a 30% lesion and some diffuse luminal      irregularities of 30-40% distally.  There is then an 80% stenosis      after a small PDA and then evidence of competitive filling      distally.  There is a vein graft to the PDA that appears to be      angiographically intact.    CONCLUSION:  1. Continued patency of the left internal mammary to the LAD, but with      evidence of what appears to be some competitive filling.  2. Patent saphenous vein graft to a diagonal that enters at a      bifurcation point.  3. Occluded saphenous vein graft to the distal marginal with at least      collateralization of what is likely the ramus and/or circumflex      itself.  4. Continued patency of the saphenous vein graft to the distal      posterolateral segment.    Cath DISPOSITION:  At the present time, cardiac rehab and medical therapy  will be recommended.     02/2015 [facility] Carotid US IMPRESSION: Bilateral ASVD. No  significant stenosis.      Stenosis is validated by velocity measurements with corresponding angiographic measurements and velocity criteria extrapolated in a process based on that defined by the Society of Radiologists in Ultrasound.     02/2015 [facility] Echo Interpretation Summary   A complete portable two-dimensional transthoracic echocardiogram was   performed. The study was technically difficult. The left ventricle is   mildly dilated.   There is mild concentric left ventricular hypertrophy.   The aortic valve is trileaflet.   There is anterior wall hypokinesis.  Unable to adequately determine diastolic dysfunction.   The left atrium is mildly dilated.   The left ventricular ejection fraction is normal (50-55%).      Left Ventricle   The left ventricle is mildly dilated. There is mild concentric left   ventricular hypertrophy. There is anterior wall hypokinesis. The left   ventricular ejection fraction is normal (50-55%). Unable to adequately   determine diastolic dysfunction.         Right Ventricle   The right ventricle is not well visualized. The right ventricle is grossly   normal in size and function.      Atria   The left atrium is mildly dilated. The right atrium is normal. The IVC is   normal in size.      Mitral Valve   There is mild mitral annular calcification. There is no mitral   regurgitation.         Tricuspid Valve   The tricuspid valve is grossly normal. There is no tricuspid regurgitation.      Aortic Valve   The aortic valve opens well. The aortic valve is trileaflet. There is no   aortic regurgitation present.      Pulmonic Valve   The pulmonic valve is not well visualized.      Vessels   The aortic root is normal in diameter.      Pericardium   There is no pericardial effusion.      MMode/2D Measurements & Calculations   IVSd: 1.3 cm                              LVIDd: 5.0 cm                                             LVIDs: 3.6 cm                                              LVPWd: 1.2 cm              _____________________________________________________________   LV mass(C)d: 257.0 grams                  Ao root diam: 3.7 cm      LV mass(C)dI: 120.6 grams/m2              Ao root area: 10.6 cm2                                             LA dimension: 4.1 cm      Doppler Measurements & Calculations   MV E max vel: 90.8 cm/sec                MV dec time: 0.26 sec   MV A max vel: 74.0 cm/sec   MV E/A: 1.2       06/2018 AAA Screen Summary: Abdominal Aorta: No evidence of an abdominal aortic aneurysm was visualized. The largest aortic measurement is 2.8 cm.     01/2019 nuclear stress No diagnostic ST segment changes over baseline. No significant arrhythmias. Large, moderate  to severe intensity, anteroseptal defect most prominent at the apex but extending to the base largely in the septal distribution. This region is fixed and consistent with prior infarct scar, no large ischemic territories. This is a high risk study based on infarct scar size and reduced LVEF. Nuclear stress EF: 36%.   08/2021 echo 1. Left ventricular ejection fraction, by estimation, is 40 to 45%. The  left ventricle has mildly decreased function. The left ventricle  demonstrates regional wall motion abnormalities (see scoring  diagram/findings for description). There is mild  asymmetric left ventricular hypertrophy of the septal segment. Left  ventricular diastolic parameters are consistent with Grade I diastolic  dysfunction (impaired relaxation).   2. Right ventricular systolic function is normal. The right ventricular  size is normal. Tricuspid regurgitation signal is inadequate for assessing  PA pressure.   3. The mitral valve is grossly normal. Trivial mitral valve  regurgitation.   4. The aortic valve is tricuspid. Aortic valve regurgitation is not  visualized. No aortic stenosis is present. Aortic valve mean gradient  measures 3.0 mmHg.    5. Aortic dilatation noted. There is mild dilatation of the ascending  aorta, measuring 38 mm.   6. The inferior vena cava is normal in size with greater than 50%  respiratory variability, suggesting right atrial pressure of 3 mmHg.   Comparison(s): Prior images unable to be directly viewed.    Assessment and Plan  1. Systolic dysfunction - echo LVEF 40-45% - last visit we stopped norvasc and started toprol, entresto - high bp's initially, so he stopped toprol and entresto and restarted his norvasc.  - will add toprol 12.5mg  daily for now, hold entresto, continue norvasc. Eventually would look to restart entresto and likely aldactone and farxiga in the near future - call us Monday update [place] on bp's and HRs      Antoine Poche, M.D., F.A.C.C.

## 2022-10-04 ENCOUNTER — Ambulatory Visit: Payer: 59 | Admitting: Nurse Practitioner

## 2022-10-06 ENCOUNTER — Other Ambulatory Visit: Payer: Self-pay | Admitting: Cardiology

## 2022-10-24 ENCOUNTER — Ambulatory Visit (INDEPENDENT_AMBULATORY_CARE_PROVIDER_SITE_OTHER): Payer: 59 | Admitting: Orthopaedic Surgery

## 2022-10-24 ENCOUNTER — Other Ambulatory Visit (INDEPENDENT_AMBULATORY_CARE_PROVIDER_SITE_OTHER): Payer: 59

## 2022-10-24 ENCOUNTER — Encounter: Payer: Self-pay | Admitting: Orthopaedic Surgery

## 2022-10-24 VITALS — Ht 70.5 in | Wt 193.0 lb

## 2022-10-24 DIAGNOSIS — M1712 Unilateral primary osteoarthritis, left knee: Secondary | ICD-10-CM | POA: Diagnosis not present

## 2022-10-24 DIAGNOSIS — M25562 Pain in left knee: Secondary | ICD-10-CM

## 2022-10-24 DIAGNOSIS — G8929 Other chronic pain: Secondary | ICD-10-CM

## 2022-10-25 DIAGNOSIS — M1712 Unilateral primary osteoarthritis, left knee: Secondary | ICD-10-CM | POA: Insufficient documentation

## 2022-10-25 NOTE — Progress Notes (Signed)
Office Visit Note   Patient: Eddie GAUTIER Sr.           Date of Birth: 10-29-1950           MRN: [id number] Visit Date: 10/24/2022              Requested by: [facility] [address] [place],  [place] [address] PCP: [facility]   Assessment & Plan: Visit Diagnoses:  1. Chronic pain of left knee   2. Unilateral primary osteoarthritis, left knee     Plan: Patient would like to proceed with total knee arthroplasty left knee.  He understands he is at increased risk for stroke, heart attack and anesthetic problems.  His A1c is 6.7.  He understands there is an increased risk he has family son available to take care of him after the procedure.  Questions elicited and answered will obtain preoperative cardiac and medical clearance and then we can proceed with scheduling.  We discussed spinal anesthesia Exparel, Marcaine, oxygen postop for protection.  Home health physical therapy discussed outpatient therapy.  He has been through this with the opposite knee in the past and would like to proceed.  Follow-Up Instructions: No follow-ups on file.   Orders:  Orders Placed This Encounter  Procedures   XR Knee 1-2 Views Left   No orders of the defined types were placed in this encounter.     Procedures: No procedures performed   Clinical Data: No additional findings.   Subjective: Chief Complaint  Patient presents with   Left Knee - Pain    HPI 72 year old male seen with severe left knee osteoarthritis 20 degrees varus.  Past history of right total knee arthroplasty done by Dr. Case in [place] doing well.  His left knee has been progressively painful does not respond to intra-articular injections.  He is seeing the local orthopedist who took Dr. Gayla Medicus place.  Patient states that he like to transfer his care and wants to proceed with left total knee arthroplasty at this time.  He does have a history of diabetes previous CABG x 1 embolic CVA.  Positive for  hypertension.  He has been on hydrocodone 10/325 4 tablets daily as well as gabapentin.  He is amatory with a very short stride shuffle gait with left knee varus and particularly medial joint line pain.  Knee is giving way on him.  He has had some falling.  He sees Dr. Wyline Mood as his cardiologist.  He states the knee bothers him today as well as at night.  Review of Systems no current chest pain positive for previous CVA.  Positive smoking.   Objective: Vital Signs: Ht 5' 10.5" (1.791 m)   Wt 193 lb (87.5 kg)   BMI 27.30 kg/m   Physical Exam Constitutional:      Appearance: He is well-developed.  HENT:     Head: Normocephalic and atraumatic.     Right Ear: External ear normal.     Left Ear: External ear normal.  Eyes:     Pupils: Pupils are equal, round, and reactive to light.  Neck:     Thyroid: No thyromegaly.     Trachea: No tracheal deviation.  Cardiovascular:     Rate and Rhythm: Normal rate.  Pulmonary:     Effort: Pulmonary effort is normal.     Breath sounds: No wheezing.  Abdominal:     General: Bowel sounds are normal.     Palpations: Abdomen is soft.  Musculoskeletal:     Cervical back: Neck supple.  Skin:    General: Skin is warm and dry.     Capillary Refill: Capillary refill takes less than 2 seconds.  Neurological:     Mental Status: He is alert and oriented to person, place, and time.  Psychiatric:        Behavior: Behavior normal.        Thought Content: Thought content normal.        Judgment: Judgment normal.     Ortho Exam median sternotomy incision.  Short stride shuffle gait similar to Parkinson's without acceleration.  Negative logroll to hips.  Well-healed right midline knee incision.  Left knee has 20 degrees varus palpable pedal pulses negative logroll hips and medial laxity.  Specialty Comments:  No specialty comments available.  Imaging: No results found.   PMFS History: Patient Active Problem List   Diagnosis Date Noted   Unilateral  primary osteoarthritis, left knee 10/25/2022   Benign localized prostatic hyperplasia with lower urinary tract symptoms (LUTS) 09/11/2020   Urinary retention 09/11/2020   Stroke due to embolism of cerebral artery (HCC) 03/08/2015   Diabetic polyneuropathy 10/06/2013   Drug intolerance    Ringing in ear    Claudication [facility])    Carotid artery disease (HCC)    Dyslipidemia    Hypertension    CAD (coronary artery disease)    Allergy history, drug    Diabetes mellitus    Fibromyalgia    Arm pain    Ejection fraction    Tobacco abuse    S/P CABG x 1 09/18/2007   Past Medical History:  Diagnosis Date   Allergy history, drug    plavix   Anxiety    Arm pain    continued discomfort in his arm- seeing Dr. Cliffton Asters   Blind right eye    CAD (coronary artery disease)    catheterizzation. 11/10/07. patent LIMA to the LAD with competitive filling, patent SVG to diagonal that enters a bifurcation point, occluded SVG to the distal marginal w/some collateralization, patent SVG to distal posterior lateral. medical therapy recommended.  EF 55% echo. June,2009. hypokinesis of the mid and distal septal wall. technically difficult study.   Carotid artery disease (HCC)    Doppler, July, 2012, 0-39% bilateral, followup 2 years   Cellulitis    s/p right lower extremity cellulitis   Claudication (HCC)    Arterial Dopplers, July, 2012, normal with normal ABI   Diabetes mellitus    type not specified   Drug intolerance    Patient is intolerant to both aspirin and Plavix.   Dyslipidemia    ED (erectile dysfunction)    Ejection fraction    EF 55%, echo, June, 2009, hypokinesis mid/distal septal wall, technically difficult study   Fibromyalgia    GERD (gastroesophageal reflux disease)    Glaucoma    right eye   H/O alcohol abuse    Alcohol abuse in the past   Hypertension    Polyneuropathy in diabetes(357.2) 10/06/2013   Psoriatic arthritis (HCC)    Ringing in ear    June, 2012 /  carotid  Dopplers, in July, 2012, 0-39% bilateral   S/P CABG x 1 09/18/2007   2009   Stroke due to embolism of cerebral artery (HCC) 03/08/2015   Right MCA   Tobacco abuse     Family History  Problem Relation Age of Onset   Dementia Mother    Diabetes Mother    Coronary artery disease Father  Heart attack Father    Cancer Brother    Coronary artery disease Other        family hx    Past Surgical History:  Procedure Laterality Date   CATARACT EXTRACTION Right    CORONARY ARTERY BYPASS GRAFT     CYSTOSCOPY WITH INSERTION OF UROLIFT N/A 12/04/2020   Procedure: CYSTOSCOPY WITH INSERTION OF UROLIFT;  Surgeon: Malen Gauze, MD;  Location: [facility];  Service: Urology;  Laterality: N/A;   Social History   Occupational History   Occupation: disabled - retired  Tobacco Use   Smoking status: Every Day    Packs/day: 0.50    Years: 45.00    Additional pack years: 0.00    Total pack years: 22.50    Types: Cigarettes    Start date: 05/31/1961   Smokeless tobacco: Never  Vaping Use   Vaping Use: Never used  Substance and Sexual Activity   Alcohol use: Yes    Alcohol/week: 0.0 standard drinks of alcohol    Comment: occasionally   Drug use: No   Sexual activity: Not on file

## 2022-10-29 ENCOUNTER — Other Ambulatory Visit: Payer: Self-pay | Admitting: Cardiology

## 2022-11-03 ENCOUNTER — Other Ambulatory Visit: Payer: Self-pay | Admitting: Cardiology

## 2022-11-04 ENCOUNTER — Telehealth: Payer: Self-pay | Admitting: Cardiology

## 2022-11-04 ENCOUNTER — Telehealth: Payer: Self-pay

## 2022-11-04 NOTE — Telephone Encounter (Signed)
Pt has been scheduled to see Sharlene Dory, NP 11/20/22 @ 1:30 for pre op clearance. I will update all parties involved.

## 2022-11-04 NOTE — Telephone Encounter (Signed)
PHONE # FOR SURGEON OFFICE IS [phone]  FAX# IS [phone]

## 2022-11-04 NOTE — Telephone Encounter (Signed)
   Pre-operative Risk Assessment    Patient Name: Eddie STAMLER Sr.  DOB: 09-07-1950 MRN: [id number]      Request for Surgical Clearance    Procedure:   L Total Knee Arthroplasty   Date of Surgery:  Clearance TBD                                 Surgeon:  Annell Greening MD  Surgeon's Group or Practice Name:  [facility]  Phone number:  [phone] Fax number:  [phone]   Type of Clearance Requested:   - Medical    Type of Anesthesia:  Spinal   Additional requests/questions:    Lajuana Matte   11/04/2022, 10:33 AM

## 2022-11-04 NOTE — Telephone Encounter (Signed)
   Name: Eddie PHOUTHAVONG Sr.  DOB: [birth year]  MRN: [id number]  Primary Cardiologist: Dina Rich, MD  Chart reviewed as part of pre-operative protocol coverage. Because of CYRILL LUCCHESE Sr.'s past medical history and time since last visit, he will require a follow-up in-office visit in order to better assess preoperative cardiovascular risk.  Pt was due for follow-up in 09/2022 but it appears he canceled his appointment.  Pre-op covering staff: - Please schedule appointment and call patient to inform them. If patient already had an upcoming appointment within acceptable timeframe, please add "pre-op clearance" to the appointment notes so provider is aware. - Please contact requesting surgeon's office via preferred method (i.e, phone, fax) to inform them of need for appointment prior to surgery.  Joylene Grapes, NP  11/04/2022, 12:01 PM

## 2022-11-04 NOTE — Telephone Encounter (Signed)
I called patient and advised Dr. Ophelia Charter wants PCP and cardiac clearance before scheduling surgery.  He has a cardiology appt. 11/20/22.  He will follow up with PCP to make sure they return clearance.   Message Received: 3 days ago [address], [place], [place] Pt has called  wanting to know the status of his surgery. Could you please give him a call at [phone]

## 2022-11-06 ENCOUNTER — Other Ambulatory Visit: Payer: Self-pay | Admitting: Cardiology

## 2022-11-19 ENCOUNTER — Ambulatory Visit: Payer: 59 | Admitting: Nurse Practitioner

## 2022-11-20 ENCOUNTER — Ambulatory Visit: Payer: 59 | Admitting: Nurse Practitioner

## 2022-11-20 NOTE — Progress Notes (Deleted)
  Cardiology Office Note:  .   Date:  11/20/2022  ID:  Eddie Santee Sr., DOB 1950/09/30, MRN [id number] PCP: [facility]  [facility] Providers Cardiologist:  Dina Rich, MD { Click to update primary MD,subspecialty MD or APP then REFRESH:1}   History of Present Illness: .   Eddie POLE Sr. is a 72 y.o. male with a PMH of HFmrEF, CAD, s/p CABG, HLD, T2DM, GERD, past alcohol abuse, HTN, polyneuropathy, tobacco abuse, and past right MCA stroke, who presents today for scheduled follow-up.   Last seen by Dr. Dina Rich on Oct 12, 2021. Was overall doing well, BP soft in office, therefore medications were adjusted. Dr. Wyline Mood recommended looking to restart Entresto in future with addition of Aldactone/Farxiga in the future.   Today he presents for follow-up and pre-operative cardiovascular risk assessment. He states...   ROS: ***  Studies Reviewed: .        *** Risk Assessment/Calculations:   {Does this patient have ATRIAL FIBRILLATION?:[id number]} No BP recorded.  {Refresh Note OR Click here to enter BP  :1}***       Physical Exam:   VS:  There were no vitals taken for this visit.   Wt Readings from Last 3 Encounters:  10/24/22 193 lb (87.5 kg)  10/12/21 181 lb 3.2 oz (82.2 kg)  08/30/21 184 lb 3.2 oz (83.6 kg)    GEN: Well nourished, well developed in no acute distress NECK: No JVD; No carotid bruits CARDIAC: ***RRR, no murmurs, rubs, gallops RESPIRATORY:  Clear to auscultation without rales, wheezing or rhonchi  ABDOMEN: Soft, non-tender, non-distended EXTREMITIES:  No edema; No deformity   ASSESSMENT AND PLAN: .   ***    {Are you ordering a CV Procedure (e.g. stress test, cath, DCCV, TEE, etc)?   Press F2        :[id number]}  Dispo: ***  Signed, Sharlene Dory, NP

## 2022-12-04 ENCOUNTER — Other Ambulatory Visit: Payer: Self-pay | Admitting: Cardiology

## 2022-12-06 ENCOUNTER — Ambulatory Visit: Payer: 59 | Admitting: Nurse Practitioner

## 2022-12-06 ENCOUNTER — Ambulatory Visit: Payer: 59 | Attending: Nurse Practitioner | Admitting: Nurse Practitioner

## 2022-12-06 ENCOUNTER — Other Ambulatory Visit: Payer: Self-pay

## 2022-12-06 VITALS — BP 126/72 | HR 85 | Ht 70.0 in | Wt 183.4 lb

## 2022-12-06 DIAGNOSIS — Z01810 Encounter for preprocedural cardiovascular examination: Secondary | ICD-10-CM

## 2022-12-06 DIAGNOSIS — I10 Essential (primary) hypertension: Secondary | ICD-10-CM

## 2022-12-06 DIAGNOSIS — I2510 Atherosclerotic heart disease of native coronary artery without angina pectoris: Secondary | ICD-10-CM

## 2022-12-06 DIAGNOSIS — I519 Heart disease, unspecified: Secondary | ICD-10-CM

## 2022-12-06 DIAGNOSIS — E785 Hyperlipidemia, unspecified: Secondary | ICD-10-CM

## 2022-12-06 DIAGNOSIS — I5022 Chronic systolic (congestive) heart failure: Secondary | ICD-10-CM

## 2022-12-06 DIAGNOSIS — Z8673 Personal history of transient ischemic attack (TIA), and cerebral infarction without residual deficits: Secondary | ICD-10-CM

## 2022-12-06 MED ORDER — SPIRONOLACTONE 25 MG PO TABS: 25.0000 mg | ORAL_TABLET | Freq: Every day | ORAL | 2 refills | Status: DC

## 2022-12-06 NOTE — Progress Notes (Deleted)
  Cardiology Office Note:  .   Date:  12/06/2022  ID:  Eddie Santee Sr., DOB 18-Oct-1950, MRN [id number] PCP: [facility]  [facility] Providers Cardiologist:  Dina Rich, MD { Click to update primary MD,subspecialty MD or APP then REFRESH:1}   History of Present Illness: .   Eddie MENDONSA Sr. is a 72 y.o. male with a PMH of HFmrEF, CAD, s/p CABG, HLD, T2DM, GERD, past alcohol abuse, HTN, polyneuropathy, tobacco abuse, and past right MCA stroke, who presents today for scheduled follow-up.   Last seen by Dr. Dina Rich on Oct 12, 2021. Was overall doing well, BP soft in office, therefore medications were adjusted. Dr. Wyline Mood recommended looking to restart Entresto in future with addition of Aldactone/Farxiga in the future.   Today he presents for follow-up and pre-operative cardiovascular risk assessment. He states...   ROS: ***  Studies Reviewed: .        *** Risk Assessment/Calculations:   {Does this patient have ATRIAL FIBRILLATION?:[id number]} No BP recorded.  {Refresh Note OR Click here to enter BP  :1}***       Physical Exam:   VS:  There were no vitals taken for this visit.   Wt Readings from Last 3 Encounters:  10/24/22 193 lb (87.5 kg)  10/12/21 181 lb 3.2 oz (82.2 kg)  08/30/21 184 lb 3.2 oz (83.6 kg)    GEN: Well nourished, well developed in no acute distress NECK: No JVD; No carotid bruits CARDIAC: ***RRR, no murmurs, rubs, gallops RESPIRATORY:  Clear to auscultation without rales, wheezing or rhonchi  ABDOMEN: Soft, non-tender, non-distended EXTREMITIES:  No edema; No deformity   ASSESSMENT AND PLAN: .   ***    {Are you ordering a CV Procedure (e.g. stress test, cath, DCCV, TEE, etc)?   Press F2        :[id number]}  Dispo: ***  Signed, Sharlene Dory, NP

## 2022-12-06 NOTE — Patient Instructions (Addendum)
Medication Instructions:  Your physician has recommended you make the following change in your medication:  Start Spironolactone 25 mg once daily Continue on all other medications as prescribed  Labwork: BMET Magnesium Non-fasting at [facility] in 1 week-07/216/2024  Testing/Procedures: None  Follow-Up: Your physician recommends that you schedule a follow-up appointment in: 6 months  Any Other Special Instructions Will Be Listed Below (If Applicable).  If you need a refill on your cardiac medications before your next appointment, please call your pharmacy.

## 2022-12-06 NOTE — Progress Notes (Unsigned)
Cardiology Office Note:  .   Date:  12/06/2022 ID:  Eddie Santee Sr., DOB 09-10-1950, MRN [id number] PCP: [facility]  [facility] Providers Cardiologist:  Dina Rich, MD {  History of Present Illness: .   Eddie LABELL Sr. is a 72 y.o. male with a PMH of HFmrEF, CAD, s/p CABG, HLD, T2DM, GERD, past alcohol abuse, HTN, polyneuropathy, tobacco abuse, and past right MCA stroke, who presents today for scheduled follow-up.   Last seen by Dr. Dina Rich on Oct 12, 2021. Was overall doing well, BP soft in office, therefore medications were adjusted. Dr. Wyline Mood recommended looking to restart Entresto in future with addition of Aldactone/Farxiga in the future.   Today he presents for follow-up and pre-operative cardiovascular risk assessment. He is pending left total knee arthroplasty. Surgeon will be Dr. Annell Greening of [facility]. Date of Surgery is TBD. Today he states he is doing well. Does admit to issues with neuropathy and therefore activity is somewhat limited, but overall doing well from a cardiac perspective. Denies any chest pain, shortness of breath, palpitations, syncope, presyncope, dizziness, orthopnea, PND, swelling or significant weight changes, acute bleeding, or claudication.  Studies Reviewed: Marland Kitchen       EKG 12/06/2022: NSR 79 bpm nonspecifc T wave anormality, artifact noted in multiple leads.   Echo 08/2021:   1. Left ventricular ejection fraction, by estimation, is 40 to 45%. The  left ventricle has mildly decreased function. The left ventricle  demonstrates regional wall motion abnormalities (see scoring  diagram/findings for description). There is mild  asymmetric left ventricular hypertrophy of the septal segment. Left  ventricular diastolic parameters are consistent with Grade I diastolic  dysfunction (impaired relaxation).   2. Right ventricular systolic function is normal. The right ventricular  size is normal. Tricuspid regurgitation  signal is inadequate for assessing  PA pressure.   3. The mitral valve is grossly normal. Trivial mitral valve  regurgitation.   4. The aortic valve is tricuspid. Aortic valve regurgitation is not  visualized. No aortic stenosis is present. Aortic valve mean gradient  measures 3.0 mmHg.   5. Aortic dilatation noted. There is mild dilatation of the ascending  aorta, measuring 38 mm.   6. The inferior vena cava is normal in size with greater than 50%  respiratory variability, suggesting right atrial pressure of 3 mmHg.   Comparison(s): Prior images unable to be directly viewed.  Lexiscan 01/2019: No diagnostic ST segment changes over baseline. No significant arrhythmias. Large, moderate to severe intensity, anteroseptal defect most prominent at the apex but extending to the base largely in the septal distribution. This region is fixed and consistent with prior infarct scar, no large ischemic territories. This is a high risk study based on infarct scar size and reduced LVEF. Nuclear stress EF: 36%.  Physical Exam:   VS:  BP 126/72   Pulse 85   Ht 5\' 10"  (1.778 m)   Wt 183 lb 6.4 oz (83.2 kg)   SpO2 94%   BMI 26.32 kg/m    Wt Readings from Last 3 Encounters:  12/06/22 183 lb 6.4 oz (83.2 kg)  10/24/22 193 lb (87.5 kg)  10/12/21 181 lb 3.2 oz (82.2 kg)    GEN: Well nourished, well developed in no acute distress NECK: No JVD; No carotid bruits CARDIAC: S1/S2, RRR, no murmurs, rubs, gallops RESPIRATORY:  Clear to auscultation without rales, wheezing or rhonchi  ABDOMEN: Soft, non-tender, non-distended EXTREMITIES:  No edema; No deformity, wearing left knee  brace  ASSESSMENT AND PLAN: .    Pre-operative cardiovascular risk assessment [name] perioperative risk of a major cardiac event is 11% according to the Revised Cardiac Risk Index (RCRI).  Therefore, he is at high risk for perioperative complications.   His functional capacity is fair at 4.31 METs according to the [facility]  Activity Status Index (DASI). Recommendations: According to ACC/AHA guidelines, no further cardiovascular testing needed.  The patient may proceed to surgery at acceptable risk.   Antiplatelet and/or Anticoagulation Recommendations: Okay to hold Aspirin 7 days prior to procedure and resume when felt safe to do so and instructed by surgeon. Patient is not on any oral anticoagulant that needs to be held prior to surgery. Patient is aware of his high risk and is agreeable to proceed. Will route note to requesting party.   2. HFmrEF Stage C, NYHA class I-II symptoms. EF 40-45%. Euvolemic and well compensated on exam. Continue Entresto and Toprol XL. Will start Aldactone 25 mg daily and obtain BMET and Magnesium in 1 week per protocol. Low sodium diet, fluid restriction <2L, and daily weights encouraged. Educated to contact our office for weight gain of 2 lbs overnight or 5 lbs in one week.   3. CAD, s/p CABG Stable with no anginal symptoms. No indication for ischemic evaluation. Continue Aspirin, atorvastatin, Toprol XL, Entresto, and NTG PRN. Heart healthy diet and regular cardiovascular exercise encouraged. ED precautions discussed.   4. HTN BP stable. Continue current medication regimen. Will start Aldactone 25 mg daily and obtain BMET and Magnesium in 1 week. Discussed to monitor BP at home at least 2 hours after medications and sitting for 5-10 minutes. Heart healthy diet and regular cardiovascular exercise encouraged.   5. HLD Recent LDL 93. Managed by PCP. Continue atorvastatin. Recommend increasing atorvastatin to 40 mg daily at next OV. Heart healthy diet and regular cardiovascular exercise encouraged.   6. Past CVA Denies any symptoms. Continue current medication regimen.  Heart healthy diet and regular cardiovascular exercise encouraged.   Dispo: Follow-up in 6 months with me or APP or sooner if anything changes.   Signed, Sharlene Dory, NP

## 2022-12-11 ENCOUNTER — Telehealth: Payer: Self-pay | Admitting: Radiology

## 2022-12-11 NOTE — Telephone Encounter (Signed)
Please see message from [place] office and advise.  Looks like you all have discussed clearances.   He called and wants to know when he can be scheduled for his knee replacement.  His number is [phone].

## 2022-12-20 NOTE — Telephone Encounter (Signed)
I called patient, no answer and voice mail full.

## 2022-12-24 NOTE — Telephone Encounter (Signed)
I called patient and scheduled surgery. 

## 2023-01-01 ENCOUNTER — Other Ambulatory Visit: Payer: Self-pay | Admitting: Cardiology

## 2023-01-03 ENCOUNTER — Other Ambulatory Visit: Payer: Self-pay | Admitting: Physician Assistant

## 2023-02-04 NOTE — Progress Notes (Signed)
Surgical Instructions    Your procedure is scheduled on Wednesday, September 25.     Report to [facility] Main Entrance "A" at 1030 A.M., then check in with the Admitting office.  Call this number if you have problems the morning of surgery:  [phone]  If you have any questions prior to your surgery date call [phone]: Open Monday-Friday 8am-4pm If you experience any cold or flu symptoms such as cough, fever, chills, shortness of breath, etc. between now and your scheduled surgery, please notify us at the above number.     Remember:  Do not eat after midnight the night before your surgery  You may drink clear liquids until 0930 AM the morning of your surgery.   Clear liquids allowed are: Water, Non-Citrus Juices (without pulp), Carbonated Beverages, Clear Tea, Black Coffee Only (NO MILK, CREAM OR POWDERED CREAMER of any kind), and Gatorade.   Drink ONE (1) 12 oz G2 given to you in your pre admission testing appointment by 0930 AM the morning of surgery. Drink in one sitting. Do not sip.  This drink was given to you during your hospital  pre-op appointment visit.  Nothing else to drink after completing the  12 oz bottle of G2.         If you have questions, please contact your surgeon's office.     Take these medicines the morning of surgery with A SIP OF WATER:              albuterol (VENTOLIN HFA) inhaler as needed              ALPRAZolam Prudy Feeler) as needed             amLODipine (NORVASC)              EPINEPHrine (PRIMATENE MIST) inhaler  as needed             esomeprazole (NEXIUM) as needed             fluticasone (FLONASE) as needed             gabapentin (NEURONTIN)              HYDROcodone-acetaminophen (NORCO)              metoprolol succinate              nitroGLYCERIN (NITROSTAT) as needed (please let us know if you have to take it between your PAT appointment and the surgery day)  Please bring your inhalers to the hospital.   Follow your surgeon's  instructions on when to stop Aspirin.  If no instructions were given by your surgeon then you will need to call the office to get those instructions.     As of today, STOP taking any Aleve, Naproxen, Ibuprofen, Motrin, Advil, Goody's, BC's, all herbal medications, fish oil, and all vitamins.  WHAT DO I DO ABOUT MY DIABETES MEDICATION?   Do not take oral diabetes medicines (pills) the morning of surgery. This includes metFORMIN (GLUCOPHAGE) .   HOW TO MANAGE YOUR DIABETES BEFORE AND AFTER SURGERY  Why is it important to control my blood sugar before and after surgery? Improving blood sugar levels before and after surgery helps healing and can limit problems. A way of improving blood sugar control is eating a healthy diet by:  Eating less sugar and carbohydrates  Increasing activity/exercise  Talking with your doctor about reaching your blood sugar goals High blood sugars (greater than 180 mg/dL) can raise  your risk of infections and slow your recovery, so you will need to focus on controlling your diabetes during the weeks before surgery. Make sure that the doctor who takes care of your diabetes knows about your planned surgery including the date and location.  How do I manage my blood sugar before surgery? Check your blood sugar at least 4 times a day, starting 2 days before surgery, to make sure that the level is not too high or low.  Check your blood sugar the morning of your surgery when you wake up and every 2 hours until you get to the Short Stay unit.  If your blood sugar is less than 70 mg/dL, you will need to treat for low blood sugar: Do not take insulin. Treat a low blood sugar (less than 70 mg/dL) with  cup of clear juice (cranberry or apple), 4 glucose tablets, OR glucose gel. Recheck blood sugar in 15 minutes after treatment (to make sure it is greater than 70 mg/dL). If your blood sugar is not greater than 70 mg/dL on recheck, call [phone] for further  instructions. Report your blood sugar to the short stay nurse when you get to Short Stay.  If you are admitted to the hospital after surgery: Your blood sugar will be checked by the staff and you will probably be given insulin after surgery (instead of oral diabetes medicines) to make sure you have good blood sugar levels. The goal for blood sugar control after surgery is 80-180 mg/dL.                      Do NOT Smoke (Tobacco/Vaping) for 24 hours prior to your procedure.  If you use a CPAP at night, you may bring your mask/headgear for your overnight stay.   Contacts, glasses, piercing's, hearing aid's, dentures or partials may not be worn into surgery, please bring cases for these belongings.    For patients admitted to the hospital, discharge time will be determined by your treatment team.   Patients discharged the day of surgery will not be allowed to drive home, and someone needs to stay with them for 24 hours.  SURGICAL WAITING ROOM VISITATION Patients having surgery or a procedure may have no more than 2 support people in the waiting area - these visitors may rotate.   Children under the age of 3 must have an adult with them who is not the patient. If the patient needs to stay at the hospital during part of their recovery, the visitor guidelines for inpatient rooms apply. Pre-op nurse will coordinate an appropriate time for 1 support person to accompany patient in pre-op.  This support person may not rotate.   Please refer to the [facility] website for the visitor guidelines for Inpatients (after your surgery is over and you are in a regular room).    Special instructions:      Pre-operative 5 CHG Bath Instructions   You can play a key role in reducing the risk of infection after surgery. Your skin needs to be as free of germs as possible. You can reduce the number of germs on your skin by washing with CHG (chlorhexidine gluconate) soap before surgery. CHG is an antiseptic  soap that kills germs and continues to kill germs even after washing.   DO NOT use if you have an allergy to chlorhexidine/CHG or antibacterial soaps. If your skin becomes reddened or irritated, stop using the CHG and notify one of our RNs at [phone].  Please shower with the CHG soap starting 4 days before surgery using the following schedule:     Please keep in mind the following:  DO NOT shave, including legs and underarms, starting the day of your first shower.   You may shave your face at any point before/day of surgery.  Place clean sheets on your bed the day you start using CHG soap. Use a clean washcloth (not used since being washed) for each shower. DO NOT sleep with pets once you start using the CHG.   CHG Shower Instructions:  If you choose to wash your hair and private area, wash first with your normal shampoo/soap.  After you use shampoo/soap, rinse your hair and body thoroughly to remove shampoo/soap residue.  Turn the water OFF and apply about 3 tablespoons (45 ml) of CHG soap to a CLEAN washcloth.  Apply CHG soap ONLY FROM YOUR NECK DOWN TO YOUR TOES (washing for 3-5 minutes)  DO NOT use CHG soap on face, private areas, open wounds, or sores.  Pay special attention to the area where your surgery is being performed.  If you are having back surgery, having someone wash your back for you may be helpful. Wait 2 minutes after CHG soap is applied, then you may rinse off the CHG soap.  Pat dry with a clean towel  Put on clean clothes/pajamas   If you choose to wear lotion, please use ONLY the CHG-compatible lotions on the back of this paper.     Additional instructions for the day of surgery: DO NOT APPLY any lotions, deodorants, cologne, or perfumes.   Put on clean/comfortable clothes.  Brush your teeth with a regular toothpaste.   Ask your nurse before applying any prescription medications to the skin.      CHG Compatible Lotions   Aveeno Moisturizing lotion   Cetaphil Moisturizing Cream  Cetaphil Moisturizing Lotion  Clairol Herbal Essence Moisturizing Lotion, Dry Skin  Clairol Herbal Essence Moisturizing Lotion, Extra Dry Skin  Clairol Herbal Essence Moisturizing Lotion, Normal Skin  Curel Age Defying Therapeutic Moisturizing Lotion with Alpha Hydroxy  Curel Extreme Care Body Lotion  Curel Soothing Hands Moisturizing Hand Lotion  Curel Therapeutic Moisturizing Cream, Fragrance-Free  Curel Therapeutic Moisturizing Lotion, Fragrance-Free  Curel Therapeutic Moisturizing Lotion, Original Formula  Eucerin Daily Replenishing Lotion  Eucerin Dry Skin Therapy Plus Alpha Hydroxy Crme  Eucerin Dry Skin Therapy Plus Alpha Hydroxy Lotion  Eucerin Original Crme  Eucerin Original Lotion  Eucerin Plus Crme Eucerin Plus Lotion  Eucerin TriLipid Replenishing Lotion  Keri Anti-Bacterial Hand Lotion  Keri Deep Conditioning Original Lotion Dry Skin Formula Softly Scented  Keri Deep Conditioning Original Lotion, Fragrance Free Sensitive Skin Formula  Keri Lotion Fast Absorbing Fragrance Free Sensitive Skin Formula  Keri Lotion Fast Absorbing Softly Scented Dry Skin Formula  Keri Original Lotion  Keri Skin Renewal Lotion Keri Silky Smooth Lotion  Keri Silky Smooth Sensitive Skin Lotion  Nivea Body Creamy Conditioning Oil  Nivea Body Extra Enriched Lotion  Nivea Body Original Lotion  Nivea Body Sheer Moisturizing Lotion Nivea Crme  Nivea Skin Firming Lotion  NutraDerm 30 Skin Lotion  NutraDerm Skin Lotion  NutraDerm Therapeutic Skin Cream  NutraDerm Therapeutic Skin Lotion  ProShield Protective Hand Cream  Provon moisturizing lotion  Please read over the following fact sheets that you were given.    If you received a COVID test during your pre-op visit  it is requested that you wear a mask when out in public, stay away from anyone  that may not be feeling well and notify your surgeon if you develop symptoms. If you have been in contact with anyone  that has tested positive in the last 10 days please notify you surgeon.

## 2023-02-05 ENCOUNTER — Other Ambulatory Visit (HOSPITAL_COMMUNITY): Payer: 59

## 2023-02-05 ENCOUNTER — Ambulatory Visit: Payer: 59 | Admitting: Physician Assistant

## 2023-02-05 ENCOUNTER — Inpatient Hospital Stay (HOSPITAL_COMMUNITY)
Admission: RE | Admit: 2023-02-05 | Discharge: 2023-02-05 | Disposition: A | Discharge status: Home / Self Care | Payer: 59 | Source: Ambulatory Visit

## 2023-02-10 ENCOUNTER — Ambulatory Visit (INDEPENDENT_AMBULATORY_CARE_PROVIDER_SITE_OTHER): Payer: 59 | Admitting: Physician Assistant

## 2023-02-10 ENCOUNTER — Encounter: Payer: Self-pay | Admitting: Physician Assistant

## 2023-02-10 ENCOUNTER — Other Ambulatory Visit: Payer: Self-pay

## 2023-02-10 ENCOUNTER — Encounter (HOSPITAL_COMMUNITY)
Admission: RE | Admit: 2023-02-10 | Discharge: 2023-02-10 | Disposition: A | Discharge status: Home / Self Care | Payer: 59 | Source: Ambulatory Visit | Attending: Orthopaedic Surgery | Admitting: Orthopaedic Surgery

## 2023-02-10 ENCOUNTER — Encounter (HOSPITAL_COMMUNITY): Payer: Self-pay

## 2023-02-10 VITALS — BP 112/69 | HR 73

## 2023-02-10 VITALS — BP 102/63 | HR 77 | Temp 98.0°F | Resp 17 | Ht 70.0 in | Wt 185.4 lb

## 2023-02-10 DIAGNOSIS — Z951 Presence of aortocoronary bypass graft: Secondary | ICD-10-CM | POA: Insufficient documentation

## 2023-02-10 DIAGNOSIS — E785 Hyperlipidemia, unspecified: Secondary | ICD-10-CM | POA: Diagnosis not present

## 2023-02-10 DIAGNOSIS — L4050 Arthropathic psoriasis, unspecified: Secondary | ICD-10-CM | POA: Diagnosis not present

## 2023-02-10 DIAGNOSIS — M1712 Unilateral primary osteoarthritis, left knee: Secondary | ICD-10-CM

## 2023-02-10 DIAGNOSIS — I119 Hypertensive heart disease without heart failure: Secondary | ICD-10-CM | POA: Diagnosis not present

## 2023-02-10 DIAGNOSIS — E1151 Type 2 diabetes mellitus with diabetic peripheral angiopathy without gangrene: Secondary | ICD-10-CM | POA: Diagnosis not present

## 2023-02-10 DIAGNOSIS — M797 Fibromyalgia: Secondary | ICD-10-CM | POA: Diagnosis not present

## 2023-02-10 DIAGNOSIS — E1142 Type 2 diabetes mellitus with diabetic polyneuropathy: Secondary | ICD-10-CM | POA: Insufficient documentation

## 2023-02-10 DIAGNOSIS — J449 Chronic obstructive pulmonary disease, unspecified: Secondary | ICD-10-CM | POA: Diagnosis not present

## 2023-02-10 DIAGNOSIS — Z01812 Encounter for preprocedural laboratory examination: Secondary | ICD-10-CM | POA: Diagnosis present

## 2023-02-10 DIAGNOSIS — Z87891 Personal history of nicotine dependence: Secondary | ICD-10-CM | POA: Diagnosis not present

## 2023-02-10 DIAGNOSIS — K219 Gastro-esophageal reflux disease without esophagitis: Secondary | ICD-10-CM | POA: Diagnosis not present

## 2023-02-10 DIAGNOSIS — H409 Unspecified glaucoma: Secondary | ICD-10-CM | POA: Diagnosis not present

## 2023-02-10 DIAGNOSIS — I2510 Atherosclerotic heart disease of native coronary artery without angina pectoris: Secondary | ICD-10-CM | POA: Insufficient documentation

## 2023-02-10 DIAGNOSIS — Z01818 Encounter for other preprocedural examination: Secondary | ICD-10-CM

## 2023-02-10 HISTORY — DX: Chronic obstructive pulmonary disease, unspecified: J44.9

## 2023-02-10 LAB — CBC
HCT: 45.4 % (ref 39.0–52.0)
Hemoglobin: 14.8 g/dL (ref 13.0–17.0)
MCH: 31.2 pg (ref 26.0–34.0)
MCHC: 32.6 g/dL (ref 30.0–36.0)
MCV: 95.8 fL (ref 80.0–100.0)
Platelets: 265 10*3/uL (ref 150–400)
RBC: 4.74 MIL/uL (ref 4.22–5.81)
RDW: 13.2 % (ref 11.5–15.5)
WBC: 11.1 10*3/uL — ABNORMAL HIGH (ref 4.0–10.5)
nRBC: 0 % (ref 0.0–0.2)

## 2023-02-10 LAB — SURGICAL PCR SCREEN
MRSA, PCR: NEGATIVE
Staphylococcus aureus: NEGATIVE

## 2023-02-10 LAB — COMPREHENSIVE METABOLIC PANEL
ALT: 16 U/L (ref 0–44)
AST: 22 U/L (ref 15–41)
Albumin: 3.8 g/dL (ref 3.5–5.0)
Alkaline Phosphatase: 84 U/L (ref 38–126)
Anion gap: 7 (ref 5–15)
BUN: 23 mg/dL (ref 8–23)
CO2: 28 mmol/L (ref 22–32)
Calcium: 8.6 mg/dL — ABNORMAL LOW (ref 8.9–10.3)
Chloride: 100 mmol/L (ref 98–111)
Creatinine, Ser: 1.39 mg/dL — ABNORMAL HIGH (ref 0.61–1.24)
GFR, Estimated: 54 mL/min — ABNORMAL LOW (ref >60)
Glucose, Bld: 183 mg/dL — ABNORMAL HIGH (ref 70–99)
Potassium: 4.3 mmol/L (ref 3.5–5.1)
Sodium: 135 mmol/L (ref 135–145)
Total Bilirubin: 0.5 mg/dL (ref 0.3–1.2)
Total Protein: 6.6 g/dL (ref 6.5–8.1)

## 2023-02-10 LAB — HEMOGLOBIN A1C
Hgb A1c MFr Bld: 6.5 % — ABNORMAL HIGH (ref 4.8–5.6)
Mean Plasma Glucose: 139.85 mg/dL

## 2023-02-10 LAB — GLUCOSE, CAPILLARY: Glucose-Capillary: 222 mg/dL — ABNORMAL HIGH (ref 70–99)

## 2023-02-10 NOTE — Progress Notes (Signed)
Office Visit Note   Patient: Eddie WICE Sr.           Date of Birth: 1950/07/19           MRN: [id number] Visit Date: 02/10/2023              Requested by: [facility] [address] [place],  [place] [address] PCP: [facility]  Left knee pain    HPI: Eddie Reed is a pleasant 72 year old gentleman with a history of varus arthritis of his left knee.  He is status post right total knee arthroplasty in the past.  His health has been stable after an extensive discussion with Dr. Ophelia Charter he wants to go forward with a left knee replacement  Assessment & Plan: Visit Diagnoses:  1. Unilateral primary osteoarthritis, left knee     Plan: Reviewed and risks of surgery were reviewed with him which include but are not limited to bleeding, infection, anesthesia complications, stroke, need for future surgery.  Will go forward with this and he wishes to proceed full H&P dictated into the hospital system  Follow-Up Instructions: Return in about 2 weeks (around 02/24/2023).   Ortho Exam  Patient is alert, oriented, no adenopathy, well-dressed, normal affect, normal respiratory effort.  Ortho Exam median sternotomy incision.  Short stride shuffle gait similar to Parkinson's without acceleration.  Negative logroll to hips.  Well-healed right midline knee incision.  Left knee has 20 degrees varus palpable pedal pulses negative logroll hips and medial laxity.    Imaging: No results found. No images are attached to the encounter.  Labs: Lab Results  Component Value Date   HGBA1C 6.7 (H) 11/29/2020   HGBA1C (H) 11/09/2007    7.2 (NOTE)   The ADA recommends the following therapeutic goals for glycemic   control related to Hgb A1C measurement:   Goal of Therapy:   < 7.0% Hgb A1C   Action Suggested:  > 8.0% Hgb A1C   Ref:  Diabetes Care, 22, Suppl. 1, 1999   HGBA1C (H) 10/07/2007    7.2 (NOTE)   The ADA recommends the following therapeutic goals for glycemic   control related to  Hgb A1C measurement:   Goal of Therapy:   < 7.0% Hgb A1C   Action Suggested:  > 8.0% Hgb A1C   Ref:  Diabetes Care, 22, Suppl. 1, 1999   REPTSTATUS 11/12/2007 FINAL 11/10/2007   CULT INSIGNIFICANT GROWTH 11/10/2007     Lab Results  Component Value Date   ALBUMIN 3.7 11/09/2007   ALBUMIN 3.2 (L) 10/10/2007   ALBUMIN 3.9 10/05/2007    Lab Results  Component Value Date   MG 2.6 (H) 10/09/2007   MG 2.6 (H) 10/09/2007   MG 3.0 (H) 10/08/2007   No results found for: "VD25OH"  No results found for: "PREALBUMIN"    11/11/2007    3:56 AM 11/10/2007    6:30 AM 11/09/2007   11:45 PM  CBC EXTENDED  WBC 11.3  10.0  11.4   RBC 4.58  4.22  4.44   Hemoglobin 14.0  12.9  13.5   HCT 42.4  38.4  41.0   Platelets 275  290  322      There is no height or weight on file to calculate BMI.  Orders:  No orders of the defined types were placed in this encounter.  No orders of the defined types were placed in this encounter.    Procedures: No procedures performed  Clinical Data: No  additional findings.  ROS:  All other systems negative, except as noted in the HPI. Review of Systems  Objective: Vital Signs: There were no vitals taken for this visit.  Specialty Comments:  No specialty comments available.  PMFS History: Patient Active Problem List   Diagnosis Date Noted   Unilateral primary osteoarthritis, left knee 10/25/2022   Benign localized prostatic hyperplasia with lower urinary tract symptoms (LUTS) 09/11/2020   Urinary retention 09/11/2020   Stroke due to embolism of cerebral artery (HCC) 03/08/2015   Diabetic polyneuropathy 10/06/2013   Drug intolerance    Ringing in ear    Claudication [facility])    Carotid artery disease (HCC)    Dyslipidemia    Hypertension    CAD (coronary artery disease)    Allergy history, drug    Diabetes mellitus    Fibromyalgia    Arm pain    Ejection fraction    Tobacco abuse    S/P CABG x 1 09/18/2007   Past Medical History:   Diagnosis Date   Allergy history, drug    plavix   Anxiety    Arm pain    continued discomfort in his arm- seeing Dr. Cliffton Asters   Blind right eye    CAD (coronary artery disease)    catheterizzation. 11/10/07. patent LIMA to the LAD with competitive filling, patent SVG to diagonal that enters a bifurcation point, occluded SVG to the distal marginal w/some collateralization, patent SVG to distal posterior lateral. medical therapy recommended.  EF 55% echo. June,2009. hypokinesis of the mid and distal septal wall. technically difficult study.   Carotid artery disease (HCC)    Doppler, July, 2012, 0-39% bilateral, followup 2 years   Cellulitis    s/p right lower extremity cellulitis   Claudication (HCC)    Arterial Dopplers, July, 2012, normal with normal ABI   Diabetes mellitus    type not specified   Drug intolerance    Patient is intolerant to both aspirin and Plavix.   Dyslipidemia    ED (erectile dysfunction)    Ejection fraction    EF 55%, echo, June, 2009, hypokinesis mid/distal septal wall, technically difficult study   Fibromyalgia    GERD (gastroesophageal reflux disease)    Glaucoma    right eye   H/O alcohol abuse    Alcohol abuse in the past   Hypertension    Polyneuropathy in diabetes(357.2) 10/06/2013   Psoriatic arthritis (HCC)    Ringing in ear    June, 2012 /  carotid Dopplers, in July, 2012, 0-39% bilateral   S/P CABG x 1 09/18/2007   2009   Stroke due to embolism of cerebral artery (HCC) 03/08/2015   Right MCA   Tobacco abuse     Family History  Problem Relation Age of Onset   Dementia Mother    Diabetes Mother    Coronary artery disease Father    Heart attack Father    Cancer Brother    Coronary artery disease Other        family hx    Past Surgical History:  Procedure Laterality Date   CATARACT EXTRACTION Right    CORONARY ARTERY BYPASS GRAFT     CYSTOSCOPY WITH INSERTION OF UROLIFT N/A 12/04/2020   Procedure: CYSTOSCOPY WITH INSERTION OF  UROLIFT;  Surgeon: Malen Gauze, MD;  Location: [facility];  Service: Urology;  Laterality: N/A;   Social History   Occupational History   Occupation: disabled - retired  Tobacco Use   Smoking status: Every Day  Current packs/day: 0.50    Average packs/day: 0.5 packs/day for 61.7 years (30.8 ttl pk-yrs)    Types: Cigarettes    Start date: 05/31/1961   Smokeless tobacco: Never  Vaping Use   Vaping status: Never Used  Substance and Sexual Activity   Alcohol use: Yes    Alcohol/week: 0.0 standard drinks of alcohol    Comment: occasionally   Drug use: No   Sexual activity: Not on file

## 2023-02-10 NOTE — H&P (Signed)
TOTAL KNEE ADMISSION H&P  Patient is being admitted for left total knee arthroplasty.  Subjective:  Chief Complaint:left knee pain.  HPI: Eddie BRANIGAN Sr., 72 y.o. male, has a history of pain and functional disability in the left knee due to arthritis and has failed non-surgical conservative treatments for greater than 12 weeks to includeNSAID's and/or analgesics, corticosteriod injections, and activity modification.  Onset of symptoms was gradual, starting 6 years ago with rapidlly worsening course since that time. The patient noted no past surgery on the left knee(s).  Patient currently rates pain in the left knee(s) at 8 out of 10 with activity. Patient has night pain, worsening of pain with activity and weight bearing, pain with passive range of motion, and crepitus.  Patient has evidence of subchondral cysts, periarticular osteophytes, and joint space narrowing by imaging studies. This patient has had    . There is no active infection.  Patient Active Problem List   Diagnosis Date Noted   Unilateral primary osteoarthritis, left knee 10/25/2022   Benign localized prostatic hyperplasia with lower urinary tract symptoms (LUTS) 09/11/2020   Urinary retention 09/11/2020   Stroke due to embolism of cerebral artery (HCC) 03/08/2015   Diabetic polyneuropathy 10/06/2013   Drug intolerance    Ringing in ear    Claudication [facility])    Carotid artery disease (HCC)    Dyslipidemia    Hypertension    CAD (coronary artery disease)    Allergy history, drug    Diabetes mellitus    Fibromyalgia    Arm pain    Ejection fraction    Tobacco abuse    S/P CABG x 1 09/18/2007   Past Medical History:  Diagnosis Date   Allergy history, drug    plavix   Anxiety    Arm pain    continued discomfort in his arm- seeing Dr. Cliffton Asters   Blind right eye    CAD (coronary artery disease)    catheterizzation. 11/10/07. patent LIMA to the LAD with competitive filling, patent SVG to diagonal that enters a  bifurcation point, occluded SVG to the distal marginal w/some collateralization, patent SVG to distal posterior lateral. medical therapy recommended.  EF 55% echo. June,2009. hypokinesis of the mid and distal septal wall. technically difficult study.   Carotid artery disease (HCC)    Doppler, July, 2012, 0-39% bilateral, followup 2 years   Cellulitis    s/p right lower extremity cellulitis   Claudication (HCC)    Arterial Dopplers, July, 2012, normal with normal ABI   Diabetes mellitus    type not specified   Drug intolerance    Patient is intolerant to both aspirin and Plavix.   Dyslipidemia    ED (erectile dysfunction)    Ejection fraction    EF 55%, echo, June, 2009, hypokinesis mid/distal septal wall, technically difficult study   Fibromyalgia    GERD (gastroesophageal reflux disease)    Glaucoma    right eye   H/O alcohol abuse    Alcohol abuse in the past   Hypertension    Polyneuropathy in diabetes(357.2) 10/06/2013   Psoriatic arthritis (HCC)    Ringing in ear    June, 2012 /  carotid Dopplers, in July, 2012, 0-39% bilateral   S/P CABG x 1 09/18/2007   2009   Stroke due to embolism of cerebral artery (HCC) 03/08/2015   Right MCA   Tobacco abuse     Past Surgical History:  Procedure Laterality Date   CATARACT EXTRACTION Right  CORONARY ARTERY BYPASS GRAFT     CYSTOSCOPY WITH INSERTION OF UROLIFT N/A 12/04/2020   Procedure: CYSTOSCOPY WITH INSERTION OF UROLIFT;  Surgeon: Malen Gauze, MD;  Location: [facility];  Service: Urology;  Laterality: N/A;    Current Outpatient Medications  Medication Sig Dispense Refill Last Dose   albuterol (VENTOLIN HFA) 108 (90 Base) MCG/ACT inhaler Inhale 2 puffs into the lungs 4 (four) times daily as needed for wheezing or shortness of breath.      ALPRAZolam (XANAX) 1 MG tablet Take 1 mg by mouth 3 (three) times daily as needed for anxiety or sleep.      amLODipine (NORVASC) 5 MG tablet Take 1 tablet (5 mg total) by mouth daily.       aspirin EC 81 MG tablet Take 81 mg by mouth daily. Swallow whole.      atorvastatin (LIPITOR) 20 MG tablet Take 20 mg by mouth at bedtime.      betamethasone dipropionate 0.05 % cream Apply 1 Application topically 2 (two) times daily as needed (irritation).      Cholecalciferol (VITAMIN D3) 50 MCG (2000 UT) capsule Take 2,000 Units by mouth daily.      clobetasol (OLUX) 0.05 % topical foam Apply 1 application  topically 2 (two) times daily as needed (psoriasis).      diclofenac sodium (VOLTAREN) 1 % GEL Apply 2 g topically 3 (three) times daily as needed (joint pain).      EPINEPHrine (PRIMATENE MIST) 0.125 MG/ACT AERO Inhale 1-2 Inhalations into the lungs every 4 (four) hours as needed (Asthma symptoms).      esomeprazole (NEXIUM) 40 MG capsule Take 40 mg by mouth daily as needed (acid reflux).      etanercept (ENBREL) 50 MG/ML injection Inject 50 mg into the skin once a week.      fluticasone (FLONASE) 50 MCG/ACT nasal spray Place 2 sprays into both nostrils daily as needed for allergies.      gabapentin (NEURONTIN) 800 MG tablet Take 800 mg by mouth 4 (four) times daily.      HYDROcodone-acetaminophen (NORCO) 10-325 MG tablet Take 1 tablet by mouth 4 (four) times daily.      ketoconazole (NIZORAL) 2 % cream Apply 1 Application topically 2 (two) times daily as needed for irritation.      metFORMIN (GLUCOPHAGE) 1000 MG tablet Take 1,000 mg by mouth 2 (two) times daily.      metoprolol succinate (TOPROL-XL) 25 MG 24 hr tablet TAKE 1/2 TABLET BY MOUTH EVERY DAY (pt needs APPOINTMENT FOR more refills) (Patient taking differently: Take 12.5 mg by mouth daily as needed (feel heart start racing).) 7 tablet 0    Multiple Vitamin (MULTIVITAMIN) tablet Take 1 tablet by mouth daily.      nitroGLYCERIN (NITROSTAT) 0.4 MG SL tablet Place 0.4 mg under the tongue every 5 (five) minutes as needed for chest pain.      sacubitril-valsartan (ENTRESTO) 24-26 MG TAKE 1 TABLET BY MOUTH TWICE DAILY (MUST SCHEDULE  APPOINTMENT FOR MORE REFILLS) 180 tablet 1    silodosin (RAPAFLO) 8 MG CAPS capsule TAKE ONE CAPSULE BY MOUTH DAILY WITH BREAKFAST (Patient not taking: Reported on 01/27/2023) 30 capsule 11    spironolactone (ALDACTONE) 25 MG tablet Take 1 tablet (25 mg total) by mouth daily. (Patient not taking: Reported on 01/27/2023) 90 tablet 2    No current facility-administered medications for this visit.   Allergies  Allergen Reactions   Plavix [Clopidogrel] Hives   Levaquin [Levofloxacin] Hives  Cymbalta [Duloxetine Hcl]     Headache, shortness of breath   Paxil [Paroxetine] Hives   Terbinafine Hcl Hives    Lamisil     Social History   Tobacco Use   Smoking status: Every Day    Current packs/day: 0.50    Average packs/day: 0.5 packs/day for 61.7 years (30.8 ttl pk-yrs)    Types: Cigarettes    Start date: 05/31/1961   Smokeless tobacco: Never  Substance Use Topics   Alcohol use: Yes    Alcohol/week: 0.0 standard drinks of alcohol    Comment: occasionally    Family History  Problem Relation Age of Onset   Dementia Mother    Diabetes Mother    Coronary artery disease Father    Heart attack Father    Cancer Brother    Coronary artery disease Other        family hx     Review of Systems  All other systems reviewed and are negative.   Objective:  Physical Exam  Objective: Vital Signs: Ht 5' 10.5" (1.791 m)   Wt 193 lb (87.5 kg)   BMI 27.30 kg/m    Physical Exam Constitutional:      Appearance: He is well-developed.  HENT:     Head: Normocephalic and atraumatic.     Right Ear: External ear normal.     Left Ear: External ear normal.  Eyes:     Pupils: Pupils are equal, round, and reactive to light.  Neck:     Thyroid: No thyromegaly.     Trachea: No tracheal deviation.  Cardiovascular:     Rate and Rhythm: Normal rate.  Pulmonary:     Effort: Pulmonary effort is normal.     Breath sounds: No wheezing.  Abdominal:     General: Bowel sounds are normal.      Palpations: Abdomen is soft.  Musculoskeletal:     Cervical back: Neck supple.  Skin:    General: Skin is warm and dry.     Capillary Refill: Capillary refill takes less than 2 seconds.  Neurological:     Mental Status: He is alert and oriented to person, place, and time.  Psychiatric:        Behavior: Behavior normal.        Thought Content: Thought content normal.        Judgment: Judgment normal.   Ortho Exam median sternotomy incision.  Short stride shuffle gait similar to Parkinson's without acceleration.  Negative logroll to hips.  Well-healed right midline knee incision.  Left knee has 20 degrees varus palpable pedal pulses negative logroll hips and medial laxity.  Vital signs in last 24 hours: @VSRANGES @  Labs:   Estimated body mass index is 26.32 kg/m as calculated from the following:   Height as of 12/06/22: 5\' 10"  (1.778 m).   Weight as of 12/06/22: 183 lb 6.4 oz (83.2 kg).   Imaging Review Plain radiographs demonstrate severe degenerative joint disease of the left knee(s). The overall alignment issignificant varus. The bone quality appears to be good for age and reported activity level.      Assessment/Plan:  End stage arthritis, left knee   The patient history, physical examination, clinical judgment of the provider and imaging studies are consistent with end stage degenerative joint disease of the left knee(s) and total knee arthroplasty is deemed medically necessary. The treatment options including medical management, injection therapy arthroscopy and arthroplasty were discussed at length. The risks and benefits of total knee arthroplasty were  presented and reviewed. The risks due to aseptic loosening, infection, stiffness, patella tracking problems, thromboembolic complications and other imponderables were discussed. The patient acknowledged the explanation, agreed to proceed with the plan and consent was signed. Patient is being admitted for inpatient treatment for  surgery, pain control, PT, OT, prophylactic antibiotics, VTE prophylaxis, progressive ambulation and ADL's and discharge planning. The patient is planning to be discharged home with home health services     Patient's anticipated LOS is less than 2 midnights, meeting these requirements: - Younger than 42 - Lives within 1 hour of care - Has a competent adult at home to recover with post-op recover - NO history of  - Chronic pain requiring opiods  - Diabetes  - Coronary Artery Disease  - Heart failure  - Heart attack  - Stroke  - DVT/VTE  - Cardiac arrhythmia  - Respiratory Failure/COPD  - Renal failure  - Anemia  - Advanced Liver disease

## 2023-02-10 NOTE — Progress Notes (Addendum)
PCP - [facility] Cardiologist - Dr. Wyline Mood  PPM/ICD - Denies Device Orders - n/a Rep Notified - n/a  Chest x-ray -  EKG - 12-06-22 Stress Test - 01-2019 ECHO - 08-23-21 Cardiac Cath - 11-10-2007  Sleep Study - denies CPAP - n/a  Fasting Blood Sugar - Per patient 85-102 in am.  Blood sugar 222 at PAT appointment reports eating a large breakfast/lunch Checks Blood Sugar 3-4 times a day    Blood Thinner Instructions:Denies Aspirin Instructions:n/a  ERAS Protcol - Clear liquids until 9:30 a.m. PRE-SURGERY  G2-   COVID TEST- n/a   Anesthesia review: Yes. Hx of DM, CABG and Stroke  Patient denies shortness of breath, fever, cough and chest pain at PAT appointment   All instructions explained to the patient, with a verbal understanding of the material. Patient agrees to go over the instructions while at home for a better understanding. Patient also instructed to self quarantine after being tested for COVID-19. The opportunity to ask questions was provided.

## 2023-02-11 NOTE — Progress Notes (Addendum)
Anesthesia Chart Review:  Case: [id number] Date/Time: 02/12/23 1215   Procedure: LEFT TOTAL KNEE ARTHROPLASTY (Left: Knee) - Needs RNFA   Anesthesia type: Spinal   Pre-op diagnosis: left knee osteoarthritis   [facility]: [facility] / [facility]   Surgeons: [name], MD       DISCUSSION: Patient is a 72 year old male scheduled for the above procedure.   History includes smoking, COPD, HTN, CAD (RCA & LAD stents 2003; anterolateral STEMI 10/05/07, s/p BMS LAD; s/p CABG: LIMA-LAD, SVG-DIAG, SVG-OM1, SVG-PLA 10/08/07; occluded SVG-OM1 with collateralization 11/10/07, medical therapy), HFmrEF, dyslipidemia, DM2 (with polyneuropathy), psoriatic arthritis, CVA (02/2015), fibromyalgia,  alcohol abuse ("in the past"; occasional alcohol use is currently documented), claudication, glaucoma (right eye), GERD, BPH (s/p UroLift 12/04/20).  Cardiology evaluation on 12/06/22 with Sharlene Dory, NP for CAD follow-up and preoperative evaluation. Last echo was on 08/23/21 showing LVEF 40-45%,akinesis of the mid-distal anterior septum, mid inferoseptal and apical inferior segments, and apex, lateral wall hypokinesis, mild asymmetric LVH of the septal segment, grade 1 diastolic dysfunction, trivial MR. 02/10/19 nuclear stress test showed no large ischemic territories, but large anteroseptal defect consistent with known prior infarct scar, EF 36%. He is allergic to Plavix. She wrote, "Mr. Lupe's perioperative risk of a major cardiac event is 11% according to the Revised Cardiac Risk Index (RCRI).  Therefore, he is at high risk for perioperative complications.   His functional capacity is fair at 4.31 METs according to the Duke Activity Status Index (DASI). Recommendations: According to ACC/AHA guidelines, no further cardiovascular testing needed.  The patient may proceed to surgery at acceptable risk.   Antiplatelet and/or Anticoagulation Recommendations: Okay to hold Aspirin 7 days prior to procedure and resume when felt  safe to do so and instructed by surgeon. Patient is not on any oral anticoagulant that needs to be held prior to surgery. Patient is aware of his high risk and is agreeable to proceed." She continued Entresto and Toprol for HFmrEF, but added Aldactone 25 mg daily, although currently listed as not taking. He was euvolemic on exam then. No anginal symptoms. Six month follow-up planned. ASA is listed as being on hold.   Anesthesia team to evaluate on the day of surgery. A1c 6.5%.   VS: BP 102/63   Pulse 77   Temp 36.7 C   Resp 17   Ht 5\' 10"  (1.778 m)   Wt 84.1 kg   SpO2 95%   BMI 26.60 kg/m    PROVIDERS: Practice, [facility] is PCP office Dina Rich, MD is cardiologist   LABS: Preoperative labs noted. Creatinine 1.39, previously 1.04 on 11/25/22 and 1.24 on 08/22/21. (all labs ordered are listed, but only abnormal results are displayed)  Labs Reviewed  GLUCOSE, CAPILLARY - Abnormal; Notable for the following components:      Result Value   Glucose-Capillary 222 (*)    All other components within normal limits  HEMOGLOBIN A1C - Abnormal; Notable for the following components:   Hgb A1c MFr Bld 6.5 (*)    All other components within normal limits  COMPREHENSIVE METABOLIC PANEL - Abnormal; Notable for the following components:   Glucose, Bld 183 (*)    Creatinine, Ser 1.39 (*)    Calcium 8.6 (*)    GFR, Estimated 54 (*)    All other components within normal limits  CBC - Abnormal; Notable for the following components:   WBC 11.1 (*)    All other components within normal limits  SURGICAL  PCR SCREEN    IMAGES: CT Lung Cancer Screening 10/10/22 [facility]): IMPRESSION: 1. Lung-RADS 2, benign appearance or behavior. Continue annual  screening with low-dose chest CT without contrast in 12 months.  2. Dilated 4.1 cm ascending thoracic aorta, which can be reassessed  on follow-up screening chest CT in 12 months.  3. One vessel coronary atherosclerosis.  4. Aortic  Atherosclerosis (ICD10-I70.0) and Emphysema (ICD10-J43.9).    EKG: 12/06/22: Normal sinus rhythm Left anterior fascicular block Cannot rule out Inferior infarct (masked by fascicular block?) , age undetermined Cannot rule out Anterior infarct , age undetermined T wave abnormality, consider lateral ischemia Confirmed by Sharlene Dory [phone]) on 12/10/2022 12:20:33 PM   CV: Echo 08/23/21: IMPRESSIONS   1. Left ventricular ejection fraction, by estimation, is 40 to 45%. The  left ventricle has mildly decreased function. The left ventricle  demonstrates regional wall motion abnormalities (see scoring  diagram/findings for description). There is mild  asymmetric left ventricular hypertrophy of the septal segment. Left  ventricular diastolic parameters are consistent with Grade I diastolic  dysfunction (impaired relaxation).   2. Right ventricular systolic function is normal. The right ventricular  size is normal. Tricuspid regurgitation signal is inadequate for assessing  PA pressure.   3. The mitral valve is grossly normal. Trivial mitral valve  regurgitation.   4. The aortic valve is tricuspid. Aortic valve regurgitation is not  visualized. No aortic stenosis is present. Aortic valve mean gradient  measures 3.0 mmHg.   5. Aortic dilatation noted. There is mild dilatation of the ascending  aorta, measuring 38 mm.   6. The inferior vena cava is normal in size with greater than 50%  respiratory variability, suggesting right atrial pressure of 3 mmHg.  Comparison(s): Prior images unable to be directly viewed.  - Comparison 02/18/15 [facility] LVEF 50-55%, anterior wall hypokinesis; 11/10/07: LVEF 55%, hypokinesis of the mid-distal septal wall, mild diastolic dysfunction   Nuclear stress test  02/10/19: No diagnostic ST segment changes over baseline. No significant arrhythmias. Large, moderate to severe intensity, anteroseptal defect most prominent at the apex but extending to the base largely  in the septal distribution. This region is fixed and consistent with prior infarct scar, no large ischemic territories. This is a high risk study based on infarct scar size and reduced LVEF. Nuclear stress EF: 36%.   Cardiac event monitor 03/14/15: Sinus rhythm No sustained arrhythmias No atrial fibrillation   US Carotid 02/18/15 ([facility]): IMPRESSION:  Bilateral ASVD. No significant stenosis.    Cardiac cath 11/10/07:  CONCLUSION:  1. Continued patency of the left internal mammary to the LAD, but with      evidence of what appears to be some competitive filling.  2. Patent saphenous vein graft to a diagonal that enters at a      bifurcation point.  3. Occluded saphenous vein graft to the distal marginal with at least      collateralization of what is likely the ramus and/or circumflex      itself.  4. Continued patency of the saphenous vein graft to the distal      posterolateral segment.    DISPOSITION:  At the present time, cardiac rehab and medical therapy  will be recommended.   Past Medical History:  Diagnosis Date   Allergy history, drug    plavix   Anxiety    Arm pain    continued discomfort in his arm- seeing Dr. Cliffton Asters   Blind right eye    CAD (coronary  artery disease)    catheterizzation. 11/10/07. patent LIMA to the LAD with competitive filling, patent SVG to diagonal that enters a bifurcation point, occluded SVG to the distal marginal w/some collateralization, patent SVG to distal posterior lateral. medical therapy recommended.  EF 55% echo. June,2009. hypokinesis of the mid and distal septal wall. technically difficult study.   Carotid artery disease (HCC)    Doppler, July, 2012, 0-39% bilateral, followup 2 years   Cellulitis    s/p right lower extremity cellulitis   Claudication (HCC)    Arterial Dopplers, July, 2012, normal with normal ABI   COPD (chronic obstructive pulmonary disease) (HCC)    Diabetes mellitus    type not specified   Drug  intolerance    Patient is intolerant to both aspirin and Plavix.   Dyslipidemia    ED (erectile dysfunction)    Ejection fraction    EF 55%, echo, June, 2009, hypokinesis mid/distal septal wall, technically difficult study   Fibromyalgia    GERD (gastroesophageal reflux disease)    Glaucoma    right eye   H/O alcohol abuse    Alcohol abuse in the past   Hypertension    Polyneuropathy in diabetes(357.2) 10/06/2013   Psoriatic arthritis (HCC)    Ringing in ear    June, 2012 /  carotid Dopplers, in July, 2012, 0-39% bilateral   S/P CABG x 1 09/18/2007   2009   Stroke due to embolism of cerebral artery (HCC) 03/08/2015   Right MCA   Tobacco abuse     Past Surgical History:  Procedure Laterality Date   CATARACT EXTRACTION Right    CORONARY ARTERY BYPASS GRAFT     CYSTOSCOPY WITH INSERTION OF UROLIFT N/A 12/04/2020   Procedure: CYSTOSCOPY WITH INSERTION OF UROLIFT;  Surgeon: Malen Gauze, MD;  Location: [facility];  Service: Urology;  Laterality: N/A;    MEDICATIONS:  albuterol (VENTOLIN HFA) 108 (90 Base) MCG/ACT inhaler   ALPRAZolam (XANAX) 1 MG tablet   amLODipine (NORVASC) 5 MG tablet   aspirin EC 81 MG tablet   betamethasone dipropionate 0.05 % cream   Cholecalciferol (VITAMIN D3) 50 MCG (2000 UT) capsule   clobetasol (OLUX) 0.05 % topical foam   EPINEPHrine (PRIMATENE MIST) 0.125 MG/ACT AERO   fluticasone (FLONASE) 50 MCG/ACT nasal spray   gabapentin (NEURONTIN) 800 MG tablet   HYDROcodone-acetaminophen (NORCO) 10-325 MG tablet   metFORMIN (GLUCOPHAGE) 1000 MG tablet   metoprolol succinate (TOPROL-XL) 25 MG 24 hr tablet   sacubitril-valsartan (ENTRESTO) 24-26 MG   atorvastatin (LIPITOR) 20 MG tablet   diclofenac sodium (VOLTAREN) 1 % GEL   esomeprazole (NEXIUM) 40 MG capsule   etanercept (ENBREL) 50 MG/ML injection   ketoconazole (NIZORAL) 2 % cream   Multiple Vitamin (MULTIVITAMIN) tablet   nitroGLYCERIN (NITROSTAT) 0.4 MG SL tablet   silodosin (RAPAFLO) 8 MG  CAPS capsule   spironolactone (ALDACTONE) 25 MG tablet   No current facility-administered medications for this encounter.    Shonna Chock, PA-C Surgical Short Stay/Anesthesiology [facility] Phone [phone] [facility] Phone [phone] 02/11/2023 10:20 AM

## 2023-02-11 NOTE — H&P (View-Only) (Signed)
Anesthesia Chart Review:  Case: [id number] Date/Time: 02/12/23 1215   Procedure: LEFT TOTAL KNEE ARTHROPLASTY (Left: Knee) - Needs RNFA   Anesthesia type: Spinal   Pre-op diagnosis: left knee osteoarthritis   [facility]   Surgeons: [name], MD       DISCUSSION: Patient is a 72 year old male scheduled for the above procedure.   History includes smoking, COPD, HTN, CAD (RCA & LAD stents 2003; anterolateral STEMI 10/05/07, s/p BMS LAD; s/p CABG: LIMA-LAD, SVG-DIAG, SVG-OM1, SVG-PLA 10/08/07; occluded SVG-OM1 with collateralization 11/10/07, medical therapy), HFmrEF, dyslipidemia, DM2 (with polyneuropathy), psoriatic arthritis, CVA (02/2015), fibromyalgia,  alcohol abuse ("in the past"; occasional alcohol use is currently documented), claudication, glaucoma (right eye), GERD, BPH (s/p UroLift 12/04/20).  Cardiology evaluation on 12/06/22 with Sharlene Dory, NP for CAD follow-up and preoperative evaluation. Last echo was on 08/23/21 showing LVEF 40-45%,akinesis of the mid-distal anterior septum, mid inferoseptal and apical inferior segments, and apex, lateral wall hypokinesis, mild asymmetric LVH of the septal segment, grade 1 diastolic dysfunction, trivial MR. 02/10/19 nuclear stress test showed no large ischemic territories, but large anteroseptal defect consistent with known prior infarct scar, EF 36%. He is allergic to Plavix. She wrote, "Mr. Lupe's perioperative risk of a major cardiac event is 11% according to the Revised Cardiac Risk Index (RCRI).  Therefore, he is at high risk for perioperative complications.   His functional capacity is fair at 4.31 METs according to the Duke Activity Status Index (DASI). Recommendations: According to ACC/AHA guidelines, no further cardiovascular testing needed.  The patient may proceed to surgery at acceptable risk.   Antiplatelet and/or Anticoagulation Recommendations: Okay to hold Aspirin 7 days prior to procedure and resume when felt  safe to do so and instructed by surgeon. Patient is not on any oral anticoagulant that needs to be held prior to surgery. Patient is aware of his high risk and is agreeable to proceed." She continued Entresto and Toprol for HFmrEF, but added Aldactone 25 mg daily, although currently listed as not taking. He was euvolemic on exam then. No anginal symptoms. Six month follow-up planned. ASA is listed as being on hold.   Anesthesia team to evaluate on the day of surgery. A1c 6.5%.   VS: BP 102/63   Pulse 77   Temp 36.7 C   Resp 17   Ht 5\' 10"  (1.778 m)   Wt 84.1 kg   SpO2 95%   BMI 26.60 kg/m    PROVIDERS: Practice, [facility] is PCP office Dina Rich, MD is cardiologist   LABS: Preoperative labs noted. Creatinine 1.39, previously 1.04 on 11/25/22 and 1.24 on 08/22/21. (all labs ordered are listed, but only abnormal results are displayed)  Labs Reviewed  GLUCOSE, CAPILLARY - Abnormal; Notable for the following components:      Result Value   Glucose-Capillary 222 (*)    All other components within normal limits  HEMOGLOBIN A1C - Abnormal; Notable for the following components:   Hgb A1c MFr Bld 6.5 (*)    All other components within normal limits  COMPREHENSIVE METABOLIC PANEL - Abnormal; Notable for the following components:   Glucose, Bld 183 (*)    Creatinine, Ser 1.39 (*)    Calcium 8.6 (*)    GFR, Estimated 54 (*)    All other components within normal limits  CBC - Abnormal; Notable for the following components:   WBC 11.1 (*)    All other components within normal limits  SURGICAL  PCR SCREEN    IMAGES: CT Lung Cancer Screening 10/10/22 [facility]): IMPRESSION: 1. Lung-RADS 2, benign appearance or behavior. Continue annual  screening with low-dose chest CT without contrast in 12 months.  2. Dilated 4.1 cm ascending thoracic aorta, which can be reassessed  on follow-up screening chest CT in 12 months.  3. One vessel coronary atherosclerosis.  4. Aortic  Atherosclerosis (ICD10-I70.0) and Emphysema (ICD10-J43.9).    EKG: 12/06/22: Normal sinus rhythm Left anterior fascicular block Cannot rule out Inferior infarct (masked by fascicular block?) , age undetermined Cannot rule out Anterior infarct , age undetermined T wave abnormality, consider lateral ischemia Confirmed by Sharlene Dory [phone]) on 12/10/2022 12:20:33 PM   CV: Echo 08/23/21: IMPRESSIONS   1. Left ventricular ejection fraction, by estimation, is 40 to 45%. The  left ventricle has mildly decreased function. The left ventricle  demonstrates regional wall motion abnormalities (see scoring  diagram/findings for description). There is mild  asymmetric left ventricular hypertrophy of the septal segment. Left  ventricular diastolic parameters are consistent with Grade I diastolic  dysfunction (impaired relaxation).   2. Right ventricular systolic function is normal. The right ventricular  size is normal. Tricuspid regurgitation signal is inadequate for assessing  PA pressure.   3. The mitral valve is grossly normal. Trivial mitral valve  regurgitation.   4. The aortic valve is tricuspid. Aortic valve regurgitation is not  visualized. No aortic stenosis is present. Aortic valve mean gradient  measures 3.0 mmHg.   5. Aortic dilatation noted. There is mild dilatation of the ascending  aorta, measuring 38 mm.   6. The inferior vena cava is normal in size with greater than 50%  respiratory variability, suggesting right atrial pressure of 3 mmHg.  Comparison(s): Prior images unable to be directly viewed.  - Comparison 02/18/15 [facility] LVEF 50-55%, anterior wall hypokinesis; 11/10/07: LVEF 55%, hypokinesis of the mid-distal septal wall, mild diastolic dysfunction   Nuclear stress test  02/10/19: No diagnostic ST segment changes over baseline. No significant arrhythmias. Large, moderate to severe intensity, anteroseptal defect most prominent at the apex but extending to the base largely  in the septal distribution. This region is fixed and consistent with prior infarct scar, no large ischemic territories. This is a high risk study based on infarct scar size and reduced LVEF. Nuclear stress EF: 36%.   Cardiac event monitor 03/14/15: Sinus rhythm No sustained arrhythmias No atrial fibrillation   US Carotid 02/18/15 ([facility]): IMPRESSION:  Bilateral ASVD. No significant stenosis.    Cardiac cath 11/10/07:  CONCLUSION:  1. Continued patency of the left internal mammary to the LAD, but with      evidence of what appears to be some competitive filling.  2. Patent saphenous vein graft to a diagonal that enters at a      bifurcation point.  3. Occluded saphenous vein graft to the distal marginal with at least      collateralization of what is likely the ramus and/or circumflex      itself.  4. Continued patency of the saphenous vein graft to the distal      posterolateral segment.    DISPOSITION:  At the present time, cardiac rehab and medical therapy  will be recommended.   Past Medical History:  Diagnosis Date   Allergy history, drug    plavix   Anxiety    Arm pain    continued discomfort in his arm- seeing Dr. Cliffton Asters   Blind right eye    CAD (coronary  artery disease)    catheterizzation. 11/10/07. patent LIMA to the LAD with competitive filling, patent SVG to diagonal that enters a bifurcation point, occluded SVG to the distal marginal w/some collateralization, patent SVG to distal posterior lateral. medical therapy recommended.  EF 55% echo. June,2009. hypokinesis of the mid and distal septal wall. technically difficult study.   Carotid artery disease (HCC)    Doppler, July, 2012, 0-39% bilateral, followup 2 years   Cellulitis    s/p right lower extremity cellulitis   Claudication (HCC)    Arterial Dopplers, July, 2012, normal with normal ABI   COPD (chronic obstructive pulmonary disease) (HCC)    Diabetes mellitus    type not specified   Drug  intolerance    Patient is intolerant to both aspirin and Plavix.   Dyslipidemia    ED (erectile dysfunction)    Ejection fraction    EF 55%, echo, June, 2009, hypokinesis mid/distal septal wall, technically difficult study   Fibromyalgia    GERD (gastroesophageal reflux disease)    Glaucoma    right eye   H/O alcohol abuse    Alcohol abuse in the past   Hypertension    Polyneuropathy in diabetes(357.2) 10/06/2013   Psoriatic arthritis (HCC)    Ringing in ear    June, 2012 /  carotid Dopplers, in July, 2012, 0-39% bilateral   S/P CABG x 1 09/18/2007   2009   Stroke due to embolism of cerebral artery (HCC) 03/08/2015   Right MCA   Tobacco abuse     Past Surgical History:  Procedure Laterality Date   CATARACT EXTRACTION Right    CORONARY ARTERY BYPASS GRAFT     CYSTOSCOPY WITH INSERTION OF UROLIFT N/A 12/04/2020   Procedure: CYSTOSCOPY WITH INSERTION OF UROLIFT;  Surgeon: Malen Gauze, MD;  Location: [facility];  Service: Urology;  Laterality: N/A;    MEDICATIONS:  albuterol (VENTOLIN HFA) 108 (90 Base) MCG/ACT inhaler   ALPRAZolam (XANAX) 1 MG tablet   amLODipine (NORVASC) 5 MG tablet   aspirin EC 81 MG tablet   betamethasone dipropionate 0.05 % cream   Cholecalciferol (VITAMIN D3) 50 MCG (2000 UT) capsule   clobetasol (OLUX) 0.05 % topical foam   EPINEPHrine (PRIMATENE MIST) 0.125 MG/ACT AERO   fluticasone (FLONASE) 50 MCG/ACT nasal spray   gabapentin (NEURONTIN) 800 MG tablet   HYDROcodone-acetaminophen (NORCO) 10-325 MG tablet   metFORMIN (GLUCOPHAGE) 1000 MG tablet   metoprolol succinate (TOPROL-XL) 25 MG 24 hr tablet   sacubitril-valsartan (ENTRESTO) 24-26 MG   atorvastatin (LIPITOR) 20 MG tablet   diclofenac sodium (VOLTAREN) 1 % GEL   esomeprazole (NEXIUM) 40 MG capsule   etanercept (ENBREL) 50 MG/ML injection   ketoconazole (NIZORAL) 2 % cream   Multiple Vitamin (MULTIVITAMIN) tablet   nitroGLYCERIN (NITROSTAT) 0.4 MG SL tablet   silodosin (RAPAFLO) 8 MG  CAPS capsule   spironolactone (ALDACTONE) 25 MG tablet   No current facility-administered medications for this encounter.    Shonna Chock, PA-C Surgical Short Stay/Anesthesiology [facility] Phone [phone] [facility] Phone [phone] 02/11/2023 10:20 AM

## 2023-02-11 NOTE — Anesthesia Preprocedure Evaluation (Signed)
Anesthesia Evaluation  Patient identified by MRN, date of birth, ID band Patient awake    Reviewed: Allergy & Precautions, NPO status , Patient's Chart, lab work & pertinent test results  History of Anesthesia Complications Negative for: history of anesthetic complications  Airway Mallampati: III  TM Distance: >3 FB Neck ROM: Full    Dental  (+) Edentulous Upper, Edentulous Lower, Dental Advisory Given   Pulmonary COPD,  COPD inhaler, neg recent URI, Current Smoker and Patient abstained from smoking.    + wheezing      Cardiovascular hypertension, Pt. on medications and Pt. on home beta blockers (-) angina + CAD and + CABG   Rhythm:Regular   1. Left ventricular ejection fraction, by estimation, is 40 to 45%. The  left ventricle has mildly decreased function. The left ventricle  demonstrates regional wall motion abnormalities (see scoring  diagram/findings for description). There is mild  asymmetric left ventricular hypertrophy of the septal segment. Left  ventricular diastolic parameters are consistent with Grade I diastolic  dysfunction (impaired relaxation).   2. Right ventricular systolic function is normal. The right ventricular  size is normal. Tricuspid regurgitation signal is inadequate for assessing  PA pressure.   3. The mitral valve is grossly normal. Trivial mitral valve  regurgitation.   4. The aortic valve is tricuspid. Aortic valve regurgitation is not  visualized. No aortic stenosis is present. Aortic valve mean gradient  measures 3.0 mmHg.   5. Aortic dilatation noted. There is mild dilatation of the ascending  aorta, measuring 38 mm.   6. The inferior vena cava is normal in size with greater than 50%  respiratory variability, suggesting right atrial pressure of 3 mmHg.      Neuro/Psych  PSYCHIATRIC DISORDERS Anxiety      Neuromuscular disease CVA, No Residual Symptoms    GI/Hepatic Neg liver ROS,GERD   Medicated and Controlled,,  Endo/Other  diabetes  Lab Results      Component                Value               Date                      HGBA1C                   6.5 (H)             02/10/2023             Renal/GU CRFRenal diseaseLab Results      Component                Value               Date                      CREATININE               1.39 (H)            02/10/2023                Musculoskeletal  (+) Arthritis ,  Fibromyalgia -  Abdominal   Peds  Hematology Lab Results      Component                Value               Date  WBC                      11.1 (H)            02/10/2023                HGB                      14.8                02/10/2023                HCT                      45.4                02/10/2023                MCV                      95.8                02/10/2023                PLT                      265                 02/10/2023             Ptt 38  Lab Results      Component                Value               Date                      INR                      1.0                 02/12/2023                INR                      1.0                 11/09/2007                INR                      1.2                 10/08/2007              Anesthesia Other Findings   Reproductive/Obstetrics                             Anesthesia Physical Anesthesia Plan  ASA: 3  Anesthesia Plan: MAC, Spinal and Regional   Post-op Pain Management: Regional block*   Induction: Intravenous  PONV Risk Score and Plan: 1 and Propofol infusion  Airway Management Planned: Nasal Cannula, Natural Airway and Simple Face Mask  Additional Equipment: None  Intra-op Plan:   Post-operative Plan:   Informed Consent: I have reviewed the patients History and Physical, chart, labs and discussed the  procedure including the risks, benefits and alternatives for the proposed anesthesia with the patient or  authorized representative who has indicated his/her understanding and acceptance.     Dental advisory given  Plan Discussed with: CRNA  Anesthesia Plan Comments: (PAT note written 02/11/2023 by Shonna Chock, PA-C.  )       Anesthesia Quick Evaluation

## 2023-02-12 ENCOUNTER — Encounter (HOSPITAL_COMMUNITY)
Admission: RE | Disposition: A | Discharge status: Skilled Nursing Facility | Payer: Self-pay | Source: Home / Self Care | Attending: Orthopaedic Surgery

## 2023-02-12 ENCOUNTER — Ambulatory Visit (HOSPITAL_COMMUNITY): Payer: 59 | Admitting: Vascular Surgery

## 2023-02-12 ENCOUNTER — Other Ambulatory Visit: Payer: Self-pay

## 2023-02-12 ENCOUNTER — Ambulatory Visit (HOSPITAL_COMMUNITY): Payer: 59 | Admitting: Registered Nurse

## 2023-02-12 ENCOUNTER — Encounter (HOSPITAL_COMMUNITY): Payer: Self-pay | Admitting: Orthopaedic Surgery

## 2023-02-12 ENCOUNTER — Inpatient Hospital Stay (HOSPITAL_COMMUNITY)
Admission: RE | Admit: 2023-02-12 | Discharge: 2023-02-20 | DRG: 470 | Disposition: A | Discharge status: Skilled Nursing Facility | Payer: 59 | Attending: Orthopaedic Surgery | Admitting: Orthopaedic Surgery

## 2023-02-12 DIAGNOSIS — M1712 Unilateral primary osteoarthritis, left knee: Secondary | ICD-10-CM

## 2023-02-12 DIAGNOSIS — I2510 Atherosclerotic heart disease of native coronary artery without angina pectoris: Secondary | ICD-10-CM | POA: Diagnosis present

## 2023-02-12 DIAGNOSIS — Z7982 Long term (current) use of aspirin: Secondary | ICD-10-CM

## 2023-02-12 DIAGNOSIS — L8996 Pressure-induced deep tissue damage of unspecified site: Secondary | ICD-10-CM | POA: Diagnosis not present

## 2023-02-12 DIAGNOSIS — L4050 Arthropathic psoriasis, unspecified: Secondary | ICD-10-CM | POA: Diagnosis present

## 2023-02-12 DIAGNOSIS — Z79899 Other long term (current) drug therapy: Secondary | ICD-10-CM

## 2023-02-12 DIAGNOSIS — I129 Hypertensive chronic kidney disease with stage 1 through stage 4 chronic kidney disease, or unspecified chronic kidney disease: Secondary | ICD-10-CM

## 2023-02-12 DIAGNOSIS — N189 Chronic kidney disease, unspecified: Secondary | ICD-10-CM | POA: Diagnosis not present

## 2023-02-12 DIAGNOSIS — M797 Fibromyalgia: Secondary | ICD-10-CM | POA: Diagnosis present

## 2023-02-12 DIAGNOSIS — E1151 Type 2 diabetes mellitus with diabetic peripheral angiopathy without gangrene: Secondary | ICD-10-CM | POA: Diagnosis present

## 2023-02-12 DIAGNOSIS — Z96652 Presence of left artificial knee joint: Principal | ICD-10-CM

## 2023-02-12 DIAGNOSIS — Z87891 Personal history of nicotine dependence: Secondary | ICD-10-CM

## 2023-02-12 DIAGNOSIS — L89326 Pressure-induced deep tissue damage of left buttock: Secondary | ICD-10-CM | POA: Diagnosis not present

## 2023-02-12 DIAGNOSIS — E785 Hyperlipidemia, unspecified: Secondary | ICD-10-CM | POA: Diagnosis present

## 2023-02-12 DIAGNOSIS — I444 Left anterior fascicular block: Secondary | ICD-10-CM | POA: Diagnosis present

## 2023-02-12 DIAGNOSIS — I5022 Chronic systolic (congestive) heart failure: Secondary | ICD-10-CM | POA: Diagnosis present

## 2023-02-12 DIAGNOSIS — Z881 Allergy status to other antibiotic agents status: Secondary | ICD-10-CM

## 2023-02-12 DIAGNOSIS — I252 Old myocardial infarction: Secondary | ICD-10-CM

## 2023-02-12 DIAGNOSIS — F17210 Nicotine dependence, cigarettes, uncomplicated: Secondary | ICD-10-CM

## 2023-02-12 DIAGNOSIS — I110 Hypertensive heart disease with heart failure: Secondary | ICD-10-CM | POA: Diagnosis present

## 2023-02-12 DIAGNOSIS — Z888 Allergy status to other drugs, medicaments and biological substances status: Secondary | ICD-10-CM

## 2023-02-12 DIAGNOSIS — K219 Gastro-esophageal reflux disease without esophagitis: Secondary | ICD-10-CM | POA: Diagnosis present

## 2023-02-12 DIAGNOSIS — E1142 Type 2 diabetes mellitus with diabetic polyneuropathy: Secondary | ICD-10-CM | POA: Diagnosis present

## 2023-02-12 DIAGNOSIS — H5461 Unqualified visual loss, right eye, normal vision left eye: Secondary | ICD-10-CM | POA: Diagnosis present

## 2023-02-12 DIAGNOSIS — H409 Unspecified glaucoma: Secondary | ICD-10-CM | POA: Diagnosis present

## 2023-02-12 DIAGNOSIS — L89316 Pressure-induced deep tissue damage of right buttock: Secondary | ICD-10-CM | POA: Diagnosis not present

## 2023-02-12 DIAGNOSIS — N400 Enlarged prostate without lower urinary tract symptoms: Secondary | ICD-10-CM | POA: Diagnosis present

## 2023-02-12 DIAGNOSIS — J449 Chronic obstructive pulmonary disease, unspecified: Secondary | ICD-10-CM | POA: Diagnosis present

## 2023-02-12 HISTORY — PX: TOTAL KNEE ARTHROPLASTY: SHX125

## 2023-02-12 LAB — GLUCOSE, CAPILLARY
Glucose-Capillary: 148 mg/dL — ABNORMAL HIGH (ref 70–99)
Glucose-Capillary: 133 mg/dL — ABNORMAL HIGH (ref 70–99)
Glucose-Capillary: 112 mg/dL — ABNORMAL HIGH (ref 70–99)
Glucose-Capillary: 121 mg/dL — ABNORMAL HIGH (ref 70–99)
Glucose-Capillary: 102 mg/dL — ABNORMAL HIGH (ref 70–99)

## 2023-02-12 LAB — PROTIME-INR
INR: 1 (ref 0.8–1.2)
Prothrombin Time: 13.5 seconds (ref 11.4–15.2)

## 2023-02-12 LAB — APTT: aPTT: 39 seconds — ABNORMAL HIGH (ref 24–36)

## 2023-02-12 SURGERY — ARTHROPLASTY, KNEE, TOTAL
Anesthesia: Monitor Anesthesia Care | Site: Knee | Laterality: Left

## 2023-02-12 MED ORDER — BUPIVACAINE HCL (PF) 0.25 % IJ SOLN
INTRAMUSCULAR | Status: AC
  Filled 2023-02-12: qty 30

## 2023-02-12 MED ORDER — DOCUSATE SODIUM 100 MG PO CAPS
100.0000 mg | ORAL_CAPSULE | Freq: Two times a day (BID) | ORAL | Status: DC
  Administered 2023-02-12 – 2023-02-20 (×16): 100 mg via ORAL
  Filled 2023-02-12 (×16): qty 1

## 2023-02-12 MED ORDER — PROPOFOL 500 MG/50ML IV EMUL
INTRAVENOUS | Status: DC | PRN
  Administered 2023-02-12: 50 ug/kg/min via INTRAVENOUS

## 2023-02-12 MED ORDER — CEFAZOLIN SODIUM-DEXTROSE 2-4 GM/100ML-% IV SOLN
2.0000 g | INTRAVENOUS | Status: AC
  Administered 2023-02-12: 2 g via INTRAVENOUS
  Filled 2023-02-12: qty 100

## 2023-02-12 MED ORDER — FLUTICASONE PROPIONATE 50 MCG/ACT NA SUSP: 2.0000 | Freq: Every day | NASAL | Status: DC | PRN

## 2023-02-12 MED ORDER — PHENYLEPHRINE 80 MCG/ML (10ML) SYRINGE FOR IV PUSH (FOR BLOOD PRESSURE SUPPORT)
PREFILLED_SYRINGE | INTRAVENOUS | Status: DC | PRN
  Administered 2023-02-12 (×2): 80 ug via INTRAVENOUS

## 2023-02-12 MED ORDER — PHENYLEPHRINE 80 MCG/ML (10ML) SYRINGE FOR IV PUSH (FOR BLOOD PRESSURE SUPPORT)
PREFILLED_SYRINGE | INTRAVENOUS | Status: AC
  Filled 2023-02-12: qty 10

## 2023-02-12 MED ORDER — METFORMIN HCL 500 MG PO TABS
1000.0000 mg | ORAL_TABLET | Freq: Two times a day (BID) | ORAL | Status: DC
  Administered 2023-02-13 – 2023-02-20 (×15): 1000 mg via ORAL
  Filled 2023-02-12 (×15): qty 2

## 2023-02-12 MED ORDER — PHENYLEPHRINE HCL-NACL 20-0.9 MG/250ML-% IV SOLN
INTRAVENOUS | Status: DC | PRN
  Administered 2023-02-12: 50 ug/min via INTRAVENOUS

## 2023-02-12 MED ORDER — BUPIVACAINE LIPOSOME 1.3 % IJ SUSP
INTRAMUSCULAR | Status: AC
  Filled 2023-02-12: qty 20

## 2023-02-12 MED ORDER — BUPIVACAINE LIPOSOME 1.3 % IJ SUSP
INTRAMUSCULAR | Status: DC | PRN
  Administered 2023-02-12: 20 mL

## 2023-02-12 MED ORDER — ACETAMINOPHEN 10 MG/ML IV SOLN: 1000.0000 mg | Freq: Once | INTRAVENOUS | Status: DC | PRN

## 2023-02-12 MED ORDER — PHENOL 1.4 % MT LIQD: 1.0000 | OROMUCOSAL | Status: DC | PRN

## 2023-02-12 MED ORDER — DIPHENHYDRAMINE HCL 12.5 MG/5ML PO ELIX
12.5000 mg | ORAL_SOLUTION | ORAL | Status: DC | PRN
  Administered 2023-02-20: 25 mg via ORAL
  Filled 2023-02-12: qty 10

## 2023-02-12 MED ORDER — HYDROMORPHONE HCL 1 MG/ML IJ SOLN
1.0000 mg | INTRAMUSCULAR | Status: DC | PRN
  Administered 2023-02-12 – 2023-02-14 (×4): 1 mg via INTRAVENOUS
  Filled 2023-02-12 (×4): qty 1

## 2023-02-12 MED ORDER — ACETAMINOPHEN 500 MG PO TABS: 1000.0000 mg | ORAL_TABLET | Freq: Once | ORAL | Status: DC | PRN

## 2023-02-12 MED ORDER — ONDANSETRON HCL 4 MG PO TABS: 4.0000 mg | ORAL_TABLET | Freq: Four times a day (QID) | ORAL | Status: DC | PRN

## 2023-02-12 MED ORDER — ADULT MULTIVITAMIN W/MINERALS CH
1.0000 | ORAL_TABLET | Freq: Every day | ORAL | Status: DC
  Administered 2023-02-12 – 2023-02-20 (×9): 1 via ORAL
  Filled 2023-02-12 (×9): qty 1

## 2023-02-12 MED ORDER — LIDOCAINE 2% (20 MG/ML) 5 ML SYRINGE
INTRAMUSCULAR | Status: AC
  Filled 2023-02-12: qty 5

## 2023-02-12 MED ORDER — INSULIN ASPART 100 UNIT/ML IJ SOLN: 0.0000 [IU] | INTRAMUSCULAR | Status: DC | PRN

## 2023-02-12 MED ORDER — FERROUS SULFATE 325 (65 FE) MG PO TABS
325.0000 mg | ORAL_TABLET | Freq: Three times a day (TID) | ORAL | Status: DC
  Administered 2023-02-13 – 2023-02-20 (×20): 325 mg via ORAL
  Filled 2023-02-12 (×22): qty 1

## 2023-02-12 MED ORDER — LACTATED RINGERS IV SOLN: INTRAVENOUS | Status: DC

## 2023-02-12 MED ORDER — OXYCODONE HCL ER 10 MG PO T12A
10.0000 mg | EXTENDED_RELEASE_TABLET | Freq: Two times a day (BID) | ORAL | Status: DC
  Administered 2023-02-12 – 2023-02-14 (×4): 10 mg via ORAL
  Filled 2023-02-12 (×4): qty 1

## 2023-02-12 MED ORDER — PROPOFOL 10 MG/ML IV BOLUS
INTRAVENOUS | Status: AC
  Filled 2023-02-12: qty 20

## 2023-02-12 MED ORDER — LIDOCAINE 2% (20 MG/ML) 5 ML SYRINGE
INTRAMUSCULAR | Status: DC | PRN
  Administered 2023-02-12: 40 mg via INTRAVENOUS

## 2023-02-12 MED ORDER — ALBUTEROL SULFATE (2.5 MG/3ML) 0.083% IN NEBU
2.5000 mg | INHALATION_SOLUTION | Freq: Four times a day (QID) | RESPIRATORY_TRACT | Status: DC | PRN
  Administered 2023-02-14: 2.5 mg via RESPIRATORY_TRACT
  Filled 2023-02-12: qty 3

## 2023-02-12 MED ORDER — FENTANYL CITRATE (PF) 250 MCG/5ML IJ SOLN
INTRAMUSCULAR | Status: AC
  Filled 2023-02-12: qty 5

## 2023-02-12 MED ORDER — ACETAMINOPHEN 160 MG/5ML PO SOLN: 1000.0000 mg | Freq: Once | ORAL | Status: DC | PRN

## 2023-02-12 MED ORDER — FENTANYL CITRATE (PF) 100 MCG/2ML IJ SOLN: 25.0000 ug | INTRAMUSCULAR | Status: DC | PRN

## 2023-02-12 MED ORDER — METOPROLOL SUCCINATE ER 25 MG PO TB24: 12.5000 mg | ORAL_TABLET | Freq: Every day | ORAL | Status: DC | PRN

## 2023-02-12 MED ORDER — TAMSULOSIN HCL 0.4 MG PO CAPS
0.4000 mg | ORAL_CAPSULE | Freq: Every day | ORAL | Status: DC
  Administered 2023-02-12 – 2023-02-20 (×9): 0.4 mg via ORAL
  Filled 2023-02-12 (×9): qty 1

## 2023-02-12 MED ORDER — MIDAZOLAM HCL 2 MG/2ML IJ SOLN
INTRAMUSCULAR | Status: AC
  Filled 2023-02-12: qty 2

## 2023-02-12 MED ORDER — OXYCODONE HCL 5 MG PO TABS
10.0000 mg | ORAL_TABLET | ORAL | Status: DC | PRN
  Administered 2023-02-12 – 2023-02-14 (×4): 15 mg via ORAL
  Filled 2023-02-12 (×4): qty 3

## 2023-02-12 MED ORDER — ASPIRIN 325 MG PO TBEC
325.0000 mg | DELAYED_RELEASE_TABLET | Freq: Every day | ORAL | Status: DC
  Administered 2023-02-13 – 2023-02-20 (×8): 325 mg via ORAL
  Filled 2023-02-12 (×8): qty 1

## 2023-02-12 MED ORDER — OXYCODONE HCL 5 MG/5ML PO SOLN: 5.0000 mg | Freq: Once | ORAL | Status: DC | PRN

## 2023-02-12 MED ORDER — KETOCONAZOLE 2 % EX CREA: 1.0000 | TOPICAL_CREAM | Freq: Two times a day (BID) | CUTANEOUS | Status: DC | PRN

## 2023-02-12 MED ORDER — NITROGLYCERIN 0.4 MG SL SUBL: 0.4000 mg | SUBLINGUAL_TABLET | SUBLINGUAL | Status: DC | PRN

## 2023-02-12 MED ORDER — AMLODIPINE BESYLATE 5 MG PO TABS
5.0000 mg | ORAL_TABLET | Freq: Every day | ORAL | Status: DC
  Administered 2023-02-13 – 2023-02-20 (×8): 5 mg via ORAL
  Filled 2023-02-12 (×8): qty 1

## 2023-02-12 MED ORDER — BISACODYL 10 MG RE SUPP: 10.0000 mg | Freq: Every day | RECTAL | Status: DC | PRN

## 2023-02-12 MED ORDER — EPHEDRINE SULFATE-NACL 50-0.9 MG/10ML-% IV SOSY
PREFILLED_SYRINGE | INTRAVENOUS | Status: DC | PRN
  Administered 2023-02-12 (×3): 5 mg via INTRAVENOUS

## 2023-02-12 MED ORDER — BUPIVACAINE HCL (PF) 0.25 % IJ SOLN
INTRAMUSCULAR | Status: DC | PRN
  Administered 2023-02-12: 20 mL

## 2023-02-12 MED ORDER — GABAPENTIN 400 MG PO CAPS
800.0000 mg | ORAL_CAPSULE | Freq: Four times a day (QID) | ORAL | Status: DC
  Administered 2023-02-12 – 2023-02-15 (×9): 800 mg via ORAL
  Filled 2023-02-12 (×10): qty 2

## 2023-02-12 MED ORDER — ALPRAZOLAM 0.5 MG PO TABS
1.0000 mg | ORAL_TABLET | Freq: Three times a day (TID) | ORAL | Status: DC | PRN
  Administered 2023-02-12 – 2023-02-19 (×11): 1 mg via ORAL
  Filled 2023-02-12 (×11): qty 2

## 2023-02-12 MED ORDER — TRIAMCINOLONE ACETONIDE 0.5 % EX CREA
TOPICAL_CREAM | Freq: Two times a day (BID) | CUTANEOUS | Status: DC
  Administered 2023-02-17 – 2023-02-20 (×4): 1 via TOPICAL
  Filled 2023-02-12: qty 15

## 2023-02-12 MED ORDER — FENTANYL CITRATE (PF) 100 MCG/2ML IJ SOLN: 50.0000 ug | Freq: Once | INTRAMUSCULAR | Status: AC

## 2023-02-12 MED ORDER — ORAL CARE MOUTH RINSE: 15.0000 mL | Freq: Once | OROMUCOSAL | Status: AC

## 2023-02-12 MED ORDER — MENTHOL 3 MG MT LOZG: 1.0000 | LOZENGE | OROMUCOSAL | Status: DC | PRN

## 2023-02-12 MED ORDER — OXYCODONE HCL 5 MG PO TABS: 5.0000 mg | ORAL_TABLET | Freq: Once | ORAL | Status: DC | PRN

## 2023-02-12 MED ORDER — PANTOPRAZOLE SODIUM 40 MG PO TBEC
80.0000 mg | DELAYED_RELEASE_TABLET | Freq: Every day | ORAL | Status: DC
  Administered 2023-02-14 – 2023-02-20 (×7): 80 mg via ORAL
  Filled 2023-02-12 (×8): qty 2

## 2023-02-12 MED ORDER — CLOBETASOL PROPIONATE 0.05 % EX CREA: 1.0000 | TOPICAL_CREAM | Freq: Two times a day (BID) | CUTANEOUS | Status: DC | PRN

## 2023-02-12 MED ORDER — CHLORHEXIDINE GLUCONATE 0.12 % MT SOLN
15.0000 mL | Freq: Once | OROMUCOSAL | Status: AC
  Administered 2023-02-12: 15 mL via OROMUCOSAL
  Filled 2023-02-12: qty 15

## 2023-02-12 MED ORDER — FENTANYL CITRATE (PF) 100 MCG/2ML IJ SOLN
INTRAMUSCULAR | Status: AC
  Administered 2023-02-12: 50 ug via INTRAVENOUS
  Filled 2023-02-12: qty 2

## 2023-02-12 MED ORDER — SODIUM CHLORIDE 0.9 % IR SOLN
Status: DC | PRN
  Administered 2023-02-12 (×2): 1000 mL

## 2023-02-12 MED ORDER — ACETAMINOPHEN 325 MG PO TABS
325.0000 mg | ORAL_TABLET | Freq: Four times a day (QID) | ORAL | Status: DC | PRN
  Administered 2023-02-13 – 2023-02-20 (×4): 650 mg via ORAL
  Filled 2023-02-12 (×4): qty 2

## 2023-02-12 MED ORDER — SODIUM CHLORIDE 0.9 % IV SOLN: INTRAVENOUS | Status: DC

## 2023-02-12 MED ORDER — METOCLOPRAMIDE HCL 5 MG/ML IJ SOLN: 5.0000 mg | Freq: Three times a day (TID) | INTRAMUSCULAR | Status: DC | PRN

## 2023-02-12 MED ORDER — ATORVASTATIN CALCIUM 10 MG PO TABS
20.0000 mg | ORAL_TABLET | Freq: Every day | ORAL | Status: DC
  Administered 2023-02-12 – 2023-02-19 (×8): 20 mg via ORAL
  Filled 2023-02-12 (×8): qty 2

## 2023-02-12 MED ORDER — ONDANSETRON HCL 4 MG/2ML IJ SOLN: 4.0000 mg | Freq: Four times a day (QID) | INTRAMUSCULAR | Status: DC | PRN

## 2023-02-12 MED ORDER — SPIRONOLACTONE 25 MG PO TABS
25.0000 mg | ORAL_TABLET | Freq: Every day | ORAL | Status: DC
  Administered 2023-02-12 – 2023-02-20 (×9): 25 mg via ORAL
  Filled 2023-02-12 (×9): qty 1

## 2023-02-12 MED ORDER — METOCLOPRAMIDE HCL 5 MG PO TABS: 5.0000 mg | ORAL_TABLET | Freq: Three times a day (TID) | ORAL | Status: DC | PRN

## 2023-02-12 MED ORDER — OXYCODONE HCL 5 MG PO TABS
5.0000 mg | ORAL_TABLET | ORAL | Status: DC | PRN
  Administered 2023-02-13 – 2023-02-15 (×2): 5 mg via ORAL
  Filled 2023-02-12 (×3): qty 1

## 2023-02-12 MED ORDER — BUPIVACAINE IN DEXTROSE 0.75-8.25 % IT SOLN
INTRATHECAL | Status: DC | PRN
Start: 2023-02-12 — End: 2023-02-12
  Administered 2023-02-12: 1.8 mL via INTRATHECAL

## 2023-02-12 MED ORDER — VITAMIN D 25 MCG (1000 UNIT) PO TABS
2000.0000 [IU] | ORAL_TABLET | Freq: Every day | ORAL | Status: DC
  Administered 2023-02-12 – 2023-02-20 (×9): 2000 [IU] via ORAL
  Filled 2023-02-12 (×9): qty 2

## 2023-02-12 MED ORDER — TRANEXAMIC ACID-NACL 1000-0.7 MG/100ML-% IV SOLN
1000.0000 mg | INTRAVENOUS | Status: AC
  Administered 2023-02-12: 1000 mg via INTRAVENOUS
  Filled 2023-02-12: qty 100

## 2023-02-12 MED ORDER — EPHEDRINE 5 MG/ML INJ
INTRAVENOUS | Status: AC
  Filled 2023-02-12: qty 5

## 2023-02-12 SURGICAL SUPPLY — 76 items
ATTUNE MED DOME PAT 38 KNEE (Knees) IMPLANT
ATTUNE PS FEM LT SZ 6 CEM KNEE (Femur) IMPLANT
ATTUNE PSRP INSR SZ6 5 KNEE (Insert) IMPLANT
BAG COUNTER SPONGE SURGICOUNT (BAG) ×1 IMPLANT
BAG SPNG CNTER NS LX DISP (BAG) ×1
BANDAGE ESMARK 6X9 LF (GAUZE/BANDAGES/DRESSINGS) ×1 IMPLANT
BASE TIBIA ATTUNE KNEE SYS SZ6 (Knees) IMPLANT
BLADE SAGITTAL 25.0X1.19X90 (BLADE) ×1 IMPLANT
BLADE SAW SGTL 13X75X1.27 (BLADE) ×1 IMPLANT
BLADE SURG 15 STRL LF DISP TIS (BLADE) IMPLANT
BLADE SURG 15 STRL SS (BLADE) ×1
BNDG CMPR 9X6 STRL LF SNTH (GAUZE/BANDAGES/DRESSINGS) ×1
BNDG CMPR MED 10X6 ELC LF (GAUZE/BANDAGES/DRESSINGS) ×1
BNDG CMPR STD VLCR NS LF 5.8X4 (GAUZE/BANDAGES/DRESSINGS) ×1
BNDG ELASTIC 4X5.8 VLCR NS LF (GAUZE/BANDAGES/DRESSINGS) ×1 IMPLANT
BNDG ELASTIC 4X5.8 VLCR STR LF (GAUZE/BANDAGES/DRESSINGS) ×1 IMPLANT
BNDG ELASTIC 6X10 VLCR STRL LF (GAUZE/BANDAGES/DRESSINGS) ×1 IMPLANT
BNDG ESMARK 6X9 LF (GAUZE/BANDAGES/DRESSINGS) ×1
BOWL SMART MIX CTS (DISPOSABLE) ×1 IMPLANT
BSPLAT TIB 6 CMNT ROT PLAT STR (Knees) ×1 IMPLANT
CEMENT HV SMART SET (Cement) ×2 IMPLANT
COVER SURGICAL LIGHT HANDLE (MISCELLANEOUS) ×1 IMPLANT
CUFF TOURN SGL QUICK 34 (TOURNIQUET CUFF) ×1
CUFF TOURN SGL QUICK 42 (TOURNIQUET CUFF) IMPLANT
CUFF TRNQT CYL 34X4.125X (TOURNIQUET CUFF) ×1 IMPLANT
DRAPE ORTHO SPLIT 77X108 STRL (DRAPES) ×2
DRAPE SURG ORHT 6 SPLT 77X108 (DRAPES) ×2 IMPLANT
DRAPE U-SHAPE 47X51 STRL (DRAPES) ×1 IMPLANT
DURAPREP 26ML APPLICATOR (WOUND CARE) ×2 IMPLANT
ELECT REM PT RETURN 9FT ADLT (ELECTROSURGICAL) ×1
ELECTRODE REM PT RTRN 9FT ADLT (ELECTROSURGICAL) ×1 IMPLANT
FACESHIELD WRAPAROUND (MASK) ×2 IMPLANT
FACESHIELD WRAPAROUND OR TEAM (MASK) ×2 IMPLANT
GAUZE PAD ABD 8X10 STRL (GAUZE/BANDAGES/DRESSINGS) ×1 IMPLANT
GAUZE SPONGE 4X4 12PLY STRL (GAUZE/BANDAGES/DRESSINGS) IMPLANT
GAUZE XEROFORM 5X9 LF (GAUZE/BANDAGES/DRESSINGS) ×1 IMPLANT
GLOVE BIOGEL PI IND STRL 8 (GLOVE) ×2 IMPLANT
GLOVE ORTHO TXT STRL SZ7.5 (GLOVE) ×2 IMPLANT
GOWN STRL REUS W/ TWL LRG LVL3 (GOWN DISPOSABLE) ×1 IMPLANT
GOWN STRL REUS W/ TWL XL LVL3 (GOWN DISPOSABLE) ×1 IMPLANT
GOWN STRL REUS W/TWL 2XL LVL3 (GOWN DISPOSABLE) ×1 IMPLANT
GOWN STRL REUS W/TWL LRG LVL3 (GOWN DISPOSABLE) ×1
GOWN STRL REUS W/TWL XL LVL3 (GOWN DISPOSABLE) ×1
HANDPIECE INTERPULSE COAX TIP (DISPOSABLE) ×1
IMMOBILIZER KNEE 20 (SOFTGOODS) ×1
IMMOBILIZER KNEE 20 THIGH 36 (SOFTGOODS) IMPLANT
IMMOBILIZER KNEE 22 UNIV (SOFTGOODS) ×1 IMPLANT
KIT BASIN OR (CUSTOM PROCEDURE TRAY) ×1 IMPLANT
KIT TURNOVER KIT B (KITS) ×1 IMPLANT
MANIFOLD NEPTUNE II (INSTRUMENTS) ×1 IMPLANT
MARKER SKIN DUAL TIP RULER LAB (MISCELLANEOUS) ×1 IMPLANT
NDL 18GX1X1/2 (RX/OR ONLY) (NEEDLE) ×1 IMPLANT
NDL HYPO 25GX1X1/2 BEV (NEEDLE) ×1 IMPLANT
NEEDLE 18GX1X1/2 (RX/OR ONLY) (NEEDLE) ×1 IMPLANT
NEEDLE HYPO 25GX1X1/2 BEV (NEEDLE) IMPLANT
NS IRRIG 1000ML POUR BTL (IV SOLUTION) ×1 IMPLANT
PACK TOTAL JOINT (CUSTOM PROCEDURE TRAY) ×1 IMPLANT
PAD ARMBOARD 7.5X6 YLW CONV (MISCELLANEOUS) ×2 IMPLANT
PADDING CAST ABS COTTON 6X4 NS (CAST SUPPLIES) IMPLANT
PADDING CAST COTTON 6X4 STRL (CAST SUPPLIES) ×1 IMPLANT
PIN STEINMAN FIXATION KNEE (PIN) IMPLANT
SET HNDPC FAN SPRY TIP SCT (DISPOSABLE) ×1 IMPLANT
STAPLER VISISTAT 35W (STAPLE) IMPLANT
SUCTION TUBE FRAZIER 10FR DISP (SUCTIONS) ×1 IMPLANT
SUT VIC AB 0 CT1 27 (SUTURE) ×1
SUT VIC AB 0 CT1 27XBRD ANBCTR (SUTURE) ×1 IMPLANT
SUT VIC AB 1 CTX 36 (SUTURE) ×2
SUT VIC AB 1 CTX36XBRD ANBCTR (SUTURE) ×2 IMPLANT
SUT VIC AB 2-0 CT1 27 (SUTURE) ×2
SUT VIC AB 2-0 CT1 TAPERPNT 27 (SUTURE) ×2 IMPLANT
SYR 50ML LL SCALE MARK (SYRINGE) ×1 IMPLANT
SYR CONTROL 10ML LL (SYRINGE) ×1 IMPLANT
TIBIA ATTUNE KNEE SYS BASE SZ6 (Knees) ×1 IMPLANT
TOWEL GREEN STERILE (TOWEL DISPOSABLE) ×1 IMPLANT
TOWEL GREEN STERILE FF (TOWEL DISPOSABLE) ×1 IMPLANT
TRAY CATH INTERMITTENT SS 16FR (CATHETERS) IMPLANT

## 2023-02-12 NOTE — Anesthesia Postprocedure Evaluation (Signed)
Anesthesia Post Note  Patient: GEOGE DOUBLE Sr.  Procedure(s) Performed: LEFT TOTAL KNEE ARTHROPLASTY (Left: Knee)     Patient location during evaluation: PACU Anesthesia Type: Regional and Spinal Level of consciousness: awake Pain management: pain level controlled Vital Signs Assessment: post-procedure vital signs reviewed and stable Respiratory status: spontaneous breathing, nonlabored ventilation and respiratory function stable Cardiovascular status: blood pressure returned to baseline and stable Postop Assessment: no apparent nausea or vomiting Anesthetic complications: no   No notable events documented.  Last Vitals:  Vitals:   02/12/23 1930 02/12/23 1959  BP: 134/75 128/71  Pulse: 70 67  Resp: 17   Temp: (!) 36.3 C   SpO2: 93% 97%    Last Pain:  Vitals:   02/12/23 2240  TempSrc:   PainSc: 10-Worst pain ever                 Toniann Dickerson P Adamari Frede

## 2023-02-12 NOTE — Anesthesia Procedure Notes (Signed)
Spinal  Staffing Performed by: Alwyn Ren, CRNA Authorized by: Paxton Nation, MD

## 2023-02-12 NOTE — Transfer of Care (Signed)
Immediate Anesthesia Transfer of Care Note  Patient: Eddie BRACKENS Sr.  Procedure(s) Performed: LEFT TOTAL KNEE ARTHROPLASTY (Left: Knee)  Patient Location: PACU  Anesthesia Type:MAC and Spinal  Level of Consciousness: awake and alert   Airway & Oxygen Therapy: Patient Spontanous Breathing and Patient connected to face mask oxygen  Post-op Assessment: Report given to RN and Post -op Vital signs reviewed and stable  Post vital signs: Reviewed and stable  Last Vitals:  Vitals Value Taken Time  BP 110/61 02/12/23 1915  Temp    Pulse 70 02/12/23 1918  Resp 15 02/12/23 1918  SpO2 94 % 02/12/23 1918  Vitals shown include unfiled device data.  Last Pain:  Vitals:   02/12/23 1123  TempSrc:   PainSc: 3       Patients Stated Pain Goal: 4 (02/12/23 1123)  Complications: No notable events documented.

## 2023-02-12 NOTE — Op Note (Addendum)
Pre and postop diagnosis: Left knee osteoarthritis with varus deformity.  Procedure: Left total knee arthroplasty  Surgeon: Annell Greening, MD  Assistant: Georgann Housekeeper, RNFA RNFA was medically necessary and required for the entire procedure for exposure and placement of trials and  prosthesis.  Anesthesia preoperative adductor block plus spinal anesthesia plus Exparel and Marcaine 20+20.  Tourniquet 300 x 63 minutes.  Implants:CEMENT HV SMART SET - [id number]  Inventory Item: CEMENT HV SMART SET Serial no.: Model/Cat no.: [id number]  Implant name: CEMENT HV SMART SET - [id number] Laterality: Left Area: Knee  Manufacturer: [employer] Date of Manufacture:   Action: Implanted Number Used: 2   Device Identifier: Device Identifier Type:   ATTUNE PSRP INSR SZ6 5 KNEE - [id number]  Inventory Item: ATTUNE PSRP INSR SZ6 5 KNEE Serial no.: Model/Cat no.: [id number]  Implant name: ATTUNE PSRP INSR SZ6 5 KNEE - [id number] Laterality: Left Area: Knee  Manufacturer: [employer] Date of Manufacture:   Action: Implanted Number Used: 1   Device Identifier: Device Identifier Type:   ATTUNE MED DOME PAT 38 KNEE - [id number]  Inventory Item: ATTUNE MED DOME PAT 38 KNEE Serial no.: Model/Cat no.: [id number]  Implant name: ATTUNE MED DOME PAT 38 KNEE - [id number] Laterality: Left Area: Knee  Manufacturer: [employer] Date of Manufacture:   Action: Implanted Number Used: 1   Device Identifier: Device Identifier Type:   BSPLAT TIB 6 CMNT ROT PLAT STR - [id number]  Inventory Item: BSPLAT TIB 6 CMNT ROT PLAT STR Serial no.: Model/Cat no.: [id number]  Implant name: BSPLAT TIB 6 CMNT ROT PLAT STR - [id number] Laterality: Left Area: Knee  Manufacturer: [employer] Date of Manufacture:   Action: Implanted Number Used: 1   Device Identifier: Device Identifier Type:   ATTUNE PS FEM LT SZ 6 CEM KNEE - [id number]   Inventory Item: ATTUNE PS FEM LT SZ 6 CEM KNEE Serial no.: Model/Cat no.:  [id number]  Implant name: ATTUNE PS FEM LT SZ 6 CEM KNEE - [id number] Laterality: Left Area: Knee  Manufacturer: [employer] Date of Manufacture:   Action: Implanted Number Used: 1   Device Identifier: Device Identifier Type:   Procedure: After induction of spinal anesthesia with preoperative adductor block supine position proximal thigh tourniquet heel bump lateral post tourniquet set at 300 DuraPrep the usual arthroscopic sheets draped sterile skin marker impervious stockinette Coban Betadine Steri-Drape timeout procedure IV TXA IV Ancef and timeout patient has right leg wrapped with an Esmarch and tourniquet was inflated.  Midline incision was made medial parapatellar incision after marking the capsule with the skin for later closure patella everted 10 mm resected.  There was large osteophytes on the femur medial lateral bone-on-bone eburnated bone and some varus and some posterior subluxation of the femur on the tibia and the medial compartment.  Initially 10 mm resected of the tibial came back take 2 more millimeters and also resected 12 off the femur which allowed a 5 mm spacer to fit.  Chamfer cuts box cuts keel preparation pulse lavage 38 mm patella 3 peg and drilling of the lug nuts on the trial after trial showed full extension.  Extensive time was removing posterior spurs on the posterior medial aspect and remnants of the meniscus which had been extruded on the medial side with mucinous degeneration and some fibrous tissue making the meniscus almost his heart is bone.  Once all piece of bone removed and VAC mixing the cement cementing of the tibia femur insertion of the 5 mm rotating  bearing DePuy and 38 mm patella Marcaine plus Exparel was infiltrated for postoperative analgesia tourniquet deflated once cement was hardened at 14 minutes.  Hemostasis obtained and then standard layered closure skin closure with staples capsule is closed with intermittent #1 Vicryl 0 Vicryl 2-0 Vicryl subtenons  tissue skin staple closure Aquacel dressing Webril Ace wrap ice machine in the knee immobilizer.

## 2023-02-12 NOTE — Anesthesia Procedure Notes (Signed)
Spinal  Patient location during procedure: OR Start time: 02/12/2023 5:00 PM End time: 02/12/2023 5:05 PM Reason for block: surgical anesthesia Staffing Performed: anesthesiologist  Anesthesiologist: [place] Nation, MD Performed by: [place] Nation, MD Authorized by: Sidman Nation, MD   Preanesthetic Checklist Completed: patient identified, IV checked, site marked, risks and benefits discussed, surgical consent, monitors and equipment checked, pre-op evaluation and timeout performed Spinal Block Patient position: sitting Prep: DuraPrep Patient monitoring: heart rate, cardiac monitor, continuous pulse ox and blood pressure Approach: midline Location: L4-5 Needle Needle type: Pencan  Needle gauge: 24 G Assessment Sensory level: T6 Additional Notes Functioning IV was confirmed and monitors were applied. Sterile prep and drape, including hand hygiene and sterile gloves were used. The patient was positioned and the spine was prepped. The skin was anesthetized with lidocaine.  Free flow of clear CSF was obtained prior to injecting local anesthetic into the CSF.  The spinal needle aspirated freely following injection.  The needle was carefully withdrawn.  The patient tolerated the procedure well.

## 2023-02-12 NOTE — Anesthesia Procedure Notes (Signed)
Anesthesia Regional Block: Adductor canal block   Pre-Anesthetic Checklist: , timeout performed,  Correct Patient, Correct Site, Correct Laterality,  Correct Procedure, Correct Position, site marked,  Risks and benefits discussed,  Surgical consent,  Pre-op evaluation,  At surgeon's request and post-op pain management  Laterality: Lower and Left  Prep: chloraprep       Needles:  Injection technique: Single-shot  Needle Type: Echogenic Stimulator Needle     Needle Length: 9cm  Needle Gauge: 21     Additional Needles:   Procedures:,,,, ultrasound used (permanent image in chart),,    Narrative:  Start time: 02/12/2023 3:30 PM End time: 02/12/2023 3:35 PM Injection made incrementally with aspirations every 5 mL.  Performed by: Personally  Anesthesiologist: Pulcifer Nation, MD  Additional Notes: Discussed risks and benefits of the nerve block in detail, including but not limited vascular injury, permanent nerve damage and infection.   Patient tolerated the procedure well. Local anesthetic introduced in an incremental fashion under minimal resistance after negative aspirations. No paresthesias were elicited. After completion of the procedure, no acute issues were identified and patient continued to be monitored by RN.

## 2023-02-12 NOTE — Interval H&P Note (Signed)
History and Physical Interval Note:  02/12/2023 4:37 PM  Eddie Santee Sr.  has presented today for surgery, with the diagnosis of left knee osteoarthritis.  The various methods of treatment have been discussed with the patient and family. After consideration of risks, benefits and other options for treatment, the patient has consented to  Procedure(s) with comments: LEFT TOTAL KNEE ARTHROPLASTY (Left) - Needs RNFA as a surgical intervention.  The patient's history has been reviewed, patient examined, no change in status, stable for surgery.  I have reviewed the patient's chart and labs.  Questions were answered to the patient's satisfaction.     [name]

## 2023-02-13 ENCOUNTER — Encounter (HOSPITAL_COMMUNITY): Payer: Self-pay | Admitting: Orthopaedic Surgery

## 2023-02-13 LAB — BASIC METABOLIC PANEL
Anion gap: 9 (ref 5–15)
BUN: 18 mg/dL (ref 8–23)
CO2: 23 mmol/L (ref 22–32)
Calcium: 8.7 mg/dL — ABNORMAL LOW (ref 8.9–10.3)
Chloride: 101 mmol/L (ref 98–111)
Creatinine, Ser: 1.33 mg/dL — ABNORMAL HIGH (ref 0.61–1.24)
GFR, Estimated: 56 mL/min — ABNORMAL LOW (ref >60)
Glucose, Bld: 163 mg/dL — ABNORMAL HIGH (ref 70–99)
Potassium: 4.8 mmol/L (ref 3.5–5.1)
Sodium: 133 mmol/L — ABNORMAL LOW (ref 135–145)

## 2023-02-13 LAB — CBC
HCT: 44.6 % (ref 39.0–52.0)
Hemoglobin: 14.2 g/dL (ref 13.0–17.0)
MCH: 29.6 pg (ref 26.0–34.0)
MCHC: 31.8 g/dL (ref 30.0–36.0)
MCV: 93.1 fL (ref 80.0–100.0)
Platelets: 225 10*3/uL (ref 150–400)
RBC: 4.79 MIL/uL (ref 4.22–5.81)
RDW: 13.2 % (ref 11.5–15.5)
WBC: 12.9 10*3/uL — ABNORMAL HIGH (ref 4.0–10.5)
nRBC: 0 % (ref 0.0–0.2)

## 2023-02-13 NOTE — Plan of Care (Signed)

## 2023-02-13 NOTE — Progress Notes (Addendum)
CPM machine replaced after giving patient approximately a three hour break. Pt unable to tolerate 50 degrees. CPM machine moved back down to 45 degrees flexion.

## 2023-02-13 NOTE — Evaluation (Signed)
Physical Therapy Evaluation Patient Details Name: Eddie OBERHELMAN Sr. MRN: [id number] DOB: 03-30-1951 Today's Date: 02/13/2023  History of Present Illness  Pt is 72 yo male who presents on 02/12/23 for L TKA. PMH: CAD, blind R eye, GERD, fibromyalgia, COPD, HTN, DM, R CVA  Clinical Impression  Pt admitted with above diagnosis. Pt confused on eval and crying out with all passive motion, not attempting any active motion. Pt needed max A to come to EOB. Attempted sit>stand multiple times with max A but pt unable to achieve standing. Pt had multiple pills in bed with him, RN notified. Patient will benefit from continued inpatient follow up therapy, <3 hours/day.  Pt currently with functional limitations due to the deficits listed below (see PT Problem List). Pt will benefit from acute skilled PT to increase their independence and safety with mobility to allow discharge.           If plan is discharge home, recommend the following: Two people to help with walking and/or transfers;A lot of help with bathing/dressing/bathroom;Assistance with cooking/housework;Direct supervision/assist for medications management;Direct supervision/assist for financial management;Assist for transportation;Help with stairs or ramp for entrance;Supervision due to cognitive status   Can travel by private vehicle   No    Equipment Recommendations None recommended by PT  Recommendations for Other Services  OT consult    Functional Status Assessment Patient has had a recent decline in their functional status and demonstrates the ability to make significant improvements in function in a reasonable and predictable amount of time.     Precautions / Restrictions Precautions Precautions: Knee Precaution Booklet Issued: Yes (comment) Precaution Comments: reviewed knee precautions and gave ther ex handout but pt not retaining info today, will need relearning Required Braces or Orthoses: Knee Immobilizer - Left Knee  Immobilizer - Left: On when out of bed or walking Restrictions Weight Bearing Restrictions: Yes LLE Weight Bearing: Weight bearing as tolerated      Mobility  Bed Mobility Overal bed mobility: Needs Assistance Bed Mobility: Supine to Sit, Sit to Supine     Supine to sit: Max assist Sit to supine: Max assist, +2 for physical assistance   General bed mobility comments: max A for all mvmt because pt resisting at times. 2 person assist to return to supine    Transfers Overall transfer level: Needs assistance Equipment used: Rolling walker (2 wheels) Transfers: Sit to/from Stand Sit to Stand: Max assist           General transfer comment: attempted sit>stand multiple times with max A, pt able to barely clear buttocks but not achieve full standing    Ambulation/Gait               General Gait Details: unable  Stairs            Wheelchair Mobility     Tilt Bed    Modified Rankin (Stroke Patients Only)       Balance Overall balance assessment: Needs assistance Sitting-balance support: Feet supported, No upper extremity supported Sitting balance-Leahy Scale: Fair     Standing balance support: Bilateral upper extremity supported, During functional activity Standing balance-Leahy Scale: Zero                               Pertinent Vitals/Pain Pain Assessment Pain Assessment: Faces Pain Score: 10-Worst pain ever Pain Location: L knee Pain Descriptors / Indicators: Aching, Discomfort, Grimacing, Guarding, Sore Pain Intervention(s): Limited activity within patient's  tolerance, Monitored during session, Premedicated before session    Home Living Family/patient expects to be discharged to:: Private residence Living Arrangements: Children Available Help at Discharge: Family;Available PRN/intermittently               Additional Comments: pt not a reliable historian today and having difficulty staying on topic, will need to get  accurate home info later. His son lives with him but sounds like he is not always there.    Prior Function Prior Level of Function : Independent/Modified Independent             Mobility Comments: ambulated with RW, reports that he limped ADLs Comments: independent but reports that he had a hard time showering     Extremity/Trunk Assessment   Upper Extremity Assessment Upper Extremity Assessment: Defer to OT evaluation    Lower Extremity Assessment Lower Extremity Assessment: RLE deficits/detail;LLE deficits/detail RLE Deficits / Details: h/o R TKA, lacking >10 deg extension. Strength >3/5 RLE Sensation: WNL RLE Coordination: WNL LLE Deficits / Details: cries out with all passive mvmt and will not attempt active mvmt. Allowed only 15 deg knee flex and extension to 25 deg LLE: Unable to fully assess due to pain LLE Sensation: WNL LLE Coordination: decreased gross motor    Cervical / Trunk Assessment Cervical / Trunk Assessment: Normal  Communication   Communication Communication: Hearing impairment Cueing Techniques: Verbal cues;Gestural cues;Tactile cues;Visual cues  Cognition Arousal: Alert Behavior During Therapy: Impulsive Overall Cognitive Status: Impaired/Different from baseline Area of Impairment: Orientation, Attention, Memory, Following commands, Safety/judgement, Awareness, Problem solving                 Orientation Level: Disoriented to, Situation, Time, Place Current Attention Level: Focused Memory: Decreased recall of precautions, Decreased short-term memory Following Commands: Follows one step commands consistently, Follows one step commands with increased time Safety/Judgement: Decreased awareness of safety, Decreased awareness of deficits Awareness: Intellectual Problem Solving: Decreased initiation, Difficulty sequencing, Requires verbal cues, Requires tactile cues, Slow processing General Comments: thinks he is at home, talking constantly, even  when PT left room.        General Comments General comments (skin integrity, edema, etc.): pt in soaked bed with multiple pills under him. RN notified    Exercises Total Joint Exercises Ankle Circles/Pumps: AAROM, Left, 10 reps, Supine, Seated Heel Slides: PROM, Left, 10 reps, Supine Hip ABduction/ADduction: PROM, Left, 10 reps, Supine Straight Leg Raises: PROM, Left, 10 reps, Supine Long Arc Quad: PROM, Left, 10 reps, Seated Goniometric ROM: 20-40   Assessment/Plan    PT Assessment Patient needs continued PT services  PT Problem List Decreased range of motion;Decreased strength;Decreased activity tolerance;Decreased balance;Decreased mobility;Decreased coordination;Decreased cognition;Decreased knowledge of use of DME;Decreased safety awareness;Decreased knowledge of precautions;Pain       PT Treatment Interventions DME instruction;Gait training;Functional mobility training;Therapeutic activities;Therapeutic exercise;Balance training;Neuromuscular re-education;Cognitive remediation;Patient/family education    PT Goals (Current goals can be found in the Care Plan section)  Acute Rehab PT Goals Patient Stated Goal: return home PT Goal Formulation: With patient Time For Goal Achievement: 02/27/23 Potential to Achieve Goals: Fair    Frequency Min 1X/week     Co-evaluation               AM-PAC PT "6 Clicks" Mobility  Outcome Measure Help needed turning from your back to your side while in a flat bed without using bedrails?: Total Help needed moving from lying on your back to sitting on the side of a flat bed without using  bedrails?: Total Help needed moving to and from a bed to a chair (including a wheelchair)?: Total Help needed standing up from a chair using your arms (e.g., wheelchair or bedside chair)?: Total Help needed to walk in hospital room?: Total Help needed climbing 3-5 steps with a railing? : Total 6 Click Score: 6    End of Session Equipment Utilized  During Treatment: Gait belt Activity Tolerance: Patient limited by pain Patient left: in bed;with call bell/phone within reach;with bed alarm set Nurse Communication: Mobility status PT Visit Diagnosis: Unsteadiness on feet (R26.81);Muscle weakness (generalized) (M62.81);Difficulty in walking, not elsewhere classified (R26.2);Pain Pain - Right/Left: Left Pain - part of body: Knee    Time: 1010-1055 PT Time Calculation (min) (ACUTE ONLY): 45 min   Charges:   PT Evaluation $PT Eval Moderate Complexity: 1 Mod PT Treatments $Therapeutic Exercise: 8-22 mins $Therapeutic Activity: 8-22 mins PT General Charges $$ ACUTE PT VISIT: 1 Visit         Lyanne Co, PT  Acute Rehab Services Secure chat preferred Office [phone]   Eddie Reed 02/13/2023, 12:10 PM

## 2023-02-13 NOTE — Progress Notes (Signed)
CPM increased to 50 degrees from 45 degrees.   Will increase additional 5 degrees throughout the day, as tolerated.

## 2023-02-13 NOTE — Evaluation (Signed)
Occupational Therapy Evaluation Patient Details Name: Eddie SCHROM Sr. MRN: [id number] DOB: Feb 26, 1951 Today's Date: 02/13/2023   History of Present Illness Pt is 72 yo male who presents on 02/12/23 for L TKA. PMH: CAD, blind R eye, GERD, fibromyalgia, COPD, HTN, DM, R CVA   Clinical Impression   Pt s/p above diagnosis. Pt c/o min-mod pain at rest, increases to 10/10 pain with movement. Pt poor historian, not able to give accurate/consistent history. Pt currently not able to assist to EOB due to severe pain with any slight movement to LLE, after multiple attempts to scoot to EOB. Pt required mod A and increased time to complete feeding, not able to get drink/straw to mouth without mod A, difficulty using spoon/fork for meal, nursing informed to assist. Pt would benefit greatly from postacute therapy <3hrs/day to improve functional independence and increase mobility, will be seen acutely to progress as able.       If plan is discharge home, recommend the following: Two people to help with walking and/or transfers;Two people to help with bathing/dressing/bathroom;Assistance with cooking/housework;Direct supervision/assist for medications management;Assistance with feeding;Assist for transportation;Help with stairs or ramp for entrance    Functional Status Assessment  Patient has had a recent decline in their functional status and demonstrates the ability to make significant improvements in function in a reasonable and predictable amount of time.  Equipment Recommendations  Other (comment) (defer)    Recommendations for Other Services       Precautions / Restrictions Precautions Precautions: Knee Precaution Booklet Issued: Yes (comment) Precaution Comments: per PT Required Braces or Orthoses: Knee Immobilizer - Left Knee Immobilizer - Left: On when out of bed or walking Restrictions Weight Bearing Restrictions: Yes LLE Weight Bearing: Weight bearing as tolerated      Mobility Bed  Mobility Overal bed mobility: Needs Assistance             General bed mobility comments: refused due to severe pain with assisting LLE x1 assist    Transfers Overall transfer level: Needs assistance                 General transfer comment: refused due to pain      Balance Overall balance assessment: Needs assistance                                         ADL either performed or assessed with clinical judgement   ADL Overall ADL's : Needs assistance/impaired Eating/Feeding: Moderate assistance;Bed level   Grooming: Moderate assistance;Bed level   Upper Body Bathing: Maximal assistance;Bed level   Lower Body Bathing: Total assistance;Bed level   Upper Body Dressing : Moderate assistance;Bed level   Lower Body Dressing: Total assistance;Bed level   Toilet Transfer: Total assistance;+2 for physical assistance;BSC/3in1   Toileting- Clothing Manipulation and Hygiene: Total assistance;Sitting/lateral lean         General ADL Comments: Pt mod-total for all ADLs, not currently able to feed himself, not able to get drink with straw to his mouth, not able to assist to EOB due to severe weakness/pain with minimal movement to LLE.     Vision Baseline Vision/History: 1 Wears glasses Ability to See in Adequate Light: 2 Moderately impaired Patient Visual Report: Other (comment) (blind in R eye)       Perception         Praxis         Pertinent  Vitals/Pain Pain Assessment Pain Assessment: Faces Faces Pain Scale: Hurts worst Pain Location: L knee Pain Descriptors / Indicators: Aching, Discomfort, Grimacing, Guarding, Sore Pain Intervention(s): Monitored during session     Extremity/Trunk Assessment Upper Extremity Assessment Upper Extremity Assessment: RUE deficits/detail;LUE deficits/detail RUE Deficits / Details: B hand numbness, shoulder stiffness, limited ability to perform feeding RUE: Shoulder pain with ROM RUE Sensation:  history of peripheral neuropathy RUE Coordination: decreased fine motor;decreased gross motor LUE Deficits / Details: B hand numbness, shoulder stiffness, limited ability to perform feeding LUE: Shoulder pain with ROM LUE Sensation: history of peripheral neuropathy LUE Coordination: decreased fine motor;decreased gross motor   Lower Extremity Assessment Lower Extremity Assessment: Defer to PT evaluation       Communication Communication Communication: Hearing impairment Cueing Techniques: Verbal cues   Cognition Arousal: Alert Behavior During Therapy: Impulsive Overall Cognitive Status: No family/caregiver present to determine baseline cognitive functioning Area of Impairment: Orientation, Attention, Memory, Following commands, Safety/judgement, Awareness, Problem solving                 Orientation Level: Disoriented to, Time, Situation Current Attention Level: Focused Memory: Decreased recall of precautions, Decreased short-term memory Following Commands: Follows one step commands consistently, Follows one step commands with increased time Safety/Judgement: Decreased awareness of safety, Decreased awareness of deficits Awareness: Intellectual Problem Solving: Decreased initiation, Difficulty sequencing, Requires verbal cues, Requires tactile cues, Slow processing General Comments: Pt able tostate he is at [place], does not know why, not oriented to time     General Comments       Exercises     Shoulder Instructions      Home Living Family/patient expects to be discharged to:: Private residence Living Arrangements: Alone Available Help at Discharge: Family;Available PRN/intermittently                             Additional Comments: Pt not reliable historian, unable to answer questions consistently      Prior Functioning/Environment Prior Level of Function : Independent/Modified Independent             Mobility Comments: ambulated with RW,  reports that he limped ADLs Comments: son helps with ADLs, IADLs        OT Problem List: Decreased strength;Decreased range of motion;Decreased activity tolerance;Impaired balance (sitting and/or standing);Impaired vision/perception;Decreased coordination;Decreased cognition;Decreased knowledge of use of DME or AE;Decreased safety awareness;Pain;Impaired UE functional use      OT Treatment/Interventions: Self-care/ADL training;Neuromuscular education;Therapeutic exercise;Energy conservation;DME and/or AE instruction;Manual therapy;Therapeutic activities;Patient/family education;Balance training    OT Goals(Current goals can be found in the care plan section) Acute Rehab OT Goals Patient Stated Goal: not able to participate in goal setting OT Goal Formulation: Patient unable to participate in goal setting Time For Goal Achievement: 02/27/23 Potential to Achieve Goals: Fair  OT Frequency: Min 1X/week    Co-evaluation              AM-PAC OT "6 Clicks" Daily Activity     Outcome Measure Help from another person eating meals?: A Lot Help from another person taking care of personal grooming?: A Lot Help from another person toileting, which includes using toliet, bedpan, or urinal?: Total Help from another person bathing (including washing, rinsing, drying)?: A Lot Help from another person to put on and taking off regular upper body clothing?: A Lot Help from another person to put on and taking off regular lower body clothing?: Total 6 Click Score: 10  End of Session CPM Left Knee CPM Left Knee: Off (removed by OT) Left Knee Flexion (Degrees): 45 Left Knee Extension (Degrees): 0  Activity Tolerance:   Patient left:    OT Visit Diagnosis: Unsteadiness on feet (R26.81);Other abnormalities of gait and mobility (R26.89);Muscle weakness (generalized) (M62.81);Pain;Other symptoms and signs involving cognitive function Pain - Right/Left: Left Pain - part of body: Knee                 Time: 1610-9604 OT Time Calculation (min): 33 min Charges:  OT General Charges $OT Visit: 1 Visit OT Evaluation $OT Eval High Complexity: 1 High OT Treatments $Self Care/Home Management : 8-22 mins  [place], OTR/L   Alexis Goodell 02/13/2023, 4:27 PM

## 2023-02-13 NOTE — Progress Notes (Signed)
Occupational therapy removed CPM machine d/t pt complaints and pain. OT stated pt refused to use again at this time and would like to keep CPM off. PT educated on refusal and importance.

## 2023-02-13 NOTE — Plan of Care (Signed)
Problem: Education: Goal: Knowledge of the prescribed therapeutic regimen will improve Outcome: Progressing Goal: Individualized Educational Video(s) Outcome: Progressing   Problem: Activity: Goal: Ability to avoid complications of mobility impairment will improve Outcome: Progressing Goal: Range of joint motion will improve Outcome: Progressing   Problem: Pain Management: Goal: Pain level will decrease with appropriate interventions Outcome: Progressing

## 2023-02-13 NOTE — Progress Notes (Signed)
CSW attempted to meet with pt for MOON and for SNF assess.  Pt states he is too tired to talk, "my brain can't work right now" asks CSW to come back later.  1535: CSW returned to room, pt asleep.  Daleen Squibb, MSW, LCSW 9/26/20244:08 PM

## 2023-02-13 NOTE — Progress Notes (Signed)
Orthopedic Tech Progress Note Patient Details:  Eddie VANRIPER Sr. 02-Feb-1951 [id number]  RN helped me slide up patient. He was in so much discomfort just letting the knees down in the bed. When we lifted his leg to put him on the CPM he was very sore. RN was there so I showed her how the CPM is used but patient could not handle anything 50 degrees so I left him at 45 degrees.. very stiffed this morning    CPM Left Knee CPM Left Knee: On Left Knee Flexion (Degrees): 0 Left Knee Extension (Degrees): 45  Post Interventions Patient Tolerated: Fair, Poor Instructions Provided: Care of device  [name] 02/13/2023, 6:38 AM

## 2023-02-13 NOTE — Progress Notes (Signed)
Patient has had periods of severe confusion. He keeps forgotten where he is at, and even pulled out his IV. Nursing staff heard patient yelling for his ex wife and thinks he is at home. Nursing staff has had to reorient patient continuously throughout the night.

## 2023-02-14 ENCOUNTER — Encounter (HOSPITAL_COMMUNITY): Payer: Self-pay | Admitting: Orthopaedic Surgery

## 2023-02-14 LAB — CBC
HCT: 43.1 % (ref 39.0–52.0)
Hemoglobin: 13.9 g/dL (ref 13.0–17.0)
MCH: 30.1 pg (ref 26.0–34.0)
MCHC: 32.3 g/dL (ref 30.0–36.0)
MCV: 93.3 fL (ref 80.0–100.0)
Platelets: 216 10*3/uL (ref 150–400)
RBC: 4.62 MIL/uL (ref 4.22–5.81)
RDW: 13.9 % (ref 11.5–15.5)
WBC: 20.4 10*3/uL — ABNORMAL HIGH (ref 4.0–10.5)
nRBC: 0 % (ref 0.0–0.2)

## 2023-02-14 NOTE — Progress Notes (Addendum)
Patient ID: Eddie Andrada., male   DOB: 10-13-1950, 72 y.o.   MRN: [id number]   Subjective: 2 Days Post-Op Procedure(s) (LRB): LEFT TOTAL KNEE ARTHROPLASTY (Left) Patient reports pain as moderate and severe.  Over sedated from pain meds.   Objective: Vital signs in last 24 hours: Temp:  [98 F (36.7 C)-99.8 F (37.7 C)] 98 F (36.7 C) (09/27 0900) Pulse Rate:  [105-115] 109 (09/27 0900) Resp:  [17-20] 20 (09/27 0900) BP: (101-117)/(61-72) 101/61 (09/27 0900) SpO2:  [87 %-91 %] 90 % (09/27 0900)  Intake/Output from previous day: 09/26 0701 - 09/27 0700 In: 240 [P.O.:240] Out: 300 [Urine:300] Intake/Output this shift: No intake/output data recorded.  Recent Labs    02/13/23 0445  HGB 14.2   Recent Labs    02/13/23 0445  WBC 12.9*  RBC 4.79  HCT 44.6  PLT 225   Recent Labs    02/13/23 0445  NA 133*  K 4.8  CL 101  CO2 23  BUN 18  CREATININE 1.33*  GLUCOSE 163*  CALCIUM 8.7*   Recent Labs    02/12/23 1155  INR 1.0    Neurologically intact No results found.  Assessment/Plan: 2 Days Post-Op Procedure(s) (LRB): LEFT TOTAL KNEE ARTHROPLASTY (Left) Up with therapy, not ready for home this AM. Will see if he does better this afternoon.  Stopped percocet 10-15 mg. Stopped oxycontin 10mg  po bid.   He still has percocet 5-10 mg po . For pain Eddie Reed 02/14/2023, 11:18 AM

## 2023-02-14 NOTE — Care Management Obs Status (Signed)
MEDICARE OBSERVATION STATUS NOTIFICATION   Patient Details  Name: Eddie CHOI Sr. MRN: [id number] Date of Birth: [birth year]   Medicare Observation Status Notification Given:  Yes  Pt oriented x3 but not very focused in conversation.  CSW spoke with pt daughter Betsey Holiday and provided information regarding MOON.  Lorri Frederick, LCSW 02/14/2023, 10:53 AM

## 2023-02-14 NOTE — Discharge Instructions (Signed)

## 2023-02-14 NOTE — Progress Notes (Signed)
Physical Therapy Treatment Patient Details Name: Eddie MAZZARELLA Sr. MRN: [id number] DOB: [birth year] Today's Date: 02/14/2023   History of Present Illness Pt is 72 yo male who presents on 02/12/23 for L TKA. PMH: CAD, blind R eye, GERD, fibromyalgia, COPD, HTN, DM, R CVA    PT Comments  Pt supine in bed with CPM on, when removed bed found to be saturated in urine as condom cath had fallen off. Pt with constant stream of complaints, but ultimately able to don KI for out of bed. Pt limited in safe mobility by decreased cognition/command follow, and pain with all movement of L LE. Pt requiring maxAx2 for coming to EOB and for standing and pivoting to recliner. KI removed so that pt could sit upright for eating breakfast and encouraging knee flexion. D/c plans remain appropriate at this time. PT will continue to follow acutely.     If plan is discharge home, recommend the following: Two people to help with walking and/or transfers;A lot of help with bathing/dressing/bathroom;Assistance with cooking/housework;Direct supervision/assist for medications management;Direct supervision/assist for financial management;Assist for transportation;Help with stairs or ramp for entrance;Supervision due to cognitive status   Can travel by private vehicle     No  Equipment Recommendations  None recommended by PT    Recommendations for Other Services OT consult     Precautions / Restrictions Precautions Precautions: Knee Precaution Booklet Issued: Yes (comment) Precaution Comments: reviewed knee precautions and gave ther ex handout but pt not retaining info today, will need relearning Required Braces or Orthoses: Knee Immobilizer - Left Knee Immobilizer - Left:  (For transfers or walking  (needs to work on flex/ext when up in recliner)) Restrictions Weight Bearing Restrictions: Yes LLE Weight Bearing: Weight bearing as tolerated     Mobility  Bed Mobility Overal bed mobility: Needs Assistance Bed  Mobility: Supine to Sit, Sit to Supine     Supine to sit: Max assist Sit to supine: Max assist, +2 for physical assistance   General bed mobility comments: max Ax2 for all movement, follows very few commands for sequencing    Transfers Overall transfer level: Needs assistance Equipment used: Rolling walker (2 wheels) Transfers: Sit to/from Stand Sit to Stand: Max assist, +2 physical assistance           General transfer comment: sit>stand from elevated surface multimodal cuing for hand placement including placing hands in correct position but as soon as he shifts forward he attempts to flex forward over the RW, ultimately needs maxAx2 for physically assisting to pivot to chair, pt able to take 1-2 pivotal steps before trying to swing his hips towards the chair to sit    Ambulation/Gait               General Gait Details: unable       Balance Overall balance assessment: Needs assistance Sitting-balance support: Feet supported, No upper extremity supported Sitting balance-Leahy Scale: Fair     Standing balance support: Bilateral upper extremity supported, During functional activity Standing balance-Leahy Scale: Zero                              Cognition Arousal: Alert Behavior During Therapy: Impulsive Overall Cognitive Status: Impaired/Different from baseline Area of Impairment: Orientation, Attention, Memory, Following commands, Safety/judgement, Awareness, Problem solving                 Orientation Level: Disoriented to, Situation, Time, Place Current Attention Level: Focused Memory:  Decreased recall of precautions, Decreased short-term memory Following Commands: Follows one step commands consistently, Follows one step commands with increased time Safety/Judgement: Decreased awareness of safety, Decreased awareness of deficits Awareness: Intellectual Problem Solving: Decreased initiation, Difficulty sequencing, Requires verbal cues,  Requires tactile cues, Slow processing General Comments: constantly talking/complaining and reporting he can't before he even tries, needs maximal cuing for all tasks and follows 1 out of 3 commands           General Comments General comments (skin integrity, edema, etc.): pt bed soaked in urine condom cath had fallen off, NT replaced before bed mobility, KI donned in supine and removed in seated so that pt could flex knee to sit in recliner for breakfast      Pertinent Vitals/Pain Pain Assessment Pain Assessment: Faces Pain Score: 10-Worst pain ever Pain Location: L knee Pain Descriptors / Indicators: Aching, Discomfort, Grimacing, Guarding, Sore Pain Intervention(s): Limited activity within patient's tolerance, Monitored during session, Repositioned    Home Living Family/patient expects to be discharged to:: Private residence Living Arrangements: Children Available Help at Discharge: Family;Available PRN/intermittently               Additional Comments: pt not a reliable historian today and having difficulty staying on topic, will need to get accurate home info later. His son lives with him but sounds like he is not always there.    Prior Function            PT Goals (current goals can now be found in the care plan section) Acute Rehab PT Goals Patient Stated Goal: return home PT Goal Formulation: With patient Time For Goal Achievement: 02/27/23 Potential to Achieve Goals: Fair Progress towards PT goals: Progressing toward goals    Frequency    Min 1X/week       AM-PAC PT "6 Clicks" Mobility   Outcome Measure  Help needed turning from your back to your side while in a flat bed without using bedrails?: Total Help needed moving from lying on your back to sitting on the side of a flat bed without using bedrails?: Total Help needed moving to and from a bed to a chair (including a wheelchair)?: Total Help needed standing up from a chair using your arms (e.g.,  wheelchair or bedside chair)?: Total Help needed to walk in hospital room?: Total Help needed climbing 3-5 steps with a railing? : Total 6 Click Score: 6    End of Session Equipment Utilized During Treatment: Gait belt Activity Tolerance: Patient limited by pain Patient left: in bed;with call bell/phone within reach;with bed alarm set Nurse Communication: Mobility status PT Visit Diagnosis: Unsteadiness on feet (R26.81);Muscle weakness (generalized) (M62.81);Difficulty in walking, not elsewhere classified (R26.2);Pain Pain - Right/Left: Left Pain - part of body: Knee     Time: 1000-1032 PT Time Calculation (min) (ACUTE ONLY): 32 min  Charges:    $Gait Training: 8-22 mins $Therapeutic Exercise: 8-22 mins PT General Charges $$ ACUTE PT VISIT: 1 Visit                     Kaylia Winborne B. Beverely Risen PT, DPT Acute Rehabilitation Services Please use secure chat or  Call Office [phone]    [facility] 02/14/2023, 10:52 AM

## 2023-02-14 NOTE — TOC Initial Note (Signed)
Transition of Care (TOC) - Initial/Assessment Note    Patient Details  Name: Eddie HELMKAMP Sr. MRN: [id number] Date of Birth: 1950/12/19  [facility]) CM/SW Contact:    Lorri Frederick, LCSW Phone Number: 02/14/2023, 11:49 AM  Clinical Narrative:         Pt listed as oriented x3, pretty scattered when CSW tried to engaged him in conversation.  Does report he is from home, his son lives with him, unclear if this is temporary or permanent.  Permission given to speak with son Christiopher (Skip) and daughter Jacki Cones.  Daughter is first contact, CSW spoke with her, discussed SNF recommendation.  She will discuss with her brother and call back.            Expected Discharge Plan: Skilled Nursing Facility Barriers to Discharge: Continued Medical Work up   Patient Goals and CMS Choice            Expected Discharge Plan and Services In-house Referral: Clinical Social Work     Living arrangements for the past 2 months: Single Family Home                                      Prior Living Arrangements/Services Living arrangements for the past 2 months: Single Family Home Lives with:: Adult Children (son staying with him-unclear if this is permanent) Patient language and need for interpreter reviewed:: Yes        Need for Family Participation in Patient Care: Yes (Comment) Care giver support system in place?: Yes (comment)   Criminal Activity/Legal Involvement Pertinent to Current Situation/Hospitalization: No - Comment as needed  Activities of Daily Living   ADL Screening (condition at time of admission) Does the patient have a NEW difficulty with bathing/dressing/toileting/self-feeding that is expected to last >3 days?: No Does the patient have a NEW difficulty with getting in/out of bed, walking, or climbing stairs that is expected to last >3 days?: No Does the patient have a NEW difficulty with communication that is expected to last >3 days?: No Is the  patient deaf or have difficulty hearing?: Yes Does the patient have difficulty seeing, even when wearing glasses/contacts?: No Does the patient have difficulty concentrating, remembering, or making decisions?: No  Permission Sought/Granted Permission sought to share information with : Family Supports Permission granted to share information with : Yes, Verbal Permission Granted  Share Information with NAME: son Waldo, daughter Jacki Cones           Emotional Assessment Appearance:: Appears stated age Attitude/Demeanor/Rapport:  (scattered) Affect (typically observed): Agitated Orientation: : Oriented to Self, Oriented to Place, Oriented to Situation      Admission diagnosis:  S/P total knee arthroplasty, left [W09.811] Patient Active Problem List   Diagnosis Date Noted   S/P total knee arthroplasty, left 02/12/2023   Unilateral primary osteoarthritis, left knee 10/25/2022   Benign localized prostatic hyperplasia with lower urinary tract symptoms (LUTS) 09/11/2020   Urinary retention 09/11/2020   Stroke due to embolism of cerebral artery (HCC) 03/08/2015   Diabetic polyneuropathy 10/06/2013   Drug intolerance    Ringing in ear    Claudication (HCC)    Carotid artery disease (HCC)    Dyslipidemia    Hypertension    CAD (coronary artery disease)    Allergy history, drug    Diabetes mellitus    Fibromyalgia    Arm pain    Ejection  fraction    Tobacco abuse    S/P CABG x 1 09/18/2007   PCP:  Practice, [facility]:   [facility] - [place], [place] - [address] [place] [address] Phone: [phone] Fax: [phone]     Social Determinants of Health (SDOH) Social History: SDOH Screenings   Food Insecurity: No Food Insecurity (02/12/2023)  Housing: Low Risk  (02/12/2023)  Transportation Needs: No Transportation Needs (02/12/2023)  Utilities: Not At Risk (02/12/2023)  Tobacco Use: High Risk (02/12/2023)  Health Literacy: Medium Risk  (01/09/2021)   Received from [facility], [facility]   SDOH Interventions:     Readmission Risk Interventions     No data to display

## 2023-02-14 NOTE — Plan of Care (Signed)
  Problem: Education: Goal: Individualized Educational Video(s) Outcome: Progressing   Problem: Activity: Goal: Ability to avoid complications of mobility impairment will improve Outcome: Progressing Goal: Range of joint motion will improve Outcome: Progressing   Problem: Clinical Measurements: Goal: Postoperative complications will be avoided or minimized Outcome: Progressing   Problem: Pain Management: Goal: Pain level will decrease with appropriate interventions Outcome: Progressing   Problem: Education: Goal: Knowledge of General Education information will improve Description: Including pain rating scale, medication(s)/side effects and non-pharmacologic comfort measures Outcome: Progressing   Problem: Health Behavior/Discharge Planning: Goal: Ability to manage health-related needs will improve Outcome: Progressing   Problem: Clinical Measurements: Goal: Ability to maintain clinical measurements within normal limits will improve Outcome: Progressing Goal: Will remain free from infection Outcome: Progressing Goal: Diagnostic test results will improve Outcome: Progressing Goal: Respiratory complications will improve Outcome: Progressing Goal: Cardiovascular complication will be avoided Outcome: Progressing   Problem: Activity: Goal: Risk for activity intolerance will decrease Outcome: Progressing   Problem: Nutrition: Goal: Adequate nutrition will be maintained Outcome: Progressing   Problem: Elimination: Goal: Will not experience complications related to bowel motility Outcome: Progressing Goal: Will not experience complications related to urinary retention Outcome: Progressing   Problem: Pain Managment: Goal: General experience of comfort will improve Outcome: Progressing   Problem: Safety: Goal: Ability to remain free from injury will improve Outcome: Progressing   Problem: Skin Integrity: Goal: Risk for impaired skin integrity will decrease Outcome:  Progressing

## 2023-02-15 MED ORDER — TRAMADOL HCL 50 MG PO TABS
50.0000 mg | ORAL_TABLET | Freq: Two times a day (BID) | ORAL | Status: DC | PRN
  Administered 2023-02-15 – 2023-02-20 (×8): 50 mg via ORAL
  Filled 2023-02-15 (×9): qty 1

## 2023-02-15 MED ORDER — GABAPENTIN 800 MG PO TABS: 800.0000 mg | ORAL_TABLET | Freq: Two times a day (BID) | ORAL | Status: DC

## 2023-02-15 MED ORDER — GABAPENTIN 400 MG PO CAPS
800.0000 mg | ORAL_CAPSULE | Freq: Two times a day (BID) | ORAL | Status: DC
  Administered 2023-02-15 – 2023-02-20 (×10): 800 mg via ORAL
  Filled 2023-02-15 (×10): qty 2

## 2023-02-15 NOTE — Progress Notes (Signed)
Patient ID: Eddie Northup., male   DOB: Oct 20, 1950, 72 y.o.   MRN: [id number]   Subjective: 3 Days Post-Op Procedure(s) (LRB): LEFT TOTAL KNEE ARTHROPLASTY (Left) Patient reports pain as none in bed. Severe with moment.  Sleeping from too much pain meds.    Objective: Vital signs in last 24 hours: Temp:  [98 F (36.7 C)-98.7 F (37.1 C)] 98.7 F (37.1 C) (09/28 0457) Pulse Rate:  [100-109] 100 (09/28 0457) Resp:  [14-20] 15 (09/28 0457) BP: (101-124)/(59-62) 117/62 (09/28 0457) SpO2:  [89 %-91 %] 91 % (09/28 0457)  Intake/Output from previous day: 09/27 0701 - 09/28 0700 In: 240 [P.O.:240] Out: 750 [Urine:750] Intake/Output this shift: No intake/output data recorded.  Recent Labs    02/13/23 0445 02/14/23 1053  HGB 14.2 13.9   Recent Labs    02/13/23 0445 02/14/23 1053  WBC 12.9* 20.4*  RBC 4.79 4.62  HCT 44.6 43.1  PLT 225 216   Recent Labs    02/13/23 0445  NA 133*  K 4.8  CL 101  CO2 23  BUN 18  CREATININE 1.33*  GLUCOSE 163*  CALCIUM 8.7*   Recent Labs    02/12/23 1155  INR 1.0    Neurologically intact No results found.  Assessment/Plan: 3 Days Post-Op Procedure(s) (LRB): LEFT TOTAL KNEE ARTHROPLASTY (Left) Up with therapy. Stopped all narcotics . Will place tramadol one po q 12 hrs severe pain. Continue therapy. Not able to be discharge today , possibly tomorrow if he is more alert and balance better.   [name] 02/15/2023, 8:40 AM

## 2023-02-15 NOTE — Progress Notes (Signed)
Orthopedic Tech Progress Note Patient Details:  Eddie OPDAHL Sr. November 11, 1950 [id number]  I arrived to ask the pt if he would like to go into the CPM for a little bit this afternoon. Pt refused at this time.   CPM Left Knee CPM Left Knee: Other (Comment) (Refused) Left Knee Flexion (Degrees): 45 Left Knee Extension (Degrees): 0 Additional Comments: removed for therapy  Post Interventions Patient Tolerated: Refused intervention Instructions Provided: Care of device  Sharaine Delange Eddie Reed 02/15/2023, 5:17 PM

## 2023-02-16 NOTE — Progress Notes (Signed)
Pt. Refused application of CPM due c/o pain. Pt was pre-medicated and hour before attempt to apply. This nurse applies knee immobilizer and ice pack.

## 2023-02-16 NOTE — Progress Notes (Signed)
Patient ID: Eddie Reed., male   DOB: [birth year], 72 y.o.   MRN: [id number]   Subjective: 4 Days Post-Op Procedure(s) (LRB): LEFT TOTAL KNEE ARTHROPLASTY (Left) Continues to complain of pain with motion of the left knee.  Remains somewhat sedated this morning.  Objective: Vital signs in last 24 hours: Temp:  [98 F (36.7 C)-99.4 F (37.4 C)] 99.4 F (37.4 C) (09/29 0737) Pulse Rate:  [104-110] 105 (09/29 0737) Resp:  [18] 18 (09/29 0737) BP: (92-137)/(55-71) 117/64 (09/29 0737) SpO2:  [89 %-98 %] 89 % (09/29 0737)  Intake/Output from previous day: 09/28 0701 - 09/29 0700 In: -  Out: 900 [Urine:900] Intake/Output this shift: No intake/output data recorded.  Recent Labs    02/14/23 1053  HGB 13.9   Recent Labs    02/14/23 1053  WBC 20.4*  RBC 4.62  HCT 43.1  PLT 216   No results for input(s): "NA", "K", "CL", "CO2", "BUN", "CREATININE", "GLUCOSE", "CALCIUM" in the last 72 hours.  No results for input(s): "LABPT", "INR" in the last 72 hours.   Neurologically intact No results found.  Assessment/Plan: 4 Days Post-Op Procedure(s) (LRB): LEFT TOTAL KNEE ARTHROPLASTY (Left) Up with therapy.  Continue therapy.  Remains somewhat sedated and slow progression with therapy, discharge possibly tomorrow.  Eddie Reed 02/16/2023, 11:01 AM

## 2023-02-17 NOTE — Progress Notes (Signed)
Physical Therapy Treatment Patient Details Name: Eddie BELTRAM Sr. MRN: [id number] DOB: [birth year] Today's Date: 02/17/2023   History of Present Illness Pt is 72 yo male who presents on 02/12/23 for L TKA. PMH: CAD, blind R eye, GERD, fibromyalgia, COPD, HTN, DM, R CVA    PT Comments  Pt and RN in room on entry discussing that pt's car was never at the hospital and that the car has not been stolen. Pt is limited in his safe mobility by decreased cognition in particular poor safety awareness and command follow and surgical site pain increased in weightbearing in presence of generalized weakness and poor balance. Pt is currently maxAx2 for bed mobility, transfers and 3 feet ambulation with RW. Although pt would likely fair better cognitively in his home environment, he will benefit from continued inpatient follow up therapy, <3 hours/day.    If plan is discharge home, recommend the following: Two people to help with walking and/or transfers;A lot of help with bathing/dressing/bathroom;Assistance with cooking/housework;Direct supervision/assist for medications management;Direct supervision/assist for financial management;Assist for transportation;Help with stairs or ramp for entrance;Supervision due to cognitive status   Can travel by private vehicle     No  Equipment Recommendations  None recommended by PT    Recommendations for Other Services OT consult     Precautions / Restrictions Precautions Precautions: Knee Precaution Booklet Issued: Yes (comment) Precaution Comments: reviewed knee precautions and gave ther ex handout but pt not retaining info today, will need relearning Required Braces or Orthoses: Knee Immobilizer - Left Knee Immobilizer - Left:  (For transfers or walking  (needs to work on flex/ext when up in recliner)) Restrictions Weight Bearing Restrictions: Yes LLE Weight Bearing: Weight bearing as tolerated     Mobility  Bed Mobility Overal bed mobility: Needs  Assistance Bed Mobility: Supine to Sit, Sit to Supine       Sit to supine: Max assist   General bed mobility comments: maxA for returning LE back to bed    Transfers Overall transfer level: Needs assistance Equipment used: Rolling walker (2 wheels) Transfers: Sit to/from Stand, Bed to chair/wheelchair/BSC Sit to Stand: Max assist, +2 physical assistance           General transfer comment: pt sitting up in recliner on entry, LE elevated to don KI. Requires maximal multimodal cuing for hand placement for power up into standing and maxAx2 for bringing trunk to upright, maxAx2 for steadying as pt leans over RW to place dressing over sacral wound. unable to walk to recliner so after seated rest break recliner placed immediately adjacent to hospital bed, pt again requiring maximal multimodal cues and maxAx2 for standing from recliner and stepping to bed on his L    Ambulation/Gait Ambulation/Gait assistance: Max assist, +2 physical assistance Gait Distance (Feet): 3 Feet Assistive device: Rolling walker (2 wheels)   Gait velocity: slowed Gait velocity interpretation: <1.31 ft/sec, indicative of household ambulator   General Gait Details: maximal multimodal cues for sequencing steps forward, then begins to slump over RW placing forearms on hand grips, educated on improper use of RW and requested pt to sit in recliner directly behind him if unable to stand erect, pt sits immediately       Balance Overall balance assessment: Needs assistance Sitting-balance support: Feet supported, No upper extremity supported Sitting balance-Leahy Scale: Fair     Standing balance support: Bilateral upper extremity supported, During functional activity Standing balance-Leahy Scale: Zero  Cognition Arousal: Alert Behavior During Therapy: Impulsive Overall Cognitive Status: Impaired/Different from baseline Area of Impairment: Orientation, Attention,  Memory, Following commands, Safety/judgement, Awareness, Problem solving                 Orientation Level: Disoriented to, Situation, Time, Place Current Attention Level: Focused Memory: Decreased recall of precautions, Decreased short-term memory Following Commands: Follows one step commands consistently, Follows one step commands with increased time Safety/Judgement: Decreased awareness of safety, Decreased awareness of deficits Awareness: Intellectual Problem Solving: Decreased initiation, Difficulty sequencing, Requires verbal cues, Requires tactile cues, Slow processing General Comments: continues to have poor command follow, is very tangential with his conversation and has very poor awareness of his deficits reporting he is sure he can walk up the stairs           General Comments General comments (skin integrity, edema, etc.): pt with very poor awareness of deficits and safety, with standing pt noted to have unblanchable redness on his bottom including a fluid filled blister, RN present and dresses wound, PT asked RN to order air mattress to decrease risk for further pressure injuries      Pertinent Vitals/Pain Pain Assessment Pain Assessment: Faces Faces Pain Scale: Hurts whole lot Pain Location: L knee Pain Descriptors / Indicators: Aching, Discomfort, Grimacing, Guarding, Sore Pain Intervention(s): Limited activity within patient's tolerance, Monitored during session, Repositioned     PT Goals (current goals can now be found in the care plan section) Acute Rehab PT Goals Patient Stated Goal: return home PT Goal Formulation: With patient Time For Goal Achievement: 02/27/23 Potential to Achieve Goals: Fair Progress towards PT goals: Progressing toward goals    Frequency    Min 1X/week       AM-PAC PT "6 Clicks" Mobility   Outcome Measure  Help needed turning from your back to your side while in a flat bed without using bedrails?: Total Help needed  moving from lying on your back to sitting on the side of a flat bed without using bedrails?: Total Help needed moving to and from a bed to a chair (including a wheelchair)?: Total Help needed standing up from a chair using your arms (e.g., wheelchair or bedside chair)?: Total Help needed to walk in hospital room?: Total Help needed climbing 3-5 steps with a railing? : Total 6 Click Score: 6    End of Session Equipment Utilized During Treatment: Gait belt Activity Tolerance: Patient limited by pain Patient left: in bed;with call bell/phone within reach;with bed alarm set Nurse Communication: Mobility status PT Visit Diagnosis: Unsteadiness on feet (R26.81);Muscle weakness (generalized) (M62.81);Difficulty in walking, not elsewhere classified (R26.2);Pain Pain - Right/Left: Left Pain - part of body: Knee     Time: 0981-1914 PT Time Calculation (min) (ACUTE ONLY): 26 min  Charges:    $Therapeutic Activity: 23-37 mins PT General Charges $$ ACUTE PT VISIT: 1 Visit                     Eddie Reed PT, DPT Acute Rehabilitation Services Please use secure chat or  Call Office [phone]    Eddie Reed 02/17/2023, 1:32 PM

## 2023-02-17 NOTE — Progress Notes (Signed)
Patient ID: Eddie Strycharz., male   DOB: December 14, 1950, 72 y.o.   MRN: [id number] 2 plus assist, only 4 steps , sacral redness Alcus Dad per RN.  Will need SNF. I called his son and discussed and he is in agreement.

## 2023-02-17 NOTE — Consult Note (Signed)
WOC consulted 9/28 for red/hard buttocks, however today when WOC nursing back on the [facility] campus staff are not aware of any issues and orthopedic MDs have not mentioned this in their notes. Pending DC today. Will DC consult at this time  Re consult if needed, will not follow at this time. Thanks  Paislea Hatton [employer], RN,CWOCN, CNS, CWON-AP [phone])

## 2023-02-17 NOTE — Progress Notes (Addendum)
Patient ID: Eddie Reed., male   DOB: 06-07-1950, 72 y.o.   MRN: [id number]   Subjective: 5 Days Post-Op Procedure(s) (LRB): LEFT TOTAL KNEE ARTHROPLASTY (Left) Patient reports pain as mild.    Objective: Vital signs in last 24 hours: Temp:  [97.9 F (36.6 C)] 97.9 F (36.6 C) (09/30 0807) Pulse Rate:  [92-96] 96 (09/30 0355) Resp:  [18-20] 19 (09/30 0807) BP: (109-130)/(68-75) 109/68 (09/30 0807) SpO2:  [87 %-97 %] 97 % (09/30 0807)  Intake/Output from previous day: 09/29 0701 - 09/30 0700 In: 477 [P.O.:477] Out: -  Intake/Output this shift: No intake/output data recorded.  Recent Labs    02/14/23 1053  HGB 13.9   Recent Labs    02/14/23 1053  WBC 20.4*  RBC 4.62  HCT 43.1  PLT 216   No results for input(s): "NA", "K", "CL", "CO2", "BUN", "CREATININE", "GLUCOSE", "CALCIUM" in the last 72 hours. No results for input(s): "LABPT", "INR" in the last 72 hours.  Neurologically intact No results found.  Assessment/Plan: 5 Days Post-Op Procedure(s) (LRB): LEFT TOTAL KNEE ARTHROPLASTY (Left) Up with therapy. Did not get therapy yesterday.  He is more alert . Has son at home and if he can ambulate to BR safely should be able to be discharged. Son 6'2 220 lbs and states he would like pt back home.   Eddie Reed 02/17/2023, 8:36 AM

## 2023-02-17 NOTE — Plan of Care (Signed)
  Problem: Clinical Measurements: Goal: Will remain free from infection Outcome: Progressing   Problem: Pain Managment: Goal: General experience of comfort will improve Outcome: Progressing   Problem: Clinical Measurements: Goal: Respiratory complications will improve Outcome: Not Progressing   Problem: Activity: Goal: Risk for activity intolerance will decrease Outcome: Not Progressing

## 2023-02-17 NOTE — NC FL2 (Signed)
[place] MEDICAID FL2 LEVEL OF CARE FORM     IDENTIFICATION  Patient Name: Eddie BOUNDS Sr. Birthdate: 1951-01-05 Sex: male Admission Date (Current Location): 02/12/2023  [place] and [place] Number:  [address]nes Bast [phone] N Facility and Address:  [facility] [place], [address], [place], [place] [address]      Provider Number: [id number]  Attending Physician Name and Address:  [name], MD  Relative Name and Phone Number:  Betsey Holiday Daughter [phone]    Current Level of Care: Hospital Recommended Level of Care: Skilled Nursing Facility Prior Approval Number:    Date Approved/Denied:   PASRR Number: [id number]  Discharge Plan: SNF    Current Diagnoses: Patient Active Problem List   Diagnosis Date Noted   S/P total knee arthroplasty, left 02/12/2023   Unilateral primary osteoarthritis, left knee 10/25/2022   Benign localized prostatic hyperplasia with lower urinary tract symptoms (LUTS) 09/11/2020   Urinary retention 09/11/2020   Stroke due to embolism of cerebral artery (HCC) 03/08/2015   Diabetic polyneuropathy 10/06/2013   Drug intolerance    Ringing in ear    Claudication [facility])    Carotid artery disease (HCC)    Dyslipidemia    Hypertension    CAD (coronary artery disease)    Allergy history, drug    Diabetes mellitus    Fibromyalgia    Arm pain    Ejection fraction    Tobacco abuse    S/P CABG x 1 09/18/2007    Orientation RESPIRATION BLADDER Height & Weight     Self, Time, Situation, Place  O2 Incontinent, External catheter Weight: 179 lb 6.4 oz (81.4 kg) Height:  5\' 10"  (177.8 cm)  BEHAVIORAL SYMPTOMS/MOOD NEUROLOGICAL BOWEL NUTRITION STATUS      Continent Diet (see discharge summary)  AMBULATORY STATUS COMMUNICATION OF NEEDS Skin   Extensive Assist Verbally Surgical wounds                       Personal Care Assistance Level of Assistance  Bathing, Feeding, Dressing, Total care Bathing Assistance:  Maximum assistance Feeding assistance: Limited assistance Dressing Assistance: Maximum assistance Total Care Assistance: Maximum assistance   Functional Limitations Info  Sight, Hearing, Speech Sight Info: Adequate Hearing Info: Adequate Speech Info: Adequate    SPECIAL CARE FACTORS FREQUENCY  PT (By licensed PT), OT (By licensed OT)     PT Frequency: 5x week OT Frequency: 5x week            Contractures Contractures Info: Not present    Additional Factors Info  Code Status, Allergies Code Status Info: full Allergies Info: Plavix (Clopidogrel), Levaquin (Levofloxacin), Cymbalta (Duloxetine Hcl), Paxil (Paroxetine), Terbinafine Hcl           Current Medications (02/17/2023):  This is the current hospital active medication list Current Facility-Administered Medications  Medication Dose Route Frequency Provider Last Rate Last Admin   0.9 %  sodium chloride infusion   Intravenous Continuous [name], MD   Stopped at 02/13/23 0043   acetaminophen (TYLENOL) tablet 325-650 mg  325-650 mg Oral Q6H PRN [name], MD   650 mg at 02/14/23 0454   albuterol (PROVENTIL) (2.5 MG/3ML) 0.083% nebulizer solution 2.5 mg  2.5 mg Inhalation QID PRN [name], MD   2.5 mg at 02/14/23 0457   ALPRAZolam Prudy Feeler) tablet 1 mg  1 mg Oral TID PRN [name], MD   1 mg at 02/16/23 2229   amLODipine (NORVASC) tablet 5  mg  5 mg Oral Daily [name], MD   5 mg at 02/17/23 1914   aspirin EC tablet 325 mg  325 mg Oral Q breakfast [name], MD   325 mg at 02/17/23 7829   atorvastatin (LIPITOR) tablet 20 mg  20 mg Oral QHS [name], MD   20 mg at 02/16/23 2219   bisacodyl (DULCOLAX) suppository 10 mg  10 mg Rectal Daily PRN [name], MD       cholecalciferol (VITAMIN D3) 25 MCG (1000 UNIT) tablet 2,000 Units  2,000 Units Oral Daily [name], MD   2,000 Units at 02/17/23 5621   clobetasol cream (TEMOVATE) 0.05 % 1 Application  1 Application Topical BID PRN [name],  MD       diphenhydrAMINE (BENADRYL) 12.5 MG/5ML elixir 12.5-25 mg  12.5-25 mg Oral Q4H PRN [name], MD       docusate sodium (COLACE) capsule 100 mg  100 mg Oral BID [name], MD   100 mg at 02/17/23 3086   ferrous sulfate tablet 325 mg  325 mg Oral TID Barron Alvine, MD   325 mg at 02/17/23 1219   fluticasone (FLONASE) 50 MCG/ACT nasal spray 2 spray  2 spray Each Nare Daily PRN [name], MD       gabapentin (NEURONTIN) capsule 800 mg  800 mg Oral BID [name], MD   800 mg at 02/17/23 5784   ketoconazole (NIZORAL) 2 % cream 1 Application  1 Application Topical BID PRN [name], MD       menthol-cetylpyridinium (CEPACOL) lozenge 3 mg  1 lozenge Oral PRN [name], MD       Or   phenol (CHLORASEPTIC) mouth spray 1 spray  1 spray Mouth/Throat PRN [name], MD       metFORMIN (GLUCOPHAGE) tablet 1,000 mg  1,000 mg Oral BID WC [name], MD   1,000 mg at 02/17/23 6962   metoCLOPramide (REGLAN) tablet 5-10 mg  5-10 mg Oral Q8H PRN [name], MD       Or   metoCLOPramide (REGLAN) injection 5-10 mg  5-10 mg Intravenous Q8H PRN [name], MD       metoprolol succinate (TOPROL-XL) 24 hr tablet 12.5 mg  12.5 mg Oral Daily PRN [name], MD       multivitamin with minerals tablet 1 tablet  1 tablet Oral Daily [name], MD   1 tablet at 02/17/23 9528   nitroGLYCERIN (NITROSTAT) SL tablet 0.4 mg  0.4 mg Sublingual Q5 min PRN [name], MD       ondansetron [facility]) tablet 4 mg  4 mg Oral Q6H PRN [name], MD       Or   ondansetron [facility]) injection 4 mg  4 mg Intravenous Q6H PRN [name], MD       pantoprazole (PROTONIX) EC tablet 80 mg  80 mg Oral Q1200 [name], MD   80 mg at 02/17/23 1219   spironolactone (ALDACTONE) tablet 25 mg  25 mg Oral Daily [name], MD   25 mg at 02/17/23 4132   tamsulosin (FLOMAX) capsule 0.4 mg  0.4 mg Oral Daily [name], MD   0.4 mg at 02/17/23 4401   traMADol (ULTRAM) tablet 50 mg  50 mg  Oral Q12H PRN [name], MD   50 mg  at 02/17/23 0434   triamcinolone cream (KENALOG) 0.5 %   Topical BID [name], MD   Given at 02/17/23 [phone]     Discharge Medications: Please see discharge summary for a list of discharge medications.  Relevant Imaging Results:  Relevant Lab Results:   Additional Information SSN: [id number]  Lorri Frederick, LCSW

## 2023-02-17 NOTE — TOC Progression Note (Signed)
Transition of Care (TOC) - Progression Note    Patient Details  Name: EBRAHIM DEREMER Sr. MRN: [id number] Date of Birth: 1951-04-04  Transition of Care [facility]) CM/SW Contact  Lorri Frederick, LCSW Phone Number: 02/17/2023, 3:01 PM  Clinical Narrative:   CSW received message from pt son Skip, [phone].  Several attempts made to call him back.    Message from Dr Ophelia Charter, he has spoken with son, son is in agreement with plan for SNF.   CSW was able to reach daughter Jacki Cones, discussed the above, she is in agreement with plan for SNF as well.  Referral sent out in hub for SNF.    Expected Discharge Plan: Skilled Nursing Facility Barriers to Discharge: Continued Medical Work up  Expected Discharge Plan and Services In-house Referral: Clinical Social Work     Living arrangements for the past 2 months: Single Family Home                                       Social Determinants of Health (SDOH) Interventions SDOH Screenings   Food Insecurity: No Food Insecurity (02/12/2023)  Housing: Low Risk  (02/12/2023)  Transportation Needs: No Transportation Needs (02/12/2023)  Utilities: Not At Risk (02/12/2023)  Tobacco Use: High Risk (02/12/2023)  Health Literacy: Medium Risk (01/09/2021)   [facility]    Readmission Risk Interventions     No data to display

## 2023-02-18 DIAGNOSIS — I5022 Chronic systolic (congestive) heart failure: Secondary | ICD-10-CM | POA: Diagnosis present

## 2023-02-18 DIAGNOSIS — H409 Unspecified glaucoma: Secondary | ICD-10-CM | POA: Diagnosis present

## 2023-02-18 DIAGNOSIS — L8996 Pressure-induced deep tissue damage of unspecified site: Secondary | ICD-10-CM | POA: Diagnosis not present

## 2023-02-18 DIAGNOSIS — N400 Enlarged prostate without lower urinary tract symptoms: Secondary | ICD-10-CM | POA: Diagnosis present

## 2023-02-18 DIAGNOSIS — M1712 Unilateral primary osteoarthritis, left knee: Secondary | ICD-10-CM | POA: Diagnosis present

## 2023-02-18 DIAGNOSIS — Z96652 Presence of left artificial knee joint: Secondary | ICD-10-CM | POA: Diagnosis present

## 2023-02-18 DIAGNOSIS — E785 Hyperlipidemia, unspecified: Secondary | ICD-10-CM | POA: Diagnosis present

## 2023-02-18 DIAGNOSIS — M797 Fibromyalgia: Secondary | ICD-10-CM | POA: Diagnosis present

## 2023-02-18 DIAGNOSIS — J449 Chronic obstructive pulmonary disease, unspecified: Secondary | ICD-10-CM | POA: Diagnosis present

## 2023-02-18 DIAGNOSIS — Z79899 Other long term (current) drug therapy: Secondary | ICD-10-CM | POA: Diagnosis not present

## 2023-02-18 DIAGNOSIS — Z888 Allergy status to other drugs, medicaments and biological substances status: Secondary | ICD-10-CM | POA: Diagnosis not present

## 2023-02-18 DIAGNOSIS — I252 Old myocardial infarction: Secondary | ICD-10-CM | POA: Diagnosis not present

## 2023-02-18 DIAGNOSIS — I110 Hypertensive heart disease with heart failure: Secondary | ICD-10-CM | POA: Diagnosis present

## 2023-02-18 DIAGNOSIS — L89326 Pressure-induced deep tissue damage of left buttock: Secondary | ICD-10-CM | POA: Diagnosis not present

## 2023-02-18 DIAGNOSIS — E1151 Type 2 diabetes mellitus with diabetic peripheral angiopathy without gangrene: Secondary | ICD-10-CM | POA: Diagnosis present

## 2023-02-18 DIAGNOSIS — E1142 Type 2 diabetes mellitus with diabetic polyneuropathy: Secondary | ICD-10-CM | POA: Diagnosis present

## 2023-02-18 DIAGNOSIS — I2510 Atherosclerotic heart disease of native coronary artery without angina pectoris: Secondary | ICD-10-CM | POA: Diagnosis present

## 2023-02-18 DIAGNOSIS — K219 Gastro-esophageal reflux disease without esophagitis: Secondary | ICD-10-CM | POA: Diagnosis present

## 2023-02-18 DIAGNOSIS — L4050 Arthropathic psoriasis, unspecified: Secondary | ICD-10-CM | POA: Diagnosis present

## 2023-02-18 DIAGNOSIS — L89316 Pressure-induced deep tissue damage of right buttock: Secondary | ICD-10-CM | POA: Diagnosis not present

## 2023-02-18 LAB — CBC
HCT: 39.1 % (ref 39.0–52.0)
Hemoglobin: 12.9 g/dL — ABNORMAL LOW (ref 13.0–17.0)
MCH: 29.9 pg (ref 26.0–34.0)
MCHC: 33 g/dL (ref 30.0–36.0)
MCV: 90.7 fL (ref 80.0–100.0)
Platelets: 335 10*3/uL (ref 150–400)
RBC: 4.31 MIL/uL (ref 4.22–5.81)
RDW: 14.1 % (ref 11.5–15.5)
WBC: 14.7 10*3/uL — ABNORMAL HIGH (ref 4.0–10.5)
nRBC: 0 % (ref 0.0–0.2)

## 2023-02-18 LAB — BASIC METABOLIC PANEL
Anion gap: 10 (ref 5–15)
BUN: 48 mg/dL — ABNORMAL HIGH (ref 8–23)
CO2: 23 mmol/L (ref 22–32)
Calcium: 8.6 mg/dL — ABNORMAL LOW (ref 8.9–10.3)
Chloride: 100 mmol/L (ref 98–111)
Creatinine, Ser: 1.25 mg/dL — ABNORMAL HIGH (ref 0.61–1.24)
GFR, Estimated: >60 mL/min (ref >60)
Glucose, Bld: 228 mg/dL — ABNORMAL HIGH (ref 70–99)
Potassium: 4.8 mmol/L (ref 3.5–5.1)
Sodium: 133 mmol/L — ABNORMAL LOW (ref 135–145)

## 2023-02-18 NOTE — Plan of Care (Signed)
Patient ID: Eddie Reed., male   DOB: 1950-08-05, 72 y.o.   MRN: [id number]  Problem: Education: Goal: Knowledge of the prescribed therapeutic regimen will improve Outcome: Progressing Goal: Individualized Educational Video(s) Outcome: Progressing   Problem: Activity: Goal: Ability to avoid complications of mobility impairment will improve Outcome: Progressing Goal: Range of joint motion will improve Outcome: Progressing   Problem: Clinical Measurements: Goal: Postoperative complications will be avoided or minimized Outcome: Progressing   Problem: Pain Management: Goal: Pain level will decrease with appropriate interventions Outcome: Progressing   Problem: Skin Integrity: Goal: Will show signs of wound healing Outcome: Progressing   Problem: Education: Goal: Knowledge of General Education information will improve Description: Including pain rating scale, medication(s)/side effects and non-pharmacologic comfort measures Outcome: Progressing   Problem: Health Behavior/Discharge Planning: Goal: Ability to manage health-related needs will improve Outcome: Progressing   Problem: Clinical Measurements: Goal: Ability to maintain clinical measurements within normal limits will improve Outcome: Progressing Goal: Will remain free from infection Outcome: Progressing Goal: Diagnostic test results will improve Outcome: Progressing Goal: Respiratory complications will improve Outcome: Progressing Goal: Cardiovascular complication will be avoided Outcome: Progressing   Problem: Activity: Goal: Risk for activity intolerance will decrease Outcome: Progressing   Problem: Nutrition: Goal: Adequate nutrition will be maintained Outcome: Progressing   Problem: Coping: Goal: Level of anxiety will decrease Outcome: Progressing   Problem: Elimination: Goal: Will not experience complications related to bowel motility Outcome: Progressing Goal: Will not experience complications  related to urinary retention Outcome: Progressing   Problem: Pain Managment: Goal: General experience of comfort will improve Outcome: Progressing   Problem: Safety: Goal: Ability to remain free from injury will improve Outcome: Progressing   Problem: Skin Integrity: Goal: Risk for impaired skin integrity will decrease Outcome: Progressing    Lidia Collum, RN

## 2023-02-18 NOTE — Plan of Care (Signed)
  Problem: Education: Goal: Individualized Educational Video(s) Outcome: Progressing   Problem: Activity: Goal: Range of joint motion will improve Outcome: Progressing   Problem: Clinical Measurements: Goal: Postoperative complications will be avoided or minimized Outcome: Progressing   Problem: Pain Management: Goal: Pain level will decrease with appropriate interventions Outcome: Progressing   Problem: Skin Integrity: Goal: Will show signs of wound healing Outcome: Progressing   Problem: Education: Goal: Knowledge of General Education information will improve Description: Including pain rating scale, medication(s)/side effects and non-pharmacologic comfort measures Outcome: Progressing   Problem: Health Behavior/Discharge Planning: Goal: Ability to manage health-related needs will improve Outcome: Progressing   Problem: Clinical Measurements: Goal: Ability to maintain clinical measurements within normal limits will improve Outcome: Progressing Goal: Will remain free from infection Outcome: Progressing Goal: Diagnostic test results will improve Outcome: Progressing Goal: Respiratory complications will improve Outcome: Progressing Goal: Cardiovascular complication will be avoided Outcome: Progressing   Problem: Activity: Goal: Risk for activity intolerance will decrease Outcome: Progressing   Problem: Nutrition: Goal: Adequate nutrition will be maintained Outcome: Progressing   Problem: Elimination: Goal: Will not experience complications related to bowel motility Outcome: Progressing   Problem: Pain Managment: Goal: General experience of comfort will improve Outcome: Progressing   Problem: Safety: Goal: Ability to remain free from injury will improve Outcome: Progressing   Problem: Skin Integrity: Goal: Risk for impaired skin integrity will decrease Outcome: Progressing

## 2023-02-18 NOTE — Progress Notes (Signed)
Patient ID: Eddie Esty., male   DOB: 02-12-1951, 72 y.o.   MRN: [id number]   Subjective: 6 Days Post-Op Procedure(s) (LRB): LEFT TOTAL KNEE ARTHROPLASTY (Left) Patient reports pain as moderate.    Objective: Vital signs in last 24 hours: Temp:  [98.2 F (36.8 C)-98.7 F (37.1 C)] 98.2 F (36.8 C) (10/01 0812) Pulse Rate:  [96-110] 110 (10/01 0812) Resp:  [17-18] 17 (09/30 2044) BP: (123-136)/(61-74) 135/74 (10/01 0812) SpO2:  [92 %-95 %] 92 % (10/01 0812)  Intake/Output from previous day: 09/30 0701 - 10/01 0700 In: 720 [P.O.:720] Out: 900 [Urine:900] Intake/Output this shift: No intake/output data recorded.  No results for input(s): "HGB" in the last 72 hours. No results for input(s): "WBC", "RBC", "HCT", "PLT" in the last 72 hours. No results for input(s): "NA", "K", "CL", "CO2", "BUN", "CREATININE", "GLUCOSE", "CALCIUM" in the last 72 hours. No results for input(s): "LABPT", "INR" in the last 72 hours.  Slow progress with physical therapy walked 4 steps.  Pulmonary status limits his ability to progress with therapy.  Past smoker and sats running 89. No results found.  Assessment/Plan: 6 Days Post-Op Procedure(s) (LRB): LEFT TOTAL KNEE ARTHROPLASTY (Left) Plan: Continue therapy and plan for SNF placement.  Changed to inpatient.  [name] 02/18/2023, 8:50 AM

## 2023-02-18 NOTE — TOC Progression Note (Addendum)
Transition of Care (TOC) - Progression Note    Patient Details  Name: Eddie FARNELL Sr. MRN: [id number] Date of Birth: Oct 03, 1950  Transition of Care [facility]) CM/SW Contact  Lorri Frederick, LCSW Phone Number: 02/18/2023, 11:17 AM  Clinical Narrative:    CSW provided bed offers to daughter Jacki Cones.  She wants to accept offer at [facility].    CSW confirmed with [facility].  1600: Auth request submitted in [facility].   Son updated.   Expected Discharge Plan: Skilled Nursing Facility Barriers to Discharge: Continued Medical Work up  Expected Discharge Plan and Services In-house Referral: Clinical Social Work     Living arrangements for the past 2 months: Single Family Home                                       Social Determinants of Health (SDOH) Interventions SDOH Screenings   Food Insecurity: No Food Insecurity (02/12/2023)  Housing: Low Risk  (02/12/2023)  Transportation Needs: No Transportation Needs (02/12/2023)  Utilities: Not At Risk (02/12/2023)  Tobacco Use: High Risk (02/12/2023)  Health Literacy: Medium Risk (01/09/2021)   Received from [facility], [facility]    Readmission Risk Interventions     No data to display

## 2023-02-18 NOTE — Inpatient Diabetes Management (Signed)
Inpatient Diabetes Program Recommendations  AACE/ADA: New Consensus Statement on Inpatient Glycemic Control (2015)  Target Ranges:  Prepandial:   less than 140 mg/dL      Peak postprandial:   less than 180 mg/dL (1-2 hours)      Critically ill patients:  140 - 180 mg/dL   Lab Results  Component Value Date   GLUCAP 102 (H) 02/12/2023   HGBA1C 6.5 (H) 02/10/2023    Review of Glycemic Control  Diabetes history: DM2 Outpatient Diabetes medications: metformin 1000 mg BID Current orders for Inpatient glycemic control: metformin 1000 mg BID  HgbA1C 6.5%  Inpatient Diabetes Program Recommendations:    Consider adding Novolog 0-9 units TID  Follow.  Thank you. Ailene Ards, RD, LDN, CDCES Inpatient Diabetes Coordinator [phone]

## 2023-02-18 NOTE — Progress Notes (Signed)
Patient ID: Gerron Guidotti., male   DOB: [birth year], 72 y.o.   MRN: [id number]  Upon assessment noted reddened area approximately 18 cm long and 7 cm wide, blanchable in places, un blanchable in others, that extends from lower sacrum down throughout gluteal crease, with purple areas (9 cm x 2 cm) in the middle section. MD notified,PT has requested  air distrubution bed, and wound ostomy nurse is coming to assess.  Lidia Collum, RN

## 2023-02-18 NOTE — Progress Notes (Signed)
Physical Therapy Treatment Patient Details Name: Eddie MASCH Sr. MRN: [id number] DOB: [birth year] Today's Date: 02/18/2023   History of Present Illness Pt is 72 yo male who presents on 02/12/23 for L TKA. PMH: CAD, blind R eye, GERD, fibromyalgia, COPD, HTN, DM, R CVA    PT Comments  Pt making good progress towards his goals today, despite increased confusion especially with place and situation. Pt continues to be maxAx2 for bed mobility and transfers, but has progressed to modA with close chair follow for 8 feet. Pt with complaints of sacral pain, recommended to MD and RN air mattress for decreased pressure when in bed.  D/c plans remain appropriate at this time. PT will continue to follow acutely.   If plan is discharge home, recommend the following: Two people to help with walking and/or transfers;A lot of help with bathing/dressing/bathroom;Assistance with cooking/housework;Direct supervision/assist for medications management;Direct supervision/assist for financial management;Assist for transportation;Help with stairs or ramp for entrance;Supervision due to cognitive status   Can travel by private vehicle     No  Equipment Recommendations  None recommended by PT    Recommendations for Other Services OT consult     Precautions / Restrictions Precautions Precautions: Knee Precaution Booklet Issued: Yes (comment) Precaution Comments: reviewed knee precautions and gave ther ex handout but pt not retaining info today, will need relearning Required Braces or Orthoses: Knee Immobilizer - Left Knee Immobilizer - Left:  (For transfers or walking  (needs to work on flex/ext when up in recliner)) Restrictions Weight Bearing Restrictions: Yes LLE Weight Bearing: Weight bearing as tolerated     Mobility  Bed Mobility Overal bed mobility: Needs Assistance Bed Mobility: Supine to Sit     Supine to sit: Used rails, HOB elevated, Max assist, +2 for physical assistance     General bed  mobility comments: constant mutimodal cuing for sequencing including repeatedly placing hand on railing to assist in pulling to EoB, maxAx2 for pad scoot of hips to EoB and trunk to upright    Transfers Overall transfer level: Needs assistance Equipment used: Rolling walker (2 wheels) Transfers: Sit to/from Stand, Bed to chair/wheelchair/BSC Sit to Stand: Max assist, +2 physical assistance, From elevated surface           General transfer comment: continued constant cuing for sequencing, repeated placement of hand on bed to push off, maxAx2 for power up, difficulty getting L hip underneath him in forward flexed positon with KI on, cues for upright posture and assist to move L LE back under him    Ambulation/Gait Ambulation/Gait assistance: +2 physical assistance, Mod assist Gait Distance (Feet): 8 Feet Assistive device: Rolling walker (2 wheels)   Gait velocity: slowed Gait velocity interpretation: <1.31 ft/sec, indicative of household ambulator   General Gait Details: modAx2 for steadying with ambulation far enough away from bed to provide close chair follow, then able to progress with modA and close chair follow, cues for upright posture, proximity to RW and sequencing         Balance Overall balance assessment: Needs assistance Sitting-balance support: Feet supported, No upper extremity supported Sitting balance-Leahy Scale: Fair     Standing balance support: Bilateral upper extremity supported, During functional activity Standing balance-Leahy Scale: Zero                              Cognition Arousal: Alert Behavior During Therapy: Impulsive Overall Cognitive Status: Impaired/Different from baseline Area of Impairment: Orientation, Attention,  Memory, Following commands, Safety/judgement, Awareness, Problem solving                 Orientation Level: Disoriented to, Situation, Time, Place Current Attention Level: Focused Memory: Decreased recall  of precautions, Decreased short-term memory Following Commands: Follows one step commands consistently, Follows one step commands with increased time Safety/Judgement: Decreased awareness of safety, Decreased awareness of deficits Awareness: Intellectual Problem Solving: Decreased initiation, Difficulty sequencing, Requires verbal cues, Requires tactile cues, Slow processing General Comments: oriented to self, continues to be tangential, starts crying spontaneously at the end of session when he recalls dream he had about his sister, needs constant cuing for remaining on task,        Exercises Total Joint Exercises Ankle Circles/Pumps: AROM, Both, 10 reps Heel Slides: AAROM, Left, 5 reps Hip ABduction/ADduction: Left, 10 reps, AAROM Straight Leg Raises: Left, 10 reps, AAROM    General Comments General comments (skin integrity, edema, etc.): continues to complain of sacral pain, have placed cushions in his seat and attempted to educated on weightshifting but given current cognition likely will not remember, NT notified, also requested air mattress      Pertinent Vitals/Pain Pain Assessment Pain Assessment: Faces Faces Pain Scale: Hurts little more Pain Location: L knee with movement Pain Descriptors / Indicators: Aching, Discomfort, Grimacing, Guarding, Sore Pain Intervention(s): Limited activity within patient's tolerance, Monitored during session, Repositioned     PT Goals (current goals can now be found in the care plan section) Acute Rehab PT Goals Patient Stated Goal: return home PT Goal Formulation: With patient Time For Goal Achievement: 02/27/23 Potential to Achieve Goals: Fair Progress towards PT goals: Progressing toward goals    Frequency    Min 1X/week       AM-PAC PT "6 Clicks" Mobility   Outcome Measure  Help needed turning from your back to your side while in a flat bed without using bedrails?: Total Help needed moving from lying on your back to sitting  on the side of a flat bed without using bedrails?: Total Help needed moving to and from a bed to a chair (including a wheelchair)?: Total Help needed standing up from a chair using your arms (e.g., wheelchair or bedside chair)?: Total Help needed to walk in hospital room?: Total Help needed climbing 3-5 steps with a railing? : Total 6 Click Score: 6    End of Session Equipment Utilized During Treatment: Gait belt Activity Tolerance: Patient limited by pain Patient left: in bed;with call bell/phone within reach;with bed alarm set Nurse Communication: Mobility status PT Visit Diagnosis: Unsteadiness on feet (R26.81);Muscle weakness (generalized) (M62.81);Difficulty in walking, not elsewhere classified (R26.2);Pain Pain - Right/Left: Left Pain - part of body: Knee     Time: 1610-9604 PT Time Calculation (min) (ACUTE ONLY): 22 min  Charges:    $Therapeutic Exercise: 8-22 mins PT General Charges $$ ACUTE PT VISIT: 1 Visit                     Nazier Neyhart B. Beverely Risen PT, DPT Acute Rehabilitation Services Please use secure chat or  Call Office [phone]    Elon Alas [facility] 02/18/2023, 9:50 AM

## 2023-02-18 NOTE — Consult Note (Signed)
WOC Nurse Consult Note: Reason for Consult: was asked about care over the weekend for red bottom, discussed with bedside nursing again yesterday 9/30.  PT requested low air loss mattress today, patient was to be DC to home, now to be DC to SNF.  Nursing yesterday reported 2 blisters, covered with foam per the nursing skin care order set.  Wound type:Deep Tissue Pressure Injury; intergluteal cleft Pressure Injury POA: No Measurement:15cm x 6cm x 0.1cm Wound OZH:YQMV purple non blanchable tissue; dark centrally with evolution, superficial skin loss  Drainage (amount, consistency, odor) not able to assess, covered with moisture barrier cream Periwound:intact  Dressing procedure/placement/frequency:  Continue topical care with foam.   Add single layer of xeroform with next dressing change.   Add low air loss mattress for moisture management and pressure redistribution  Chair pressure redistribution pad in place in the room currently, should be sent with patient for use in SNF.  Notified Dr. Ophelia Charter of above.   WOC Nurse team will follow with you and see patient within 10 days for wound assessments.  Please notify WOC nurses of any acute changes in the wounds or any new areas of concern Eddie Reed [facility] MSN, RN,CWOCN, CNS, CWON-AP [phone]

## 2023-02-19 MED ORDER — TRAMADOL HCL 50 MG PO TABS: 50.0000 mg | ORAL_TABLET | Freq: Four times a day (QID) | ORAL | 0 refills | Status: DC | PRN

## 2023-02-19 NOTE — Plan of Care (Signed)

## 2023-02-19 NOTE — Progress Notes (Signed)
Physical Therapy Treatment Patient Details Name: Eddie ROH Sr. MRN: [id number] DOB: February 19, 1951 Today's Date: 02/19/2023   History of Present Illness Pt is 72 yo male who presents on 02/12/23 for L TKA. PMH: CAD, blind R eye, GERD, fibromyalgia, COPD, HTN, DM, R CVA    PT Comments  Pt supine in bed on entry, eager to get out of bed this morning. KI in place already so started session with transfer to chair. Pt continues to be limited in safe mobility by decreased cognition, especially in terms of lack of orientation to situation, as he keeps asking about the pain in his L LE. Also limited by pain and generalized weakness. Pt continues to be maxA for bed mobility. Pt unable to come to fully upright in RW with total A. With maxA able to come to standing in [place] with bed maximally elevated. Once in chair KI removed and there-ex performed. Pt requiring increased assistance with positioning even with pressure distribution pad in chair due to sacral wound. D/c plans remain appropriate.    If plan is discharge home, recommend the following: Two people to help with walking and/or transfers;A lot of help with bathing/dressing/bathroom;Assistance with cooking/housework;Direct supervision/assist for medications management;Direct supervision/assist for financial management;Assist for transportation;Help with stairs or ramp for entrance;Supervision due to cognitive status   Can travel by private vehicle     No  Equipment Recommendations  None recommended by PT    Recommendations for Other Services OT consult     Precautions / Restrictions Precautions Precautions: Knee Precaution Booklet Issued: Yes (comment) Precaution Comments: reviewed knee precautions and gave ther ex handout but pt not retaining info today, will need relearning Required Braces or Orthoses: Knee Immobilizer - Left Knee Immobilizer - Left:  (For transfers or walking  (needs to work on flex/ext when up in  recliner)) Restrictions Weight Bearing Restrictions: Yes LLE Weight Bearing: Weight bearing as tolerated     Mobility  Bed Mobility Overal bed mobility: Needs Assistance Bed Mobility: Supine to Sit     Supine to sit: Used rails, HOB elevated, Max assist, +2 for physical assistance Sit to supine: Max assist   General bed mobility comments: continues to need maximal multimodal cues for coming to EoB, HoB maximally elevated, placing pt hands on railing, provided maxA for pad scoot and L LE support with coming to EoB and lowering L LE to floor    Transfers Overall transfer level: Needs assistance Equipment used: Rolling walker (2 wheels) Transfers: Sit to/from Stand, Bed to chair/wheelchair/BSC Sit to Stand: From elevated surface, Total assist, Max assist, Via lift equipment           General transfer comment: provided total A for coming to upright and adjustment of L LE under him but pt unable to maintain safely for pivot to chair, sat back down and able to come to standing from maximally elevated bed to transfer to chair with [facility]    Ambulation/Gait               General Gait Details: unable today         Balance Overall balance assessment: Needs assistance Sitting-balance support: Feet supported, No upper extremity supported Sitting balance-Leahy Scale: Fair     Standing balance support: Bilateral upper extremity supported, During functional activity Standing balance-Leahy Scale: Zero                              Cognition Arousal: Alert  Behavior During Therapy: Impulsive Overall Cognitive Status: Impaired/Different from baseline Area of Impairment: Orientation, Attention, Memory, Following commands, Safety/judgement, Awareness, Problem solving                 Orientation Level: Disoriented to, Situation, Time, Place Current Attention Level: Focused Memory: Decreased recall of precautions, Decreased short-term memory Following  Commands: Follows one step commands consistently, Follows one step commands with increased time Safety/Judgement: Decreased awareness of safety, Decreased awareness of deficits Awareness: Intellectual Problem Solving: Decreased initiation, Difficulty sequencing, Requires verbal cues, Requires tactile cues, Slow processing General Comments: pt has lost his cell phone and is frustrated that he can not find it, assisted pt with calling phone and no ringing noted, pt can not remember when he used it last. pt continues to be confused about where he is and why he is having so much pain, pt reoriented        Exercises Total Joint Exercises Ankle Circles/Pumps: AROM, Both, 10 reps Heel Slides: AAROM, Left, 5 reps Hip ABduction/ADduction: Left, 10 reps, AAROM Straight Leg Raises: Left, 10 reps, AAROM    General Comments General comments (skin integrity, edema, etc.): requires maximal effort to place pt on pressure redistibution pad for sacrum and propping of L LE to reduce pain, KI on in bed and utilized during mobilization, removed once up in chair to be able to perform AAROM      Pertinent Vitals/Pain Pain Assessment Pain Assessment: Faces Faces Pain Scale: Hurts little more Pain Location: L knee with movement and sacrum Pain Descriptors / Indicators: Aching, Discomfort, Grimacing, Guarding, Sore Pain Intervention(s): Limited activity within patient's tolerance, Monitored during session, Repositioned     PT Goals (current goals can now be found in the care plan section) Acute Rehab PT Goals Patient Stated Goal: return home PT Goal Formulation: With patient Time For Goal Achievement: 02/27/23 Potential to Achieve Goals: Fair Progress towards PT goals: Progressing toward goals    Frequency    Min 1X/week       AM-PAC PT "6 Clicks" Mobility   Outcome Measure  Help needed turning from your back to your side while in a flat bed without using bedrails?: Total Help needed moving  from lying on your back to sitting on the side of a flat bed without using bedrails?: Total Help needed moving to and from a bed to a chair (including a wheelchair)?: Total Help needed standing up from a chair using your arms (e.g., wheelchair or bedside chair)?: Total Help needed to walk in hospital room?: Total Help needed climbing 3-5 steps with a railing? : Total 6 Click Score: 6    End of Session Equipment Utilized During Treatment: Gait belt Activity Tolerance: Patient limited by pain Patient left: in bed;with call bell/phone within reach;with bed alarm set Nurse Communication: Mobility status PT Visit Diagnosis: Unsteadiness on feet (R26.81);Muscle weakness (generalized) (M62.81);Difficulty in walking, not elsewhere classified (R26.2);Pain Pain - Right/Left: Left Pain - part of body: Knee     Time: 1660-6301 PT Time Calculation (min) (ACUTE ONLY): 29 min  Charges:    $Therapeutic Exercise: 8-22 mins $Therapeutic Activity: 8-22 mins PT General Charges $$ ACUTE PT VISIT: 1 Visit                     Aryahi Denzler B. Beverely Risen PT, DPT Acute Rehabilitation Services Please use secure chat or  Call Office [phone]    Elon Alas [facility] 02/19/2023, 11:44 AM

## 2023-02-19 NOTE — Progress Notes (Signed)
Occupational Therapy Treatment Patient Details Name: Eddie KOIS Sr. MRN: [id number] DOB: 1950/10/22 Today's Date: 02/19/2023   History of present illness Pt is 72 yo male who presents on 02/12/23 for L TKA. PMH: CAD, blind R eye, GERD, fibromyalgia, COPD, HTN, DM, R CVA   OT comments  Pt in good spirits, motivated to participate and improve. Pt had difficulty eating at bed level, not able to sit up enough in bed. Pt assisted to EOB, max A with hand rails and HHA for in/out of bed, able to sit at EOB for 30+ minutes performing feeding. Great improvement with BL UE's with feeding, able to perform with set up. Pt has R side vision loss, demonstrated good ability to visually scan to R side to find missing food items or utensils. Pt DC plan to postacute <3hrs/day still appropriate, will continue to see acutely.      If plan is discharge home, recommend the following:  Two people to help with walking and/or transfers;Two people to help with bathing/dressing/bathroom;Assistance with cooking/housework;Direct supervision/assist for medications management;Assistance with feeding;Assist for transportation;Help with stairs or ramp for entrance   Equipment Recommendations  Other (comment) (defer)    Recommendations for Other Services      Precautions / Restrictions Precautions Precautions: Knee Precaution Booklet Issued: Yes (comment) Precaution Comments: reviewed with PT Required Braces or Orthoses: Knee Immobilizer - Left Knee Immobilizer - Left: On when out of bed or walking Restrictions Weight Bearing Restrictions: Yes LLE Weight Bearing: Weight bearing as tolerated       Mobility Bed Mobility Overal bed mobility: Needs Assistance Bed Mobility: Supine to Sit     Supine to sit: Max assist, HOB elevated, Used rails     General bed mobility comments: max A using rails/HHA to assist to EOB.    Transfers Overall transfer level: Needs assistance                        Balance Overall balance assessment: Needs assistance Sitting-balance support: Feet supported, No upper extremity supported Sitting balance-Leahy Scale: Fair Sitting balance - Comments: able to sit on EOB to perform feeding, no LOB, good tolerance.                                   ADL either performed or assessed with clinical judgement   ADL Overall ADL's : Needs assistance/impaired Eating/Feeding: Set up;Sitting   Grooming: Moderate assistance;Sitting                                 General ADL Comments: Pt sat at EOB and ate dinner, able to perform with set up, had difficulty feeding self in bed, not able to sit up high enough to reach plate    Extremity/Trunk Assessment Upper Extremity Assessment Upper Extremity Assessment: Generalized weakness RUE Deficits / Details: Pt states prior surgery hit a median nerve and has slight numbness in R hand only RUE Sensation: history of peripheral neuropathy RUE Coordination: decreased fine motor;decreased gross motor LUE Deficits / Details: states minimal numbness to L hand, some stiffness in fingers, LUE Sensation: history of peripheral neuropathy LUE Coordination: decreased fine motor;decreased gross motor   Lower Extremity Assessment Lower Extremity Assessment: Defer to PT evaluation        Vision       Perception     Praxis  Cognition Arousal: Alert Behavior During Therapy: WFL for tasks assessed/performed Overall Cognitive Status: Impaired/Different from baseline Area of Impairment: Orientation, Attention, Memory, Following commands, Safety/judgement, Awareness, Problem solving                 Orientation Level: Disoriented to, Situation, Time, Place Current Attention Level: Focused Memory: Decreased recall of precautions, Decreased short-term memory Following Commands: Follows one step commands consistently, Follows one step commands with increased time Safety/Judgement:  Decreased awareness of safety, Decreased awareness of deficits Awareness: Intellectual Problem Solving: Decreased initiation, Difficulty sequencing, Requires verbal cues, Requires tactile cues, Slow processing General Comments: Pt does not remember working with PT earlier, able to recall he is going to SNF at some point.        Exercises      Shoulder Instructions       General Comments      Pertinent Vitals/ Pain       Pain Assessment Pain Assessment: Faces Faces Pain Scale: Hurts little more Pain Location: L knee with movement and sacrum Pain Descriptors / Indicators: Aching, Discomfort, Grimacing, Guarding, Sore Pain Intervention(s): Monitored during session  Home Living                                          Prior Functioning/Environment              Frequency  Min 1X/week        Progress Toward Goals  OT Goals(current goals can now be found in the care plan section)  Progress towards OT goals: Progressing toward goals  Acute Rehab OT Goals Patient Stated Goal: to improve strength and mobility OT Goal Formulation: With patient Time For Goal Achievement: 02/27/23 Potential to Achieve Goals: Fair ADL Goals Pt Will Perform Eating: with set-up;sitting;bed level Pt Will Perform Grooming: with set-up;sitting;bed level Pt Will Perform Upper Body Dressing: with supervision;with set-up;sitting Pt Will Transfer to Toilet: with min assist;bedside commode;stand pivot transfer  Plan      Co-evaluation                 AM-PAC OT "6 Clicks" Daily Activity     Outcome Measure   Help from another person eating meals?: A Little Help from another person taking care of personal grooming?: A Lot Help from another person toileting, which includes using toliet, bedpan, or urinal?: A Lot Help from another person bathing (including washing, rinsing, drying)?: A Lot Help from another person to put on and taking off regular upper body clothing?: A  Lot Help from another person to put on and taking off regular lower body clothing?: Total 6 Click Score: 12    End of Session Equipment Utilized During Treatment: Gait belt  OT Visit Diagnosis: Unsteadiness on feet (R26.81);Other abnormalities of gait and mobility (R26.89);Muscle weakness (generalized) (M62.81);Pain;Other symptoms and signs involving cognitive function Pain - Right/Left: Left Pain - part of body: Knee   Activity Tolerance Patient tolerated treatment well   Patient Left in bed;with call bell/phone within reach;with bed alarm set   Nurse Communication Mobility status        Time: 1655-1730 OT Time Calculation (min): 35 min  Charges: OT General Charges $OT Visit: 1 Visit OT Treatments $Self Care/Home Management : 23-37 mins  [place], OTR/L   Alexis Goodell 02/19/2023, 5:46 PM

## 2023-02-19 NOTE — Plan of Care (Signed)
Problem: Education: Goal: Individualized Educational Video(s) Outcome: Progressing   Problem: Activity: Goal: Range of joint motion will improve Outcome: Progressing   Problem: Clinical Measurements: Goal: Postoperative complications will be avoided or minimized Outcome: Progressing   Problem: Pain Management: Goal: Pain level will decrease with appropriate interventions Outcome: Progressing   Problem: Skin Integrity: Goal: Will show signs of wound healing Outcome: Progressing   Problem: Education: Goal: Knowledge of General Education information will improve Description: Including pain rating scale, medication(s)/side effects and non-pharmacologic comfort measures Outcome: Progressing   Problem: Health Behavior/Discharge Planning: Goal: Ability to manage health-related needs will improve Outcome: Progressing   Problem: Clinical Measurements: Goal: Ability to maintain clinical measurements within normal limits will improve Outcome: Progressing Goal: Will remain free from infection Outcome: Progressing Goal: Diagnostic test results will improve Outcome: Progressing Goal: Respiratory complications will improve Outcome: Progressing Goal: Cardiovascular complication will be avoided Outcome: Progressing   Problem: Activity: Goal: Risk for activity intolerance will decrease Outcome: Progressing   Problem: Nutrition: Goal: Adequate nutrition will be maintained Outcome: Progressing   Problem: Elimination: Goal: Will not experience complications related to bowel motility Outcome: Progressing   Problem: Pain Managment: Goal: General experience of comfort will improve Outcome: Progressing   Problem: Safety: Goal: Ability to remain free from injury will improve Outcome: Progressing   Problem: Skin Integrity: Goal: Risk for impaired skin integrity will decrease Outcome: Progressing

## 2023-02-19 NOTE — Progress Notes (Signed)
This nurse spoke with pt's son on the phone this evening to give an update. After receiving the update the son makes this nurse aware that the patient is missing his wallet. Family reports letting day shift nurse on 02/18/2023 aware. This nurse searched pt's room with no success of locating wallet. This nurse then made charge nurse Leotis Shames, RN aware of the missing wallet.

## 2023-02-19 NOTE — Progress Notes (Signed)
Pt cell phone returned to pt.

## 2023-02-19 NOTE — Progress Notes (Signed)
Patient ID: Eddie Reed., male   DOB: 1951-02-21, 72 y.o.   MRN: [id number]   Subjective: 7 Days Post-Op Procedure(s) (LRB): LEFT TOTAL KNEE ARTHROPLASTY (Left) Patient reports pain as moderate.    Objective: Vital signs in last 24 hours: Temp:  [97.6 F (36.4 C)-98.8 F (37.1 C)] 98.8 F (37.1 C) (10/02 0819) Pulse Rate:  [101-109] 109 (10/02 0819) Resp:  [16-18] 16 (10/02 0819) BP: (108-134)/(65-73) 116/73 (10/02 0819) SpO2:  [91 %-92 %] 92 % (10/02 0819)  Intake/Output from previous day: No intake/output data recorded. Intake/Output this shift: No intake/output data recorded.  Recent Labs    02/18/23 1052  HGB 12.9*   Recent Labs    02/18/23 1052  WBC 14.7*  RBC 4.31  HCT 39.1  PLT 335   Recent Labs    02/18/23 1052  NA 133*  K 4.8  CL 100  CO2 23  BUN 48*  CREATININE 1.25*  GLUCOSE 228*  CALCIUM 8.6*   No results for input(s): "LABPT", "INR" in the last 72 hours.  Neurologically intact No results found.  Assessment/Plan: 7 Days Post-Op Procedure(s) (LRB): LEFT TOTAL KNEE ARTHROPLASTY (Left) Discharge to SNF, bed available . Transfer to [facility] SNF today.   [name] 02/19/2023, 11:55 AM

## 2023-02-19 NOTE — TOC Progression Note (Addendum)
Transition of Care (TOC) - Progression Note    Patient Details  Name: Eddie GALENTINE Sr. MRN: [id number] Date of Birth: November 21, 1950  Transition of Care [facility]) CM/SW Contact  Lorri Frederick, LCSW Phone Number: 02/19/2023, 8:24 AM  Clinical Narrative:   SNF auth approved: [id number], [id number], 3 days: 10/2-10/4.   1200: CSW informed by [facility] that they cannot receive pt until tomorrow.    Expected Discharge Plan: Skilled Nursing Facility Barriers to Discharge: Continued Medical Work up  Expected Discharge Plan and Services In-house Referral: Clinical Social Work     Living arrangements for the past 2 months: Single Family Home                                       Social Determinants of Health (SDOH) Interventions SDOH Screenings   Food Insecurity: No Food Insecurity (02/12/2023)  Housing: Low Risk  (02/12/2023)  Transportation Needs: No Transportation Needs (02/12/2023)  Utilities: Not At Risk (02/12/2023)  Tobacco Use: High Risk (02/12/2023)  Health Literacy: Medium Risk (01/09/2021)   Received from [facility], [facility]    Readmission Risk Interventions     No data to display

## 2023-02-19 NOTE — Discharge Summary (Signed)
Physician Discharge Summary  Patient ID: Eddie [place] LUTH Sr. MRN: [id number] DOB/AGE: 73-Oct-1952 73 y.o.  Admit date: 02/12/2023 Discharge date: 02/19/2023  Admission Diagnoses: Left knee primary osteoarthritis  Discharge Diagnoses:  Principal Problem:   S/P total knee arthroplasty, left Active Problems:   Unilateral primary osteoarthritis, left knee   Pressure injury of skin with suspected deep tissue injury   Discharged Condition: stable  Hospital Course: Patient was admitted for left total knee arthroplasty after failed conservative treatment with tricompartmental degenerative arthritis.  He underwent adductor block spinal anesthesia.  He had been on pain management with Norco 10/325   4 tablets daily  prior to his hospitalization by Dr. Vernice Jefferson.  Patient had adductor block spinal anesthesia Exparel and Marcaine.  Postoperatively he became sedated with difficulty participating with physical therapy due to weakness requiring 2+ assistance getting up he is only walking for 5 steps.  He is eating well.  Patient developed some skin breakdown on his buttocks was seen by wound care team and patient was placed on a low air loss mattress for moisture management and pressure raise distribution.  He had a 15 cm x 6 cm x 0.1 cm dark purple nonblanchable tissue dark center with evolution with some superficial skin loss.  Patient was treated with topical care with foam Xeroform next dressing change.  He had a chair pressure redistribution pad in his room that we will go with him to the skilled facility for use when he goes to a chair.  Patient use the perioperative diabetic protocol.  Creatinine was 1.25 stable and preop was 1.39.  Hemoglobin was 14.2 preop and postop 12.9 stable.  We discussed transfer plans to SNF with patient's son and daughter.  Patient's pain medications were held except for Ultram which she received twice a day.  At discharge she will be on Ultram 1 p.o. every 6 as needed  pain.  Consults: OT, PT, social service consult for placement.  Significant Diagnostic Studies: Routine admission labs as mentioned above.  PCR was negative.  A1c prior to admission was 6.5.  Treatments: Total knee arthroplasty as listed above and wound care for his sacral skin problem.  Discharge Exam: Blood pressure 116/73, pulse (!) 109, temperature 98.8 F (37.1 C), resp. rate 16, height 5\' 10"  (1.778 m), weight 81.4 kg, SpO2 92%.   Disposition: Discharge disposition: 03-Skilled Nursing Facility     Patient to follow-up with Dr. Ophelia Charter in about a week in the [place] office across street from [facility] on Thursday.     Allergies as of 02/19/2023       Reactions   Plavix [clopidogrel] Hives   Levaquin [levofloxacin] Hives   Cymbalta [duloxetine Hcl]    Headache, shortness of breath   Paxil [paroxetine] Hives   Terbinafine Hcl Hives   Lamisil         Medication List     STOP taking these medications    aspirin EC 81 MG tablet   etanercept 50 MG/ML injection Commonly known as: ENBREL       TAKE these medications    albuterol 108 (90 Base) MCG/ACT inhaler Commonly known as: VENTOLIN HFA Inhale 2 puffs into the lungs 4 (four) times daily as needed for wheezing or shortness of breath.   ALPRAZolam 1 MG tablet Commonly known as: XANAX Take 1 mg by mouth 3 (three) times daily as needed for anxiety or sleep.   amLODipine 5 MG tablet Commonly known as: NORVASC Take 1 tablet (5 mg total) by  mouth daily.   atorvastatin 20 MG tablet Commonly known as: LIPITOR Take 20 mg by mouth at bedtime.   betamethasone dipropionate 0.05 % cream Apply 1 Application topically 2 (two) times daily as needed (irritation).   clobetasol 0.05 % topical foam Commonly known as: OLUX Apply 1 application  topically 2 (two) times daily as needed (psoriasis).   diclofenac sodium 1 % Gel Commonly known as: VOLTAREN Apply 2 g topically 3 (three) times daily as needed (joint  pain).   Entresto 24-26 MG Generic drug: sacubitril-valsartan TAKE 1 TABLET BY MOUTH TWICE DAILY (MUST SCHEDULE APPOINTMENT FOR MORE REFILLS)   esomeprazole 40 MG capsule Commonly known as: NEXIUM Take 40 mg by mouth daily as needed (acid reflux).   fluticasone 50 MCG/ACT nasal spray Commonly known as: FLONASE Place 2 sprays into both nostrils daily as needed for allergies.   gabapentin 800 MG tablet Commonly known as: NEURONTIN Take 1 tablet (800 mg total) by mouth 2 (two) times daily. What changed: when to take this   HYDROcodone-acetaminophen 10-325 MG tablet Commonly known as: NORCO Take 1 tablet by mouth 4 (four) times daily.   ketoconazole 2 % cream Commonly known as: NIZORAL Apply 1 Application topically 2 (two) times daily as needed for irritation.   metFORMIN 1000 MG tablet Commonly known as: GLUCOPHAGE Take 1,000 mg by mouth 2 (two) times daily.   metoprolol succinate 25 MG 24 hr tablet Commonly known as: TOPROL-XL TAKE 1/2 TABLET BY MOUTH EVERY DAY (pt needs APPOINTMENT FOR more refills) What changed: See the new instructions.   multivitamin tablet Take 1 tablet by mouth daily.   nitroGLYCERIN 0.4 MG SL tablet Commonly known as: NITROSTAT Place 0.4 mg under the tongue every 5 (five) minutes as needed for chest pain.   Primatene Mist 0.125 MG/ACT Aero Generic drug: EPINEPHrine Inhale 1-2 Inhalations into the lungs every 4 (four) hours as needed (Asthma symptoms).   silodosin 8 MG Caps capsule Commonly known as: RAPAFLO TAKE ONE CAPSULE BY MOUTH DAILY WITH BREAKFAST   spironolactone 25 MG tablet Commonly known as: ALDACTONE Take 1 tablet (25 mg total) by mouth daily.   traMADol 50 MG tablet Commonly known as: ULTRAM Take 1 tablet (50 mg total) by mouth every 6 (six) hours as needed for severe pain.   Vitamin D3 50 MCG (2000 UT) capsule Take 2,000 Units by mouth daily.        Follow-up Information     [name], MD Follow up in 1 week(s).    Specialty: Orthopedic Surgery Contact information: [address] [place] [address] [phone]                 Signed: [name] 02/19/2023, 12:02 PM

## 2023-02-20 ENCOUNTER — Encounter: Payer: 59 | Admitting: Orthopaedic Surgery

## 2023-02-20 MED ORDER — ASPIRIN 325 MG PO TBEC: 325.0000 mg | DELAYED_RELEASE_TABLET | Freq: Every day | ORAL | 0 refills | Status: DC

## 2023-02-20 MED ORDER — ALPRAZOLAM 1 MG PO TABS: 1.0000 mg | ORAL_TABLET | Freq: Three times a day (TID) | ORAL | 0 refills | Status: DC | PRN

## 2023-02-20 MED ORDER — TRAMADOL HCL 50 MG PO TABS: 50.0000 mg | ORAL_TABLET | Freq: Four times a day (QID) | ORAL | 0 refills | Status: AC | PRN

## 2023-02-20 NOTE — Progress Notes (Signed)
Report called to [facility], GAVE REPORT TO BRANDY

## 2023-02-20 NOTE — Progress Notes (Signed)
Physical Therapy Treatment Patient Details Name: Eddie STILSON Sr. MRN: [id number] DOB: Aug 07, 1950 Today's Date: 02/20/2023   History of Present Illness Pt is 72 yo male who presents on 02/12/23 for L TKA. PMH: CAD, blind R eye, GERD, fibromyalgia, COPD, HTN, DM, R CVA    PT Comments  Pt supine in bed complaining about sacral pain, agreeable to working with therapy. Pt to discharge this morning so session limited to bed mobility and supine and seated exercises. Pt limited by sacral pain and L knee pain, in presence of decreased cognition, and generalized weakness. Pt is maxA for coming to EoB. Able to sit unassisted to take morning medications, and perform seated exercise before returning to bed.     If plan is discharge home, recommend the following: Two people to help with walking and/or transfers;A lot of help with bathing/dressing/bathroom;Assistance with cooking/housework;Direct supervision/assist for medications management;Direct supervision/assist for financial management;Assist for transportation;Help with stairs or ramp for entrance;Supervision due to cognitive status   Can travel by private vehicle     No  Equipment Recommendations  None recommended by PT    Recommendations for Other Services OT consult     Precautions / Restrictions Precautions Precautions: Knee Precaution Booklet Issued: Yes (comment) Precaution Comments: reviewed knee precautions and gave ther ex handout but pt not retaining info today, will need relearning Required Braces or Orthoses: Knee Immobilizer - Left Knee Immobilizer - Left:  (For transfers or walking  (needs to work on flex/ext when up in recliner)) Restrictions Weight Bearing Restrictions: Yes LLE Weight Bearing: Weight bearing as tolerated     Mobility  Bed Mobility Overal bed mobility: Needs Assistance Bed Mobility: Supine to Sit     Supine to sit: Used rails, HOB elevated, Max assist, +2 for physical assistance Sit to supine: Total  assist   General bed mobility comments: maxA for pad scoot of hips and trunk to upright with use of bed rail and HoB elevated to get to EOB    Transfers                   General transfer comment: deferred due to immentent arrival of PTAR for d/c to SNF        Balance Overall balance assessment: Needs assistance Sitting-balance support: Feet supported, No upper extremity supported Sitting balance-Leahy Scale: Fair Sitting balance - Comments: able to sit EoB and take morning meds                                    Cognition Arousal: Alert Behavior During Therapy: Impulsive Overall Cognitive Status: Impaired/Different from baseline Area of Impairment: Orientation, Attention, Memory, Following commands, Safety/judgement, Awareness, Problem solving                 Orientation Level: Disoriented to, Situation, Time, Place Current Attention Level: Focused Memory: Decreased recall of precautions, Decreased short-term memory Following Commands: Follows one step commands consistently, Follows one step commands with increased time Safety/Judgement: Decreased awareness of safety, Decreased awareness of deficits Awareness: Intellectual Problem Solving: Decreased initiation, Difficulty sequencing, Requires verbal cues, Requires tactile cues, Slow processing General Comments: continues to be confused about situation, focuses on sacral pain and unable to get comfortable        Exercises Total Joint Exercises Ankle Circles/Pumps: AROM, Both, 10 reps Quad Sets: AROM, Both, Supine, 5 reps Heel Slides: AAROM, 5 reps, Both, Supine Hip ABduction/ADduction: Left, 10 reps,  AAROM Straight Leg Raises: Left, 10 reps, AAROM Long Arc Quad: AROM, Right, AAROM, Left, 10 reps, Seated Marching in Standing: Right, 10 reps, Left, Seated    General Comments  VSS on RA       Pertinent Vitals/Pain Pain Assessment Faces Pain Scale: Hurts little more Pain Location: L knee  with movement and sacrum Pain Descriptors / Indicators: Aching, Discomfort, Grimacing, Guarding, Sore     PT Goals (current goals can now be found in the care plan section) Acute Rehab PT Goals Patient Stated Goal: return home PT Goal Formulation: With patient Time For Goal Achievement: 02/27/23 Potential to Achieve Goals: Fair Progress towards PT goals: Progressing toward goals    Frequency    Min 1X/week       AM-PAC PT "6 Clicks" Mobility   Outcome Measure  Help needed turning from your back to your side while in a flat bed without using bedrails?: Total Help needed moving from lying on your back to sitting on the side of a flat bed without using bedrails?: Total Help needed moving to and from a bed to a chair (including a wheelchair)?: Total Help needed standing up from a chair using your arms (e.g., wheelchair or bedside chair)?: Total Help needed to walk in hospital room?: Total Help needed climbing 3-5 steps with a railing? : Total 6 Click Score: 6    End of Session Equipment Utilized During Treatment: Gait belt Activity Tolerance: Patient limited by pain Patient left: in bed;with call bell/phone within reach;with bed alarm set Nurse Communication: Mobility status PT Visit Diagnosis: Unsteadiness on feet (R26.81);Muscle weakness (generalized) (M62.81);Difficulty in walking, not elsewhere classified (R26.2);Pain Pain - Right/Left: Left Pain - part of body: Knee     Time: 8295-6213 PT Time Calculation (min) (ACUTE ONLY): 30 min  Charges:    $Therapeutic Exercise: 8-22 mins $Therapeutic Activity: 8-22 mins PT General Charges $$ ACUTE PT VISIT: 1 Visit                     Mitchael Luckey B. Beverely Risen PT, DPT Acute Rehabilitation Services Please use secure chat or  Call Office [phone]    Elon Alas Fleet 02/20/2023, 10:08 AM

## 2023-02-20 NOTE — TOC Transition Note (Addendum)
Transition of Care [facility]) - CM/SW Discharge Note   Patient Details  Name: Eddie CLAUD Sr. MRN: [id number] Date of Birth: 1951/03/17  Transition of Care [facility]) CM/SW Contact:  Lorri Frederick, LCSW Phone Number: 02/20/2023, 11:28 AM   Clinical Narrative:   Pt discharging to [facility].  RN call report to [phone].    1000: CSW confirmed with [facility] that they can receive pt today.     Final next level of care: Skilled Nursing Facility Barriers to Discharge: Barriers Resolved   Patient Goals and CMS Choice      Discharge Placement                Patient chooses bed at:  [facility]) Patient to be transferred to facility by: [facility] Name of family member notified: daughter Jacki Cones Patient and family notified of of transfer: 02/20/23  Discharge Plan and Services Additional resources added to the After Visit Summary for   In-house Referral: Clinical Social Work                                   Social Determinants of Health (SDOH) Interventions SDOH Screenings   Food Insecurity: No Food Insecurity (02/12/2023)  Housing: Low Risk  (02/12/2023)  Transportation Needs: No Transportation Needs (02/12/2023)  Utilities: Not At Risk (02/12/2023)  Tobacco Use: High Risk (02/12/2023)  Health Literacy: Medium Risk (01/09/2021)   Received from [facility], [facility]     Readmission Risk Interventions     No data to display

## 2023-02-20 NOTE — Plan of Care (Signed)
Problem: Education: Goal: Individualized Educational Video(s) Outcome: Progressing   Problem: Activity: Goal: Range of joint motion will improve Outcome: Progressing   Problem: Clinical Measurements: Goal: Postoperative complications will be avoided or minimized Outcome: Progressing   Problem: Pain Management: Goal: Pain level will decrease with appropriate interventions Outcome: Progressing   Problem: Skin Integrity: Goal: Will show signs of wound healing Outcome: Progressing   Problem: Education: Goal: Knowledge of General Education information will improve Description: Including pain rating scale, medication(s)/side effects and non-pharmacologic comfort measures Outcome: Progressing   Problem: Health Behavior/Discharge Planning: Goal: Ability to manage health-related needs will improve Outcome: Progressing   Problem: Clinical Measurements: Goal: Ability to maintain clinical measurements within normal limits will improve Outcome: Progressing Goal: Will remain free from infection Outcome: Progressing Goal: Diagnostic test results will improve Outcome: Progressing Goal: Respiratory complications will improve Outcome: Progressing Goal: Cardiovascular complication will be avoided Outcome: Progressing   Problem: Activity: Goal: Risk for activity intolerance will decrease Outcome: Progressing   Problem: Nutrition: Goal: Adequate nutrition will be maintained Outcome: Progressing   Problem: Elimination: Goal: Will not experience complications related to bowel motility Outcome: Progressing   Problem: Pain Managment: Goal: General experience of comfort will improve Outcome: Progressing   Problem: Safety: Goal: Ability to remain free from injury will improve Outcome: Progressing   Problem: Skin Integrity: Goal: Risk for impaired skin integrity will decrease Outcome: Progressing

## 2023-02-20 NOTE — Progress Notes (Signed)
Patient ID: Eddie Reed., male   DOB: 1950-08-12, 72 y.o.   MRN: [id number] D/c summary was updated, discussed with pt , he is ready for transfer to SNF.

## 2023-02-24 ENCOUNTER — Ambulatory Visit: Payer: 59 | Admitting: Physician Assistant

## 2023-02-27 ENCOUNTER — Encounter: Payer: 59 | Admitting: Orthopaedic Surgery

## 2023-03-04 DIAGNOSIS — E440 Moderate protein-calorie malnutrition: Secondary | ICD-10-CM

## 2023-03-04 DIAGNOSIS — E782 Mixed hyperlipidemia: Secondary | ICD-10-CM | POA: Diagnosis not present

## 2023-03-04 DIAGNOSIS — E1122 Type 2 diabetes mellitus with diabetic chronic kidney disease: Secondary | ICD-10-CM | POA: Diagnosis not present

## 2023-03-04 DIAGNOSIS — G629 Polyneuropathy, unspecified: Secondary | ICD-10-CM | POA: Diagnosis not present

## 2023-03-04 DIAGNOSIS — L89154 Pressure ulcer of sacral region, stage 4: Secondary | ICD-10-CM | POA: Diagnosis not present

## 2023-03-05 DIAGNOSIS — L89154 Pressure ulcer of sacral region, stage 4: Secondary | ICD-10-CM | POA: Diagnosis not present

## 2023-03-05 DIAGNOSIS — E1122 Type 2 diabetes mellitus with diabetic chronic kidney disease: Secondary | ICD-10-CM | POA: Diagnosis not present

## 2023-03-05 DIAGNOSIS — E782 Mixed hyperlipidemia: Secondary | ICD-10-CM | POA: Diagnosis not present

## 2023-03-05 DIAGNOSIS — G629 Polyneuropathy, unspecified: Secondary | ICD-10-CM | POA: Diagnosis not present

## 2023-03-06 DIAGNOSIS — L89154 Pressure ulcer of sacral region, stage 4: Secondary | ICD-10-CM | POA: Diagnosis not present

## 2023-03-06 DIAGNOSIS — G629 Polyneuropathy, unspecified: Secondary | ICD-10-CM | POA: Diagnosis not present

## 2023-03-06 DIAGNOSIS — E782 Mixed hyperlipidemia: Secondary | ICD-10-CM | POA: Diagnosis not present

## 2023-03-06 DIAGNOSIS — E1122 Type 2 diabetes mellitus with diabetic chronic kidney disease: Secondary | ICD-10-CM | POA: Diagnosis not present

## 2023-03-07 DIAGNOSIS — E1122 Type 2 diabetes mellitus with diabetic chronic kidney disease: Secondary | ICD-10-CM | POA: Diagnosis not present

## 2023-03-07 DIAGNOSIS — E782 Mixed hyperlipidemia: Secondary | ICD-10-CM | POA: Diagnosis not present

## 2023-03-07 DIAGNOSIS — L89154 Pressure ulcer of sacral region, stage 4: Secondary | ICD-10-CM | POA: Diagnosis not present

## 2023-03-07 DIAGNOSIS — G629 Polyneuropathy, unspecified: Secondary | ICD-10-CM | POA: Diagnosis not present

## 2023-03-08 DIAGNOSIS — E1122 Type 2 diabetes mellitus with diabetic chronic kidney disease: Secondary | ICD-10-CM | POA: Diagnosis not present

## 2023-03-08 DIAGNOSIS — L89154 Pressure ulcer of sacral region, stage 4: Secondary | ICD-10-CM | POA: Diagnosis not present

## 2023-03-08 DIAGNOSIS — G629 Polyneuropathy, unspecified: Secondary | ICD-10-CM | POA: Diagnosis not present

## 2023-03-08 DIAGNOSIS — E782 Mixed hyperlipidemia: Secondary | ICD-10-CM | POA: Diagnosis not present

## 2023-03-09 DIAGNOSIS — G629 Polyneuropathy, unspecified: Secondary | ICD-10-CM | POA: Diagnosis not present

## 2023-03-09 DIAGNOSIS — L89154 Pressure ulcer of sacral region, stage 4: Secondary | ICD-10-CM | POA: Diagnosis not present

## 2023-03-09 DIAGNOSIS — E782 Mixed hyperlipidemia: Secondary | ICD-10-CM | POA: Diagnosis not present

## 2023-03-09 DIAGNOSIS — E1122 Type 2 diabetes mellitus with diabetic chronic kidney disease: Secondary | ICD-10-CM | POA: Diagnosis not present

## 2023-03-10 DIAGNOSIS — E782 Mixed hyperlipidemia: Secondary | ICD-10-CM | POA: Diagnosis not present

## 2023-03-10 DIAGNOSIS — G629 Polyneuropathy, unspecified: Secondary | ICD-10-CM | POA: Diagnosis not present

## 2023-03-10 DIAGNOSIS — E440 Moderate protein-calorie malnutrition: Secondary | ICD-10-CM

## 2023-03-10 DIAGNOSIS — L89154 Pressure ulcer of sacral region, stage 4: Secondary | ICD-10-CM | POA: Diagnosis not present

## 2023-03-10 DIAGNOSIS — E1122 Type 2 diabetes mellitus with diabetic chronic kidney disease: Secondary | ICD-10-CM | POA: Diagnosis not present

## 2023-03-11 DIAGNOSIS — G629 Polyneuropathy, unspecified: Secondary | ICD-10-CM | POA: Diagnosis not present

## 2023-03-11 DIAGNOSIS — E1122 Type 2 diabetes mellitus with diabetic chronic kidney disease: Secondary | ICD-10-CM | POA: Diagnosis not present

## 2023-03-11 DIAGNOSIS — L89154 Pressure ulcer of sacral region, stage 4: Secondary | ICD-10-CM | POA: Diagnosis not present

## 2023-03-11 DIAGNOSIS — E782 Mixed hyperlipidemia: Secondary | ICD-10-CM | POA: Diagnosis not present

## 2023-03-12 DIAGNOSIS — G629 Polyneuropathy, unspecified: Secondary | ICD-10-CM | POA: Diagnosis not present

## 2023-03-12 DIAGNOSIS — E782 Mixed hyperlipidemia: Secondary | ICD-10-CM | POA: Diagnosis not present

## 2023-03-12 DIAGNOSIS — E1122 Type 2 diabetes mellitus with diabetic chronic kidney disease: Secondary | ICD-10-CM | POA: Diagnosis not present

## 2023-03-12 DIAGNOSIS — L89154 Pressure ulcer of sacral region, stage 4: Secondary | ICD-10-CM | POA: Diagnosis not present

## 2023-03-13 DIAGNOSIS — G629 Polyneuropathy, unspecified: Secondary | ICD-10-CM | POA: Diagnosis not present

## 2023-03-13 DIAGNOSIS — L89154 Pressure ulcer of sacral region, stage 4: Secondary | ICD-10-CM | POA: Diagnosis not present

## 2023-03-13 DIAGNOSIS — E1122 Type 2 diabetes mellitus with diabetic chronic kidney disease: Secondary | ICD-10-CM | POA: Diagnosis not present

## 2023-03-13 DIAGNOSIS — E782 Mixed hyperlipidemia: Secondary | ICD-10-CM | POA: Diagnosis not present

## 2023-03-14 DIAGNOSIS — E1122 Type 2 diabetes mellitus with diabetic chronic kidney disease: Secondary | ICD-10-CM | POA: Diagnosis not present

## 2023-03-14 DIAGNOSIS — L89154 Pressure ulcer of sacral region, stage 4: Secondary | ICD-10-CM | POA: Diagnosis not present

## 2023-03-14 DIAGNOSIS — G629 Polyneuropathy, unspecified: Secondary | ICD-10-CM | POA: Diagnosis not present

## 2023-03-14 DIAGNOSIS — E782 Mixed hyperlipidemia: Secondary | ICD-10-CM | POA: Diagnosis not present

## 2023-03-15 DIAGNOSIS — E1122 Type 2 diabetes mellitus with diabetic chronic kidney disease: Secondary | ICD-10-CM | POA: Diagnosis not present

## 2023-03-15 DIAGNOSIS — E782 Mixed hyperlipidemia: Secondary | ICD-10-CM | POA: Diagnosis not present

## 2023-03-15 DIAGNOSIS — E440 Moderate protein-calorie malnutrition: Secondary | ICD-10-CM

## 2023-03-15 DIAGNOSIS — G629 Polyneuropathy, unspecified: Secondary | ICD-10-CM | POA: Diagnosis not present

## 2023-03-15 DIAGNOSIS — L89154 Pressure ulcer of sacral region, stage 4: Secondary | ICD-10-CM | POA: Diagnosis not present

## 2023-03-16 DIAGNOSIS — G629 Polyneuropathy, unspecified: Secondary | ICD-10-CM | POA: Diagnosis not present

## 2023-03-16 DIAGNOSIS — E1122 Type 2 diabetes mellitus with diabetic chronic kidney disease: Secondary | ICD-10-CM | POA: Diagnosis not present

## 2023-03-16 DIAGNOSIS — E782 Mixed hyperlipidemia: Secondary | ICD-10-CM | POA: Diagnosis not present

## 2023-03-16 DIAGNOSIS — L89154 Pressure ulcer of sacral region, stage 4: Secondary | ICD-10-CM | POA: Diagnosis not present

## 2023-03-17 DIAGNOSIS — L89154 Pressure ulcer of sacral region, stage 4: Secondary | ICD-10-CM | POA: Diagnosis not present

## 2023-03-17 DIAGNOSIS — E782 Mixed hyperlipidemia: Secondary | ICD-10-CM | POA: Diagnosis not present

## 2023-03-17 DIAGNOSIS — G629 Polyneuropathy, unspecified: Secondary | ICD-10-CM | POA: Diagnosis not present

## 2023-03-17 DIAGNOSIS — E1122 Type 2 diabetes mellitus with diabetic chronic kidney disease: Secondary | ICD-10-CM | POA: Diagnosis not present

## 2023-03-17 DIAGNOSIS — E440 Moderate protein-calorie malnutrition: Secondary | ICD-10-CM

## 2023-03-18 DIAGNOSIS — L89154 Pressure ulcer of sacral region, stage 4: Secondary | ICD-10-CM | POA: Diagnosis not present

## 2023-03-18 DIAGNOSIS — E1122 Type 2 diabetes mellitus with diabetic chronic kidney disease: Secondary | ICD-10-CM | POA: Diagnosis not present

## 2023-03-18 DIAGNOSIS — E782 Mixed hyperlipidemia: Secondary | ICD-10-CM | POA: Diagnosis not present

## 2023-03-18 DIAGNOSIS — G629 Polyneuropathy, unspecified: Secondary | ICD-10-CM | POA: Diagnosis not present

## 2023-03-19 DIAGNOSIS — J9621 Acute and chronic respiratory failure with hypoxia: Secondary | ICD-10-CM | POA: Diagnosis not present

## 2023-03-19 DIAGNOSIS — E782 Mixed hyperlipidemia: Secondary | ICD-10-CM | POA: Diagnosis not present

## 2023-03-19 DIAGNOSIS — G6181 Chronic inflammatory demyelinating polyneuritis: Secondary | ICD-10-CM | POA: Diagnosis not present

## 2023-03-19 DIAGNOSIS — L89154 Pressure ulcer of sacral region, stage 4: Secondary | ICD-10-CM | POA: Diagnosis not present

## 2023-03-19 DIAGNOSIS — G629 Polyneuropathy, unspecified: Secondary | ICD-10-CM | POA: Diagnosis not present

## 2023-03-19 DIAGNOSIS — G9341 Metabolic encephalopathy: Secondary | ICD-10-CM | POA: Diagnosis not present

## 2023-03-19 DIAGNOSIS — J181 Lobar pneumonia, unspecified organism: Secondary | ICD-10-CM | POA: Diagnosis not present

## 2023-03-19 DIAGNOSIS — E1122 Type 2 diabetes mellitus with diabetic chronic kidney disease: Secondary | ICD-10-CM | POA: Diagnosis not present

## 2023-03-20 DIAGNOSIS — G9341 Metabolic encephalopathy: Secondary | ICD-10-CM | POA: Diagnosis not present

## 2023-03-20 DIAGNOSIS — J9621 Acute and chronic respiratory failure with hypoxia: Secondary | ICD-10-CM | POA: Diagnosis not present

## 2023-03-20 DIAGNOSIS — G6181 Chronic inflammatory demyelinating polyneuritis: Secondary | ICD-10-CM | POA: Diagnosis not present

## 2023-03-20 DIAGNOSIS — J181 Lobar pneumonia, unspecified organism: Secondary | ICD-10-CM | POA: Diagnosis not present

## 2023-03-20 DIAGNOSIS — L89154 Pressure ulcer of sacral region, stage 4: Secondary | ICD-10-CM | POA: Diagnosis not present

## 2023-03-20 DIAGNOSIS — E1122 Type 2 diabetes mellitus with diabetic chronic kidney disease: Secondary | ICD-10-CM | POA: Diagnosis not present

## 2023-03-20 DIAGNOSIS — G629 Polyneuropathy, unspecified: Secondary | ICD-10-CM | POA: Diagnosis not present

## 2023-03-20 DIAGNOSIS — E782 Mixed hyperlipidemia: Secondary | ICD-10-CM | POA: Diagnosis not present

## 2023-03-21 DIAGNOSIS — E782 Mixed hyperlipidemia: Secondary | ICD-10-CM | POA: Diagnosis not present

## 2023-03-21 DIAGNOSIS — G629 Polyneuropathy, unspecified: Secondary | ICD-10-CM | POA: Diagnosis not present

## 2023-03-21 DIAGNOSIS — L89154 Pressure ulcer of sacral region, stage 4: Secondary | ICD-10-CM | POA: Diagnosis not present

## 2023-03-21 DIAGNOSIS — E1122 Type 2 diabetes mellitus with diabetic chronic kidney disease: Secondary | ICD-10-CM | POA: Diagnosis not present

## 2023-03-22 DIAGNOSIS — G629 Polyneuropathy, unspecified: Secondary | ICD-10-CM | POA: Diagnosis not present

## 2023-03-22 DIAGNOSIS — E1122 Type 2 diabetes mellitus with diabetic chronic kidney disease: Secondary | ICD-10-CM | POA: Diagnosis not present

## 2023-03-22 DIAGNOSIS — L89154 Pressure ulcer of sacral region, stage 4: Secondary | ICD-10-CM | POA: Diagnosis not present

## 2023-03-22 DIAGNOSIS — E782 Mixed hyperlipidemia: Secondary | ICD-10-CM | POA: Diagnosis not present

## 2023-03-23 DIAGNOSIS — E782 Mixed hyperlipidemia: Secondary | ICD-10-CM | POA: Diagnosis not present

## 2023-03-23 DIAGNOSIS — L89154 Pressure ulcer of sacral region, stage 4: Secondary | ICD-10-CM | POA: Diagnosis not present

## 2023-03-23 DIAGNOSIS — E1122 Type 2 diabetes mellitus with diabetic chronic kidney disease: Secondary | ICD-10-CM | POA: Diagnosis not present

## 2023-03-23 DIAGNOSIS — G629 Polyneuropathy, unspecified: Secondary | ICD-10-CM | POA: Diagnosis not present

## 2023-03-24 DIAGNOSIS — E782 Mixed hyperlipidemia: Secondary | ICD-10-CM | POA: Diagnosis not present

## 2023-03-24 DIAGNOSIS — L89154 Pressure ulcer of sacral region, stage 4: Secondary | ICD-10-CM | POA: Diagnosis not present

## 2023-03-24 DIAGNOSIS — E440 Moderate protein-calorie malnutrition: Secondary | ICD-10-CM

## 2023-03-24 DIAGNOSIS — E1122 Type 2 diabetes mellitus with diabetic chronic kidney disease: Secondary | ICD-10-CM | POA: Diagnosis not present

## 2023-03-24 DIAGNOSIS — G629 Polyneuropathy, unspecified: Secondary | ICD-10-CM | POA: Diagnosis not present

## 2023-03-25 DIAGNOSIS — E782 Mixed hyperlipidemia: Secondary | ICD-10-CM | POA: Diagnosis not present

## 2023-03-25 DIAGNOSIS — L89154 Pressure ulcer of sacral region, stage 4: Secondary | ICD-10-CM | POA: Diagnosis not present

## 2023-03-25 DIAGNOSIS — E1122 Type 2 diabetes mellitus with diabetic chronic kidney disease: Secondary | ICD-10-CM | POA: Diagnosis not present

## 2023-03-25 DIAGNOSIS — G629 Polyneuropathy, unspecified: Secondary | ICD-10-CM | POA: Diagnosis not present

## 2023-03-26 DIAGNOSIS — L89154 Pressure ulcer of sacral region, stage 4: Secondary | ICD-10-CM | POA: Diagnosis not present

## 2023-03-26 DIAGNOSIS — G629 Polyneuropathy, unspecified: Secondary | ICD-10-CM | POA: Diagnosis not present

## 2023-03-26 DIAGNOSIS — E1122 Type 2 diabetes mellitus with diabetic chronic kidney disease: Secondary | ICD-10-CM | POA: Diagnosis not present

## 2023-03-26 DIAGNOSIS — E782 Mixed hyperlipidemia: Secondary | ICD-10-CM | POA: Diagnosis not present

## 2023-03-27 DIAGNOSIS — L89154 Pressure ulcer of sacral region, stage 4: Secondary | ICD-10-CM | POA: Diagnosis not present

## 2023-03-27 DIAGNOSIS — G629 Polyneuropathy, unspecified: Secondary | ICD-10-CM | POA: Diagnosis not present

## 2023-03-27 DIAGNOSIS — E782 Mixed hyperlipidemia: Secondary | ICD-10-CM | POA: Diagnosis not present

## 2023-03-27 DIAGNOSIS — E1122 Type 2 diabetes mellitus with diabetic chronic kidney disease: Secondary | ICD-10-CM | POA: Diagnosis not present

## 2023-03-28 DIAGNOSIS — E1122 Type 2 diabetes mellitus with diabetic chronic kidney disease: Secondary | ICD-10-CM | POA: Diagnosis not present

## 2023-03-28 DIAGNOSIS — G629 Polyneuropathy, unspecified: Secondary | ICD-10-CM | POA: Diagnosis not present

## 2023-03-28 DIAGNOSIS — L89154 Pressure ulcer of sacral region, stage 4: Secondary | ICD-10-CM | POA: Diagnosis not present

## 2023-03-28 DIAGNOSIS — E782 Mixed hyperlipidemia: Secondary | ICD-10-CM | POA: Diagnosis not present

## 2023-03-29 DIAGNOSIS — G629 Polyneuropathy, unspecified: Secondary | ICD-10-CM | POA: Diagnosis not present

## 2023-03-29 DIAGNOSIS — E440 Moderate protein-calorie malnutrition: Secondary | ICD-10-CM

## 2023-03-29 DIAGNOSIS — E1122 Type 2 diabetes mellitus with diabetic chronic kidney disease: Secondary | ICD-10-CM | POA: Diagnosis not present

## 2023-03-29 DIAGNOSIS — L89154 Pressure ulcer of sacral region, stage 4: Secondary | ICD-10-CM | POA: Diagnosis not present

## 2023-03-29 DIAGNOSIS — E782 Mixed hyperlipidemia: Secondary | ICD-10-CM | POA: Diagnosis not present

## 2023-03-30 DIAGNOSIS — G629 Polyneuropathy, unspecified: Secondary | ICD-10-CM | POA: Diagnosis not present

## 2023-03-30 DIAGNOSIS — E1122 Type 2 diabetes mellitus with diabetic chronic kidney disease: Secondary | ICD-10-CM | POA: Diagnosis not present

## 2023-03-30 DIAGNOSIS — L89154 Pressure ulcer of sacral region, stage 4: Secondary | ICD-10-CM | POA: Diagnosis not present

## 2023-03-30 DIAGNOSIS — E782 Mixed hyperlipidemia: Secondary | ICD-10-CM | POA: Diagnosis not present

## 2023-03-31 DIAGNOSIS — E782 Mixed hyperlipidemia: Secondary | ICD-10-CM | POA: Diagnosis not present

## 2023-03-31 DIAGNOSIS — G629 Polyneuropathy, unspecified: Secondary | ICD-10-CM | POA: Diagnosis not present

## 2023-03-31 DIAGNOSIS — E440 Moderate protein-calorie malnutrition: Secondary | ICD-10-CM

## 2023-03-31 DIAGNOSIS — L89154 Pressure ulcer of sacral region, stage 4: Secondary | ICD-10-CM | POA: Diagnosis not present

## 2023-03-31 DIAGNOSIS — E1122 Type 2 diabetes mellitus with diabetic chronic kidney disease: Secondary | ICD-10-CM | POA: Diagnosis not present

## 2023-04-01 DIAGNOSIS — E1122 Type 2 diabetes mellitus with diabetic chronic kidney disease: Secondary | ICD-10-CM | POA: Diagnosis not present

## 2023-04-01 DIAGNOSIS — E782 Mixed hyperlipidemia: Secondary | ICD-10-CM | POA: Diagnosis not present

## 2023-04-01 DIAGNOSIS — L89154 Pressure ulcer of sacral region, stage 4: Secondary | ICD-10-CM | POA: Diagnosis not present

## 2023-04-01 DIAGNOSIS — G629 Polyneuropathy, unspecified: Secondary | ICD-10-CM | POA: Diagnosis not present

## 2023-04-02 DIAGNOSIS — E440 Moderate protein-calorie malnutrition: Secondary | ICD-10-CM

## 2023-04-02 DIAGNOSIS — G629 Polyneuropathy, unspecified: Secondary | ICD-10-CM

## 2023-04-02 DIAGNOSIS — E1122 Type 2 diabetes mellitus with diabetic chronic kidney disease: Secondary | ICD-10-CM

## 2023-04-02 DIAGNOSIS — E782 Mixed hyperlipidemia: Secondary | ICD-10-CM

## 2023-04-02 DIAGNOSIS — L89154 Pressure ulcer of sacral region, stage 4: Secondary | ICD-10-CM

## 2023-04-03 DIAGNOSIS — E1122 Type 2 diabetes mellitus with diabetic chronic kidney disease: Secondary | ICD-10-CM | POA: Diagnosis not present

## 2023-04-03 DIAGNOSIS — L89154 Pressure ulcer of sacral region, stage 4: Secondary | ICD-10-CM | POA: Diagnosis not present

## 2023-04-03 DIAGNOSIS — G629 Polyneuropathy, unspecified: Secondary | ICD-10-CM | POA: Diagnosis not present

## 2023-04-03 DIAGNOSIS — E782 Mixed hyperlipidemia: Secondary | ICD-10-CM | POA: Diagnosis not present

## 2023-04-04 DIAGNOSIS — E782 Mixed hyperlipidemia: Secondary | ICD-10-CM | POA: Diagnosis not present

## 2023-04-04 DIAGNOSIS — G629 Polyneuropathy, unspecified: Secondary | ICD-10-CM | POA: Diagnosis not present

## 2023-04-04 DIAGNOSIS — L89154 Pressure ulcer of sacral region, stage 4: Secondary | ICD-10-CM | POA: Diagnosis not present

## 2023-04-04 DIAGNOSIS — E1122 Type 2 diabetes mellitus with diabetic chronic kidney disease: Secondary | ICD-10-CM | POA: Diagnosis not present

## 2023-04-05 DIAGNOSIS — L89154 Pressure ulcer of sacral region, stage 4: Secondary | ICD-10-CM

## 2023-04-05 DIAGNOSIS — E782 Mixed hyperlipidemia: Secondary | ICD-10-CM

## 2023-04-05 DIAGNOSIS — E1122 Type 2 diabetes mellitus with diabetic chronic kidney disease: Secondary | ICD-10-CM

## 2023-04-05 DIAGNOSIS — E440 Moderate protein-calorie malnutrition: Secondary | ICD-10-CM

## 2023-04-05 DIAGNOSIS — G629 Polyneuropathy, unspecified: Secondary | ICD-10-CM

## 2023-04-06 DIAGNOSIS — G629 Polyneuropathy, unspecified: Secondary | ICD-10-CM | POA: Diagnosis not present

## 2023-04-06 DIAGNOSIS — L89154 Pressure ulcer of sacral region, stage 4: Secondary | ICD-10-CM | POA: Diagnosis not present

## 2023-04-06 DIAGNOSIS — E782 Mixed hyperlipidemia: Secondary | ICD-10-CM | POA: Diagnosis not present

## 2023-04-06 DIAGNOSIS — E1122 Type 2 diabetes mellitus with diabetic chronic kidney disease: Secondary | ICD-10-CM | POA: Diagnosis not present

## 2023-04-07 DIAGNOSIS — E440 Moderate protein-calorie malnutrition: Secondary | ICD-10-CM

## 2023-04-07 DIAGNOSIS — E782 Mixed hyperlipidemia: Secondary | ICD-10-CM | POA: Diagnosis not present

## 2023-04-07 DIAGNOSIS — G629 Polyneuropathy, unspecified: Secondary | ICD-10-CM | POA: Diagnosis not present

## 2023-04-07 DIAGNOSIS — E1122 Type 2 diabetes mellitus with diabetic chronic kidney disease: Secondary | ICD-10-CM | POA: Diagnosis not present

## 2023-04-07 DIAGNOSIS — L89154 Pressure ulcer of sacral region, stage 4: Secondary | ICD-10-CM | POA: Diagnosis not present

## 2023-04-08 DIAGNOSIS — E782 Mixed hyperlipidemia: Secondary | ICD-10-CM | POA: Diagnosis not present

## 2023-04-08 DIAGNOSIS — E1122 Type 2 diabetes mellitus with diabetic chronic kidney disease: Secondary | ICD-10-CM | POA: Diagnosis not present

## 2023-04-08 DIAGNOSIS — G629 Polyneuropathy, unspecified: Secondary | ICD-10-CM | POA: Diagnosis not present

## 2023-04-08 DIAGNOSIS — L89154 Pressure ulcer of sacral region, stage 4: Secondary | ICD-10-CM | POA: Diagnosis not present

## 2023-04-09 DIAGNOSIS — L89154 Pressure ulcer of sacral region, stage 4: Secondary | ICD-10-CM | POA: Diagnosis not present

## 2023-04-09 DIAGNOSIS — E1122 Type 2 diabetes mellitus with diabetic chronic kidney disease: Secondary | ICD-10-CM | POA: Diagnosis not present

## 2023-04-09 DIAGNOSIS — G629 Polyneuropathy, unspecified: Secondary | ICD-10-CM | POA: Diagnosis not present

## 2023-04-09 DIAGNOSIS — E782 Mixed hyperlipidemia: Secondary | ICD-10-CM | POA: Diagnosis not present

## 2023-04-10 DIAGNOSIS — G629 Polyneuropathy, unspecified: Secondary | ICD-10-CM | POA: Diagnosis not present

## 2023-04-10 DIAGNOSIS — E1122 Type 2 diabetes mellitus with diabetic chronic kidney disease: Secondary | ICD-10-CM | POA: Diagnosis not present

## 2023-04-10 DIAGNOSIS — E782 Mixed hyperlipidemia: Secondary | ICD-10-CM | POA: Diagnosis not present

## 2023-04-10 DIAGNOSIS — L89154 Pressure ulcer of sacral region, stage 4: Secondary | ICD-10-CM | POA: Diagnosis not present

## 2023-04-11 DIAGNOSIS — G629 Polyneuropathy, unspecified: Secondary | ICD-10-CM | POA: Diagnosis not present

## 2023-04-11 DIAGNOSIS — E782 Mixed hyperlipidemia: Secondary | ICD-10-CM | POA: Diagnosis not present

## 2023-04-11 DIAGNOSIS — E1122 Type 2 diabetes mellitus with diabetic chronic kidney disease: Secondary | ICD-10-CM | POA: Diagnosis not present

## 2023-04-11 DIAGNOSIS — L89154 Pressure ulcer of sacral region, stage 4: Secondary | ICD-10-CM | POA: Diagnosis not present

## 2023-04-12 DIAGNOSIS — G629 Polyneuropathy, unspecified: Secondary | ICD-10-CM | POA: Diagnosis not present

## 2023-04-12 DIAGNOSIS — E782 Mixed hyperlipidemia: Secondary | ICD-10-CM | POA: Diagnosis not present

## 2023-04-12 DIAGNOSIS — E1122 Type 2 diabetes mellitus with diabetic chronic kidney disease: Secondary | ICD-10-CM | POA: Diagnosis not present

## 2023-04-12 DIAGNOSIS — L89154 Pressure ulcer of sacral region, stage 4: Secondary | ICD-10-CM | POA: Diagnosis not present

## 2023-04-13 DIAGNOSIS — G629 Polyneuropathy, unspecified: Secondary | ICD-10-CM | POA: Diagnosis not present

## 2023-04-13 DIAGNOSIS — L89154 Pressure ulcer of sacral region, stage 4: Secondary | ICD-10-CM | POA: Diagnosis not present

## 2023-04-13 DIAGNOSIS — E1122 Type 2 diabetes mellitus with diabetic chronic kidney disease: Secondary | ICD-10-CM | POA: Diagnosis not present

## 2023-04-13 DIAGNOSIS — E782 Mixed hyperlipidemia: Secondary | ICD-10-CM | POA: Diagnosis not present

## 2023-04-14 DIAGNOSIS — E440 Moderate protein-calorie malnutrition: Secondary | ICD-10-CM

## 2023-04-14 DIAGNOSIS — E1122 Type 2 diabetes mellitus with diabetic chronic kidney disease: Secondary | ICD-10-CM | POA: Diagnosis not present

## 2023-04-14 DIAGNOSIS — L89154 Pressure ulcer of sacral region, stage 4: Secondary | ICD-10-CM | POA: Diagnosis not present

## 2023-04-14 DIAGNOSIS — E782 Mixed hyperlipidemia: Secondary | ICD-10-CM | POA: Diagnosis not present

## 2023-04-14 DIAGNOSIS — G629 Polyneuropathy, unspecified: Secondary | ICD-10-CM | POA: Diagnosis not present

## 2023-04-15 DIAGNOSIS — G629 Polyneuropathy, unspecified: Secondary | ICD-10-CM | POA: Diagnosis not present

## 2023-04-15 DIAGNOSIS — L89154 Pressure ulcer of sacral region, stage 4: Secondary | ICD-10-CM | POA: Diagnosis not present

## 2023-04-15 DIAGNOSIS — E1122 Type 2 diabetes mellitus with diabetic chronic kidney disease: Secondary | ICD-10-CM | POA: Diagnosis not present

## 2023-04-15 DIAGNOSIS — E782 Mixed hyperlipidemia: Secondary | ICD-10-CM | POA: Diagnosis not present

## 2023-04-16 DIAGNOSIS — G629 Polyneuropathy, unspecified: Secondary | ICD-10-CM | POA: Diagnosis not present

## 2023-04-16 DIAGNOSIS — E782 Mixed hyperlipidemia: Secondary | ICD-10-CM | POA: Diagnosis not present

## 2023-04-16 DIAGNOSIS — E1122 Type 2 diabetes mellitus with diabetic chronic kidney disease: Secondary | ICD-10-CM | POA: Diagnosis not present

## 2023-04-16 DIAGNOSIS — L89154 Pressure ulcer of sacral region, stage 4: Secondary | ICD-10-CM | POA: Diagnosis not present

## 2023-04-17 DIAGNOSIS — G629 Polyneuropathy, unspecified: Secondary | ICD-10-CM | POA: Diagnosis not present

## 2023-04-17 DIAGNOSIS — E782 Mixed hyperlipidemia: Secondary | ICD-10-CM | POA: Diagnosis not present

## 2023-04-17 DIAGNOSIS — E1122 Type 2 diabetes mellitus with diabetic chronic kidney disease: Secondary | ICD-10-CM | POA: Diagnosis not present

## 2023-04-17 DIAGNOSIS — L89154 Pressure ulcer of sacral region, stage 4: Secondary | ICD-10-CM | POA: Diagnosis not present

## 2023-04-18 DIAGNOSIS — G629 Polyneuropathy, unspecified: Secondary | ICD-10-CM | POA: Diagnosis not present

## 2023-04-18 DIAGNOSIS — E782 Mixed hyperlipidemia: Secondary | ICD-10-CM | POA: Diagnosis not present

## 2023-04-18 DIAGNOSIS — E1122 Type 2 diabetes mellitus with diabetic chronic kidney disease: Secondary | ICD-10-CM | POA: Diagnosis not present

## 2023-04-18 DIAGNOSIS — L89154 Pressure ulcer of sacral region, stage 4: Secondary | ICD-10-CM | POA: Diagnosis not present

## 2023-04-19 DIAGNOSIS — G629 Polyneuropathy, unspecified: Secondary | ICD-10-CM | POA: Diagnosis not present

## 2023-04-19 DIAGNOSIS — E782 Mixed hyperlipidemia: Secondary | ICD-10-CM | POA: Diagnosis not present

## 2023-04-19 DIAGNOSIS — L89154 Pressure ulcer of sacral region, stage 4: Secondary | ICD-10-CM | POA: Diagnosis not present

## 2023-04-19 DIAGNOSIS — E1122 Type 2 diabetes mellitus with diabetic chronic kidney disease: Secondary | ICD-10-CM | POA: Diagnosis not present

## 2023-04-20 DIAGNOSIS — E1122 Type 2 diabetes mellitus with diabetic chronic kidney disease: Secondary | ICD-10-CM | POA: Diagnosis not present

## 2023-04-20 DIAGNOSIS — E782 Mixed hyperlipidemia: Secondary | ICD-10-CM | POA: Diagnosis not present

## 2023-04-20 DIAGNOSIS — G629 Polyneuropathy, unspecified: Secondary | ICD-10-CM | POA: Diagnosis not present

## 2023-04-20 DIAGNOSIS — L89154 Pressure ulcer of sacral region, stage 4: Secondary | ICD-10-CM | POA: Diagnosis not present

## 2023-04-21 DIAGNOSIS — E782 Mixed hyperlipidemia: Secondary | ICD-10-CM | POA: Diagnosis not present

## 2023-04-21 DIAGNOSIS — E1122 Type 2 diabetes mellitus with diabetic chronic kidney disease: Secondary | ICD-10-CM | POA: Diagnosis not present

## 2023-04-21 DIAGNOSIS — G629 Polyneuropathy, unspecified: Secondary | ICD-10-CM | POA: Diagnosis not present

## 2023-04-21 DIAGNOSIS — E440 Moderate protein-calorie malnutrition: Secondary | ICD-10-CM

## 2023-04-21 DIAGNOSIS — L89154 Pressure ulcer of sacral region, stage 4: Secondary | ICD-10-CM | POA: Diagnosis not present

## 2023-04-22 DIAGNOSIS — E782 Mixed hyperlipidemia: Secondary | ICD-10-CM | POA: Diagnosis not present

## 2023-04-22 DIAGNOSIS — L89154 Pressure ulcer of sacral region, stage 4: Secondary | ICD-10-CM | POA: Diagnosis not present

## 2023-04-22 DIAGNOSIS — G629 Polyneuropathy, unspecified: Secondary | ICD-10-CM | POA: Diagnosis not present

## 2023-04-22 DIAGNOSIS — E1122 Type 2 diabetes mellitus with diabetic chronic kidney disease: Secondary | ICD-10-CM | POA: Diagnosis not present

## 2023-04-23 DIAGNOSIS — E782 Mixed hyperlipidemia: Secondary | ICD-10-CM | POA: Diagnosis not present

## 2023-04-23 DIAGNOSIS — E1122 Type 2 diabetes mellitus with diabetic chronic kidney disease: Secondary | ICD-10-CM | POA: Diagnosis not present

## 2023-04-23 DIAGNOSIS — L89154 Pressure ulcer of sacral region, stage 4: Secondary | ICD-10-CM | POA: Diagnosis not present

## 2023-04-23 DIAGNOSIS — G629 Polyneuropathy, unspecified: Secondary | ICD-10-CM | POA: Diagnosis not present

## 2023-04-24 DIAGNOSIS — E782 Mixed hyperlipidemia: Secondary | ICD-10-CM | POA: Diagnosis not present

## 2023-04-24 DIAGNOSIS — E1122 Type 2 diabetes mellitus with diabetic chronic kidney disease: Secondary | ICD-10-CM | POA: Diagnosis not present

## 2023-04-24 DIAGNOSIS — L89154 Pressure ulcer of sacral region, stage 4: Secondary | ICD-10-CM | POA: Diagnosis not present

## 2023-04-24 DIAGNOSIS — G629 Polyneuropathy, unspecified: Secondary | ICD-10-CM | POA: Diagnosis not present

## 2023-04-25 DIAGNOSIS — G629 Polyneuropathy, unspecified: Secondary | ICD-10-CM | POA: Diagnosis not present

## 2023-04-25 DIAGNOSIS — E1122 Type 2 diabetes mellitus with diabetic chronic kidney disease: Secondary | ICD-10-CM | POA: Diagnosis not present

## 2023-04-25 DIAGNOSIS — E782 Mixed hyperlipidemia: Secondary | ICD-10-CM | POA: Diagnosis not present

## 2023-04-25 DIAGNOSIS — L89154 Pressure ulcer of sacral region, stage 4: Secondary | ICD-10-CM | POA: Diagnosis not present

## 2023-04-26 DIAGNOSIS — L89154 Pressure ulcer of sacral region, stage 4: Secondary | ICD-10-CM | POA: Diagnosis not present

## 2023-04-26 DIAGNOSIS — E1122 Type 2 diabetes mellitus with diabetic chronic kidney disease: Secondary | ICD-10-CM | POA: Diagnosis not present

## 2023-04-26 DIAGNOSIS — E440 Moderate protein-calorie malnutrition: Secondary | ICD-10-CM

## 2023-04-26 DIAGNOSIS — G629 Polyneuropathy, unspecified: Secondary | ICD-10-CM | POA: Diagnosis not present

## 2023-04-26 DIAGNOSIS — E782 Mixed hyperlipidemia: Secondary | ICD-10-CM | POA: Diagnosis not present

## 2023-04-27 DIAGNOSIS — E1122 Type 2 diabetes mellitus with diabetic chronic kidney disease: Secondary | ICD-10-CM | POA: Diagnosis not present

## 2023-04-27 DIAGNOSIS — E782 Mixed hyperlipidemia: Secondary | ICD-10-CM | POA: Diagnosis not present

## 2023-04-27 DIAGNOSIS — L89154 Pressure ulcer of sacral region, stage 4: Secondary | ICD-10-CM | POA: Diagnosis not present

## 2023-04-27 DIAGNOSIS — G629 Polyneuropathy, unspecified: Secondary | ICD-10-CM | POA: Diagnosis not present

## 2023-04-28 DIAGNOSIS — E440 Moderate protein-calorie malnutrition: Secondary | ICD-10-CM

## 2023-04-28 DIAGNOSIS — G629 Polyneuropathy, unspecified: Secondary | ICD-10-CM | POA: Diagnosis not present

## 2023-04-28 DIAGNOSIS — L89154 Pressure ulcer of sacral region, stage 4: Secondary | ICD-10-CM | POA: Diagnosis not present

## 2023-04-28 DIAGNOSIS — E1122 Type 2 diabetes mellitus with diabetic chronic kidney disease: Secondary | ICD-10-CM | POA: Diagnosis not present

## 2023-04-28 DIAGNOSIS — E782 Mixed hyperlipidemia: Secondary | ICD-10-CM | POA: Diagnosis not present

## 2023-05-07 ENCOUNTER — Emergency Department (HOSPITAL_COMMUNITY): Payer: 59

## 2023-05-07 ENCOUNTER — Inpatient Hospital Stay (HOSPITAL_COMMUNITY)
Admission: EM | Admit: 2023-05-07 | Discharge: 2023-05-12 | DRG: 698 | Disposition: A | Discharge status: Hospice | Payer: 59 | Attending: Internal Medicine | Admitting: Internal Medicine

## 2023-05-07 DIAGNOSIS — Z96652 Presence of left artificial knee joint: Secondary | ICD-10-CM | POA: Diagnosis present

## 2023-05-07 DIAGNOSIS — L97219 Non-pressure chronic ulcer of right calf with unspecified severity: Secondary | ICD-10-CM | POA: Diagnosis present

## 2023-05-07 DIAGNOSIS — Z951 Presence of aortocoronary bypass graft: Secondary | ICD-10-CM

## 2023-05-07 DIAGNOSIS — R64 Cachexia: Secondary | ICD-10-CM | POA: Diagnosis present

## 2023-05-07 DIAGNOSIS — Y846 Urinary catheterization as the cause of abnormal reaction of the patient, or of later complication, without mention of misadventure at the time of the procedure: Secondary | ICD-10-CM | POA: Diagnosis present

## 2023-05-07 DIAGNOSIS — Z809 Family history of malignant neoplasm, unspecified: Secondary | ICD-10-CM

## 2023-05-07 DIAGNOSIS — M4628 Osteomyelitis of vertebra, sacral and sacrococcygeal region: Secondary | ICD-10-CM | POA: Diagnosis present

## 2023-05-07 DIAGNOSIS — T83518A Infection and inflammatory reaction due to other urinary catheter, initial encounter: Principal | ICD-10-CM | POA: Diagnosis present

## 2023-05-07 DIAGNOSIS — Z79899 Other long term (current) drug therapy: Secondary | ICD-10-CM

## 2023-05-07 DIAGNOSIS — L4050 Arthropathic psoriasis, unspecified: Secondary | ICD-10-CM | POA: Diagnosis present

## 2023-05-07 DIAGNOSIS — Z931 Gastrostomy status: Secondary | ICD-10-CM | POA: Diagnosis not present

## 2023-05-07 DIAGNOSIS — K219 Gastro-esophageal reflux disease without esophagitis: Secondary | ICD-10-CM | POA: Diagnosis present

## 2023-05-07 DIAGNOSIS — J449 Chronic obstructive pulmonary disease, unspecified: Secondary | ICD-10-CM | POA: Diagnosis present

## 2023-05-07 DIAGNOSIS — R54 Age-related physical debility: Secondary | ICD-10-CM | POA: Diagnosis present

## 2023-05-07 DIAGNOSIS — E1142 Type 2 diabetes mellitus with diabetic polyneuropathy: Secondary | ICD-10-CM | POA: Diagnosis present

## 2023-05-07 DIAGNOSIS — Z66 Do not resuscitate: Secondary | ICD-10-CM | POA: Diagnosis present

## 2023-05-07 DIAGNOSIS — Z515 Encounter for palliative care: Secondary | ICD-10-CM | POA: Diagnosis not present

## 2023-05-07 DIAGNOSIS — R4182 Altered mental status, unspecified: Secondary | ICD-10-CM | POA: Diagnosis present

## 2023-05-07 DIAGNOSIS — Z794 Long term (current) use of insulin: Secondary | ICD-10-CM | POA: Diagnosis not present

## 2023-05-07 DIAGNOSIS — Z933 Colostomy status: Secondary | ICD-10-CM

## 2023-05-07 DIAGNOSIS — G9341 Metabolic encephalopathy: Secondary | ICD-10-CM | POA: Diagnosis present

## 2023-05-07 DIAGNOSIS — N3090 Cystitis, unspecified without hematuria: Secondary | ICD-10-CM | POA: Diagnosis not present

## 2023-05-07 DIAGNOSIS — E1169 Type 2 diabetes mellitus with other specified complication: Secondary | ICD-10-CM | POA: Diagnosis present

## 2023-05-07 DIAGNOSIS — M869 Osteomyelitis, unspecified: Principal | ICD-10-CM | POA: Diagnosis present

## 2023-05-07 DIAGNOSIS — E785 Hyperlipidemia, unspecified: Secondary | ICD-10-CM | POA: Diagnosis present

## 2023-05-07 DIAGNOSIS — F419 Anxiety disorder, unspecified: Secondary | ICD-10-CM | POA: Diagnosis present

## 2023-05-07 DIAGNOSIS — Z7189 Other specified counseling: Secondary | ICD-10-CM | POA: Diagnosis not present

## 2023-05-07 DIAGNOSIS — E1151 Type 2 diabetes mellitus with diabetic peripheral angiopathy without gangrene: Secondary | ICD-10-CM | POA: Diagnosis present

## 2023-05-07 DIAGNOSIS — E871 Hypo-osmolality and hyponatremia: Secondary | ICD-10-CM | POA: Diagnosis present

## 2023-05-07 DIAGNOSIS — M797 Fibromyalgia: Secondary | ICD-10-CM | POA: Diagnosis present

## 2023-05-07 DIAGNOSIS — Z833 Family history of diabetes mellitus: Secondary | ICD-10-CM

## 2023-05-07 DIAGNOSIS — R6521 Severe sepsis with septic shock: Secondary | ICD-10-CM | POA: Diagnosis present

## 2023-05-07 DIAGNOSIS — L89154 Pressure ulcer of sacral region, stage 4: Secondary | ICD-10-CM | POA: Diagnosis present

## 2023-05-07 DIAGNOSIS — E43 Unspecified severe protein-calorie malnutrition: Secondary | ICD-10-CM | POA: Diagnosis present

## 2023-05-07 DIAGNOSIS — E872 Acidosis, unspecified: Secondary | ICD-10-CM | POA: Diagnosis present

## 2023-05-07 DIAGNOSIS — E861 Hypovolemia: Secondary | ICD-10-CM | POA: Diagnosis present

## 2023-05-07 DIAGNOSIS — J9601 Acute respiratory failure with hypoxia: Secondary | ICD-10-CM | POA: Diagnosis present

## 2023-05-07 DIAGNOSIS — E1165 Type 2 diabetes mellitus with hyperglycemia: Secondary | ICD-10-CM | POA: Diagnosis present

## 2023-05-07 DIAGNOSIS — F32A Depression, unspecified: Secondary | ICD-10-CM | POA: Diagnosis present

## 2023-05-07 DIAGNOSIS — R3915 Urgency of urination: Secondary | ICD-10-CM | POA: Diagnosis present

## 2023-05-07 DIAGNOSIS — Z8249 Family history of ischemic heart disease and other diseases of the circulatory system: Secondary | ICD-10-CM

## 2023-05-07 DIAGNOSIS — I2510 Atherosclerotic heart disease of native coronary artery without angina pectoris: Secondary | ICD-10-CM | POA: Diagnosis present

## 2023-05-07 DIAGNOSIS — A419 Sepsis, unspecified organism: Secondary | ICD-10-CM | POA: Diagnosis present

## 2023-05-07 DIAGNOSIS — N401 Enlarged prostate with lower urinary tract symptoms: Secondary | ICD-10-CM | POA: Diagnosis present

## 2023-05-07 DIAGNOSIS — Z7982 Long term (current) use of aspirin: Secondary | ICD-10-CM

## 2023-05-07 DIAGNOSIS — H5461 Unqualified visual loss, right eye, normal vision left eye: Secondary | ICD-10-CM | POA: Diagnosis present

## 2023-05-07 DIAGNOSIS — I10 Essential (primary) hypertension: Secondary | ICD-10-CM | POA: Diagnosis present

## 2023-05-07 DIAGNOSIS — Z888 Allergy status to other drugs, medicaments and biological substances status: Secondary | ICD-10-CM

## 2023-05-07 DIAGNOSIS — Z6824 Body mass index (BMI) 24.0-24.9, adult: Secondary | ICD-10-CM

## 2023-05-07 DIAGNOSIS — E8809 Other disorders of plasma-protein metabolism, not elsewhere classified: Secondary | ICD-10-CM | POA: Diagnosis present

## 2023-05-07 DIAGNOSIS — M8668 Other chronic osteomyelitis, other site: Secondary | ICD-10-CM | POA: Diagnosis not present

## 2023-05-07 DIAGNOSIS — Z8673 Personal history of transient ischemic attack (TIA), and cerebral infarction without residual deficits: Secondary | ICD-10-CM

## 2023-05-07 DIAGNOSIS — F17210 Nicotine dependence, cigarettes, uncomplicated: Secondary | ICD-10-CM | POA: Diagnosis present

## 2023-05-07 LAB — CBC WITH DIFFERENTIAL/PLATELET
Abs Immature Granulocytes: 0.13 10*3/uL — ABNORMAL HIGH (ref 0.00–0.07)
Basophils Absolute: 0 10*3/uL (ref 0.0–0.1)
Basophils Relative: 0 %
Eosinophils Absolute: 0.1 10*3/uL (ref 0.0–0.5)
Eosinophils Relative: 0 %
HCT: 31.5 % — ABNORMAL LOW (ref 39.0–52.0)
Hemoglobin: 9.9 g/dL — ABNORMAL LOW (ref 13.0–17.0)
Immature Granulocytes: 1 %
Lymphocytes Relative: 9 %
Lymphs Abs: 1.9 10*3/uL (ref 0.7–4.0)
MCH: 26.5 pg (ref 26.0–34.0)
MCHC: 31.4 g/dL (ref 30.0–36.0)
MCV: 84.2 fL (ref 80.0–100.0)
Monocytes Absolute: 2.6 10*3/uL — ABNORMAL HIGH (ref 0.1–1.0)
Monocytes Relative: 13 %
Neutro Abs: 15.9 10*3/uL — ABNORMAL HIGH (ref 1.7–7.7)
Neutrophils Relative %: 77 %
Platelets: 443 10*3/uL — ABNORMAL HIGH (ref 150–400)
RBC: 3.74 MIL/uL — ABNORMAL LOW (ref 4.22–5.81)
RDW: 15.8 % — ABNORMAL HIGH (ref 11.5–15.5)
WBC: 20.6 10*3/uL — ABNORMAL HIGH (ref 4.0–10.5)
nRBC: 0 % (ref 0.0–0.2)

## 2023-05-07 LAB — URINALYSIS, W/ REFLEX TO CULTURE (INFECTION SUSPECTED)
Bilirubin Urine: NEGATIVE
Glucose, UA: NEGATIVE mg/dL
Hgb urine dipstick: NEGATIVE
Ketones, ur: NEGATIVE mg/dL
Nitrite: NEGATIVE
Protein, ur: >=300 mg/dL — AB
RBC / HPF: >50 RBC/hpf (ref 0–5)
Specific Gravity, Urine: 1.018 (ref 1.005–1.030)
WBC, UA: >50 WBC/hpf (ref 0–5)
pH: 9 — ABNORMAL HIGH (ref 5.0–8.0)

## 2023-05-07 LAB — PROTIME-INR
INR: 1 (ref 0.8–1.2)
Prothrombin Time: 13.2 s (ref 11.4–15.2)

## 2023-05-07 LAB — COMPREHENSIVE METABOLIC PANEL
ALT: 94 U/L — ABNORMAL HIGH (ref 0–44)
AST: 68 U/L — ABNORMAL HIGH (ref 15–41)
Albumin: 1.8 g/dL — ABNORMAL LOW (ref 3.5–5.0)
Alkaline Phosphatase: 117 U/L (ref 38–126)
Anion gap: 8 (ref 5–15)
BUN: 44 mg/dL — ABNORMAL HIGH (ref 8–23)
CO2: 28 mmol/L (ref 22–32)
Calcium: 7.8 mg/dL — ABNORMAL LOW (ref 8.9–10.3)
Chloride: 92 mmol/L — ABNORMAL LOW (ref 98–111)
Creatinine, Ser: 0.76 mg/dL (ref 0.61–1.24)
GFR, Estimated: >60 mL/min (ref >60)
Glucose, Bld: 170 mg/dL — ABNORMAL HIGH (ref 70–99)
Potassium: 4.2 mmol/L (ref 3.5–5.1)
Sodium: 128 mmol/L — ABNORMAL LOW (ref 135–145)
Total Bilirubin: 0.3 mg/dL (ref <1.2)
Total Protein: 5.7 g/dL — ABNORMAL LOW (ref 6.5–8.1)

## 2023-05-07 LAB — LACTIC ACID, PLASMA
Lactic Acid, Venous: 1.9 mmol/L (ref 0.5–1.9)
Lactic Acid, Venous: 2.1 mmol/L (ref 0.5–1.9)

## 2023-05-07 MED ORDER — SODIUM CHLORIDE 0.9 % IV SOLN
2.0000 g | Freq: Once | INTRAVENOUS | Status: AC
  Administered 2023-05-07: 2 g via INTRAVENOUS
  Filled 2023-05-07: qty 12.5

## 2023-05-07 MED ORDER — LACTATED RINGERS IV BOLUS
1000.0000 mL | Freq: Once | INTRAVENOUS | Status: AC
  Administered 2023-05-07: 1000 mL via INTRAVENOUS

## 2023-05-07 MED ORDER — IOHEXOL 300 MG/ML  SOLN
100.0000 mL | Freq: Once | INTRAMUSCULAR | Status: AC | PRN
  Administered 2023-05-07: 100 mL via INTRAVENOUS

## 2023-05-07 MED ORDER — VANCOMYCIN HCL IN DEXTROSE 1-5 GM/200ML-% IV SOLN
1000.0000 mg | Freq: Two times a day (BID) | INTRAVENOUS | Status: DC
  Administered 2023-05-08: 1000 mg via INTRAVENOUS
  Filled 2023-05-07: qty 200

## 2023-05-07 MED ORDER — VANCOMYCIN HCL 1750 MG/350ML IV SOLN
1750.0000 mg | Freq: Once | INTRAVENOUS | Status: AC
  Administered 2023-05-07: 1750 mg via INTRAVENOUS
  Filled 2023-05-07: qty 350

## 2023-05-07 NOTE — ED Triage Notes (Addendum)
Pt BIB [facility] from [facility] for AMS & SOB.  EMS reports pt on RA saturation of 82%, with 2L [place] 86%, 4L [place] 97%. BP 99/78 HR 99 Temp 97.8 oral EMS reports facility stated pt was supposed to be started on antibiotic this evening for bed sore on his bottom.   Pt alert to person & situation.

## 2023-05-07 NOTE — Progress Notes (Signed)
Pharmacy Antibiotic Note  Eddie Reed. is a 72 y.o. male admitted on 05/07/2023 with sepsis.  Patient was brought in from [facility] with AMS and SOB. Per EMS, patient was supposed to start an antibiotic tonight for a bed sore on his bottom. Pharmacy has been consulted for vancomycin dosing.  Tmax 99.9 BP 94/52 HR 105  WBC 20.6 Scr 0.76- likely baseline  UA: large leukocytes, many bacteria, WBC >50  CT a/p ordered CXR ordered   Plan: Give vancomycin 1750 mg IV x1 followed by 1000 mg IV Q12H. Goal AUC 400-550. Expected AUC: 481.8 Expected Css min: 13.4 SCr used: 0.8  Weight used: IBW, Vd used: 0.72 (BMI 24.35) Continue to monitor renal function and follow culture results   Height: 5\' 10"  (177.8 cm) Weight: 77 kg (169 lb 11.2 oz) IBW/kg (Calculated) : 73  Temp (24hrs), Avg:99.9 F (37.7 C), Min:99.9 F (37.7 C), Max:99.9 F (37.7 C)  No results for input(s): "WBC", "CREATININE", "LATICACIDVEN", "VANCOTROUGH", "VANCOPEAK", "VANCORANDOM", "GENTTROUGH", "GENTPEAK", "GENTRANDOM", "TOBRATROUGH", "TOBRAPEAK", "TOBRARND", "AMIKACINPEAK", "AMIKACINTROU", "AMIKACIN" in the last 168 hours.  CrCl cannot be calculated (Patient's most recent lab result is older than the maximum 21 days allowed.).    Allergies  Allergen Reactions   Plavix [Clopidogrel] Hives   Levaquin [Levofloxacin] Hives   Cymbalta [Duloxetine Hcl]     Headache, shortness of breath   Paxil [Paroxetine] Hives   Terbinafine Hcl Hives    Lamisil     Antimicrobials this admission: 12/18 cefepime x 1  12/18 vancomycin >>   Dose adjustments this admission: None  Microbiology results: 12/18 BCx: IP 12/18 Ucx: IP  Thank you for allowing pharmacy to be a part of this patient's care.  Merryl Hacker, PharmD Clinical Pharmacist 05/07/2023 7:40 PM

## 2023-05-07 NOTE — ED Notes (Signed)
ED Provider at bedside. 

## 2023-05-08 ENCOUNTER — Encounter (HOSPITAL_COMMUNITY): Payer: Self-pay | Admitting: Internal Medicine

## 2023-05-08 ENCOUNTER — Other Ambulatory Visit: Payer: Self-pay

## 2023-05-08 DIAGNOSIS — Z515 Encounter for palliative care: Secondary | ICD-10-CM | POA: Diagnosis not present

## 2023-05-08 DIAGNOSIS — M869 Osteomyelitis, unspecified: Secondary | ICD-10-CM | POA: Diagnosis not present

## 2023-05-08 DIAGNOSIS — A419 Sepsis, unspecified organism: Secondary | ICD-10-CM | POA: Diagnosis not present

## 2023-05-08 DIAGNOSIS — N3090 Cystitis, unspecified without hematuria: Secondary | ICD-10-CM | POA: Diagnosis not present

## 2023-05-08 DIAGNOSIS — G9341 Metabolic encephalopathy: Secondary | ICD-10-CM

## 2023-05-08 DIAGNOSIS — E43 Unspecified severe protein-calorie malnutrition: Secondary | ICD-10-CM | POA: Insufficient documentation

## 2023-05-08 DIAGNOSIS — M8668 Other chronic osteomyelitis, other site: Secondary | ICD-10-CM | POA: Diagnosis not present

## 2023-05-08 DIAGNOSIS — E8809 Other disorders of plasma-protein metabolism, not elsewhere classified: Secondary | ICD-10-CM

## 2023-05-08 LAB — BLOOD CULTURE ID PANEL (REFLEXED) - BCID2

## 2023-05-08 LAB — CBC
HCT: 26 % — ABNORMAL LOW (ref 39.0–52.0)
Hemoglobin: 8.3 g/dL — ABNORMAL LOW (ref 13.0–17.0)
MCH: 26.8 pg (ref 26.0–34.0)
MCHC: 31.9 g/dL (ref 30.0–36.0)
MCV: 83.9 fL (ref 80.0–100.0)
Platelets: 390 10*3/uL (ref 150–400)
RBC: 3.1 MIL/uL — ABNORMAL LOW (ref 4.22–5.81)
RDW: 15.9 % — ABNORMAL HIGH (ref 11.5–15.5)
WBC: 30.5 10*3/uL — ABNORMAL HIGH (ref 4.0–10.5)
nRBC: 0 % (ref 0.0–0.2)

## 2023-05-08 LAB — COMPREHENSIVE METABOLIC PANEL
ALT: 67 U/L — ABNORMAL HIGH (ref 0–44)
AST: 50 U/L — ABNORMAL HIGH (ref 15–41)
Albumin: <1.5 g/dL — ABNORMAL LOW (ref 3.5–5.0)
Alkaline Phosphatase: 76 U/L (ref 38–126)
Anion gap: 8 (ref 5–15)
BUN: 43 mg/dL — ABNORMAL HIGH (ref 8–23)
CO2: 20 mmol/L — ABNORMAL LOW (ref 22–32)
Calcium: 6.6 mg/dL — ABNORMAL LOW (ref 8.9–10.3)
Chloride: 102 mmol/L (ref 98–111)
Creatinine, Ser: 0.78 mg/dL (ref 0.61–1.24)
GFR, Estimated: >60 mL/min (ref >60)
Glucose, Bld: 214 mg/dL — ABNORMAL HIGH (ref 70–99)
Potassium: 3.9 mmol/L (ref 3.5–5.1)
Sodium: 130 mmol/L — ABNORMAL LOW (ref 135–145)
Total Bilirubin: 0.4 mg/dL (ref <1.2)
Total Protein: 4.3 g/dL — ABNORMAL LOW (ref 6.5–8.1)

## 2023-05-08 LAB — GLUCOSE, CAPILLARY
Glucose-Capillary: 193 mg/dL — ABNORMAL HIGH (ref 70–99)
Glucose-Capillary: 262 mg/dL — ABNORMAL HIGH (ref 70–99)
Glucose-Capillary: 278 mg/dL — ABNORMAL HIGH (ref 70–99)

## 2023-05-08 LAB — MRSA NEXT GEN BY PCR, NASAL: MRSA by PCR Next Gen: NOT DETECTED

## 2023-05-08 LAB — PHOSPHORUS: Phosphorus: 3.8 mg/dL (ref 2.5–4.6)

## 2023-05-08 LAB — LACTIC ACID, PLASMA
Lactic Acid, Venous: 1 mmol/L (ref 0.5–1.9)
Lactic Acid, Venous: 0.9 mmol/L (ref 0.5–1.9)

## 2023-05-08 LAB — MAGNESIUM: Magnesium: 1.7 mg/dL (ref 1.7–2.4)

## 2023-05-08 LAB — CBG MONITORING, ED: Glucose-Capillary: 145 mg/dL — ABNORMAL HIGH (ref 70–99)

## 2023-05-08 MED ORDER — HALOPERIDOL 0.5 MG PO TABS: 0.5000 mg | ORAL_TABLET | ORAL | Status: DC | PRN

## 2023-05-08 MED ORDER — NOREPINEPHRINE 4 MG/250ML-% IV SOLN: 0.0000 ug/min | INTRAVENOUS | Status: DC

## 2023-05-08 MED ORDER — ALBUMIN HUMAN 25 % IV SOLN: 25.0000 g | INTRAVENOUS | Status: DC

## 2023-05-08 MED ORDER — LORAZEPAM 2 MG/ML IJ SOLN
1.0000 mg | INTRAMUSCULAR | Status: DC | PRN
  Administered 2023-05-08 – 2023-05-12 (×11): 1 mg via INTRAVENOUS
  Filled 2023-05-08 (×11): qty 1

## 2023-05-08 MED ORDER — JEVITY 1.5 CAL/FIBER PO LIQD
1000.0000 mL | ORAL | Status: DC
  Filled 2023-05-08 (×2): qty 1000

## 2023-05-08 MED ORDER — GLYCOPYRROLATE 0.2 MG/ML IJ SOLN: 0.2000 mg | INTRAMUSCULAR | Status: DC | PRN

## 2023-05-08 MED ORDER — FREE WATER
200.0000 mL | Status: DC
  Administered 2023-05-08: 200 mL

## 2023-05-08 MED ORDER — ACETAMINOPHEN 650 MG RE SUPP: 650.0000 mg | Freq: Four times a day (QID) | RECTAL | Status: DC | PRN

## 2023-05-08 MED ORDER — PROSOURCE TF20 ENFIT COMPATIBL EN LIQD
60.0000 mL | Freq: Two times a day (BID) | ENTERAL | Status: DC
  Administered 2023-05-08: 60 mL
  Filled 2023-05-08: qty 60

## 2023-05-08 MED ORDER — HALOPERIDOL LACTATE 5 MG/ML IJ SOLN
0.5000 mg | INTRAMUSCULAR | Status: DC | PRN
  Administered 2023-05-09 – 2023-05-12 (×4): 0.5 mg via INTRAVENOUS
  Filled 2023-05-08 (×4): qty 1

## 2023-05-08 MED ORDER — ONDANSETRON HCL 4 MG/2ML IJ SOLN
4.0000 mg | Freq: Four times a day (QID) | INTRAMUSCULAR | Status: DC | PRN
Start: 2023-05-08 — End: 2023-05-12

## 2023-05-08 MED ORDER — MIDODRINE HCL 5 MG PO TABS
10.0000 mg | ORAL_TABLET | ORAL | Status: AC
  Administered 2023-05-08: 10 mg
  Filled 2023-05-08: qty 2

## 2023-05-08 MED ORDER — SODIUM CHLORIDE 0.9 % IV BOLUS
1000.0000 mL | INTRAVENOUS | Status: AC
  Administered 2023-05-08: 1000 mL via INTRAVENOUS

## 2023-05-08 MED ORDER — ONDANSETRON 4 MG PO TBDP: 4.0000 mg | ORAL_TABLET | Freq: Four times a day (QID) | ORAL | Status: DC | PRN

## 2023-05-08 MED ORDER — ALBUMIN HUMAN 5 % IV SOLN
25.0000 g | INTRAVENOUS | Status: DC
  Administered 2023-05-08: 25 g via INTRAVENOUS
  Filled 2023-05-08: qty 500

## 2023-05-08 MED ORDER — POLYVINYL ALCOHOL 1.4 % OP SOLN: 1.0000 [drp] | Freq: Four times a day (QID) | OPHTHALMIC | Status: DC | PRN

## 2023-05-08 MED ORDER — JUVEN PO PACK
1.0000 | PACK | Freq: Two times a day (BID) | ORAL | Status: DC
  Administered 2023-05-08: 1
  Filled 2023-05-08: qty 1

## 2023-05-08 MED ORDER — SODIUM CHLORIDE 0.9 % IV SOLN
250.0000 mL | INTRAVENOUS | Status: DC
  Administered 2023-05-08: 250 mL via INTRAVENOUS

## 2023-05-08 MED ORDER — ENOXAPARIN SODIUM 40 MG/0.4ML IJ SOSY
40.0000 mg | PREFILLED_SYRINGE | INTRAMUSCULAR | Status: DC
  Administered 2023-05-08: 40 mg via SUBCUTANEOUS
  Filled 2023-05-08: qty 0.4

## 2023-05-08 MED ORDER — NOREPINEPHRINE 4 MG/250ML-% IV SOLN
INTRAVENOUS | Status: AC
  Administered 2023-05-08: 2 ug/min via INTRAVENOUS
  Filled 2023-05-08: qty 250

## 2023-05-08 MED ORDER — SODIUM CHLORIDE 0.9 % IV SOLN
2.0000 g | Freq: Three times a day (TID) | INTRAVENOUS | Status: DC
  Administered 2023-05-08 (×2): 2 g via INTRAVENOUS
  Filled 2023-05-08 (×2): qty 12.5

## 2023-05-08 MED ORDER — SODIUM CHLORIDE 0.9 % IV SOLN: INTRAVENOUS | Status: DC

## 2023-05-08 MED ORDER — METRONIDAZOLE 500 MG/100ML IV SOLN
500.0000 mg | Freq: Two times a day (BID) | INTRAVENOUS | Status: DC
  Administered 2023-05-08 (×2): 500 mg via INTRAVENOUS
  Filled 2023-05-08 (×2): qty 100

## 2023-05-08 MED ORDER — NOREPINEPHRINE 4 MG/250ML-% IV SOLN
2.0000 ug/min | INTRAVENOUS | Status: DC
  Administered 2023-05-08: 15 ug/min via INTRAVENOUS
  Filled 2023-05-08: qty 250

## 2023-05-08 MED ORDER — MIDODRINE HCL 5 MG PO TABS
10.0000 mg | ORAL_TABLET | ORAL | Status: AC
  Administered 2023-05-08: 10 mg

## 2023-05-08 MED ORDER — HYDROCORTISONE SOD SUC (PF) 100 MG IJ SOLR
100.0000 mg | INTRAMUSCULAR | Status: AC
Start: 2023-05-08 — End: 2023-05-08
  Administered 2023-05-08: 100 mg via INTRAVENOUS
  Filled 2023-05-08: qty 2

## 2023-05-08 MED ORDER — CHLORHEXIDINE GLUCONATE CLOTH 2 % EX PADS: 6.0000 | MEDICATED_PAD | Freq: Every day | CUTANEOUS | Status: DC

## 2023-05-08 MED ORDER — PROCHLORPERAZINE EDISYLATE 10 MG/2ML IJ SOLN: 5.0000 mg | Freq: Four times a day (QID) | INTRAMUSCULAR | Status: DC | PRN

## 2023-05-08 MED ORDER — MORPHINE SULFATE (PF) 2 MG/ML IV SOLN
2.0000 mg | INTRAVENOUS | Status: DC
  Administered 2023-05-08 – 2023-05-12 (×22): 2 mg via INTRAVENOUS
  Filled 2023-05-08 (×24): qty 1

## 2023-05-08 MED ORDER — VASOPRESSIN 20 UNITS/100 ML INFUSION FOR SHOCK
0.0000 [IU]/min | INTRAVENOUS | Status: DC
  Administered 2023-05-08: 0.03 [IU]/min via INTRAVENOUS
  Filled 2023-05-08: qty 100

## 2023-05-08 MED ORDER — HALOPERIDOL LACTATE 2 MG/ML PO CONC: 0.5000 mg | ORAL | Status: DC | PRN

## 2023-05-08 MED ORDER — INSULIN ASPART 100 UNIT/ML IJ SOLN
0.0000 [IU] | INTRAMUSCULAR | Status: DC
  Administered 2023-05-08: 5 [IU] via SUBCUTANEOUS
  Administered 2023-05-08: 1 [IU] via SUBCUTANEOUS
  Administered 2023-05-08: 5 [IU] via SUBCUTANEOUS
  Administered 2023-05-08: 2 [IU] via SUBCUTANEOUS

## 2023-05-08 MED ORDER — SODIUM CHLORIDE 0.9 % IV BOLUS
500.0000 mL | INTRAVENOUS | Status: AC
  Administered 2023-05-08: 500 mL via INTRAVENOUS

## 2023-05-08 MED ORDER — GLYCOPYRROLATE 1 MG PO TABS: 1.0000 mg | ORAL_TABLET | ORAL | Status: DC | PRN

## 2023-05-08 MED ORDER — ACETAMINOPHEN 325 MG PO TABS: 650.0000 mg | ORAL_TABLET | Freq: Four times a day (QID) | ORAL | Status: DC | PRN

## 2023-05-08 MED ORDER — HYDROCORTISONE SOD SUC (PF) 100 MG IJ SOLR
100.0000 mg | Freq: Three times a day (TID) | INTRAMUSCULAR | Status: DC
  Administered 2023-05-08: 100 mg via INTRAVENOUS
  Filled 2023-05-08: qty 2

## 2023-05-08 MED ORDER — MORPHINE SULFATE (PF) 2 MG/ML IV SOLN
2.0000 mg | INTRAVENOUS | Status: DC | PRN
  Administered 2023-05-08 – 2023-05-11 (×15): 2 mg via INTRAVENOUS
  Filled 2023-05-08 (×13): qty 1

## 2023-05-08 NOTE — Progress Notes (Signed)
   05/08/23 1640  Spiritual Encounters  Type of Visit Follow up  Care provided to: Pt and family  Conversation partners present during encounter Nurse   Chaplain returned to room to check back in with family and Pt.  In all, Pt spent about 90 minutes fully awake and talking with his family, something his daughter said he hasn't done in a long time.  Pt daughter Jacki Cones, expressed that her greatest joy of this time was that he understands he is dying and he is at peace.  She feels tremendous relief.  Chaplain's voice awakened the Pt and he recognized me and spoke briefly before falling back to sleep.  Pt family continues to be at bedside.  They have been instructed how to reach out for chaplain if needed.

## 2023-05-08 NOTE — H&P (Addendum)
History and Physical  Eddie Reed. ZOX:[id number] DOB: July 20, 1950 DOA: 05/07/2023  Referring physician: Dr. Posey Rea, EDP  PCP: Eloisa Northern, MD  Outpatient Specialists: Orthopedic surgery, rheumatology. Patient coming from: SNF.  Chief Complaint: Altered mental status and shortness of breath.  HPI: Eddie Reed. is a 72 y.o. male with medical history significant for prior CVA, chronic sacral decubitus ulcer, hypertension, type 2 diabetes, hyperlipidemia, diabetic polyneuropathy, chronic anxiety/depression, BPH, who presents to the ED from SNF due to altered mental status and shortness of breath today.  Associated with worsening sacral decubitus ulcer.  EMS was activated.  Upon EMS arrival, the patient was saturating 82% on room air, improved on 4 L nasal cannula.  Reportedly the patient was going to start oral antibiotics today for sacral decubitus wound infection.  He was brought into the ED for further evaluation.  In the ED, somnolent but arousable to voices.  Minimally interactive.  Hypotensive despite IV fluid boluses and 20 mg of oral vasopressor, midodrine.  UA positive for pyuria.  Due to concern for sepsis secondary to sacral decubitus ulcer and presumptive UTI, peripheral blood cultures x 2 and urine culture were obtained.  Empiric IV antibiotics were initiated.    Low blood pressures refractory to IV fluid boluses, IV albumin, and midodrine.  The patient was started on vasopressor, Levophed, requiring ICU level of care.  Admitted by [facility], hospitalist service.  ED Course: Temperature 98.6.  BP 83/54, pulse 107, respiratory 21, O2 saturation 95% on 4 L nasal cannula.  Review of Systems: Review of systems as noted in the HPI. All other systems reviewed and are negative.   Past Medical History:  Diagnosis Date   Allergy history, drug    plavix   Anxiety    Arm pain    continued discomfort in his arm- seeing Dr. Cliffton Asters   Blind right eye    CAD (coronary artery disease)     catheterizzation. 11/10/07. patent LIMA to the LAD with competitive filling, patent SVG to diagonal that enters a bifurcation point, occluded SVG to the distal marginal w/some collateralization, patent SVG to distal posterior lateral. medical therapy recommended.  EF 55% echo. June,2009. hypokinesis of the mid and distal septal wall. technically difficult study.   Carotid artery disease (HCC)    Doppler, July, 2012, 0-39% bilateral, followup 2 years   Cellulitis    s/p right lower extremity cellulitis   Claudication (HCC)    Arterial Dopplers, July, 2012, normal with normal ABI   COPD (chronic obstructive pulmonary disease) (HCC)    Diabetes mellitus    type not specified   Drug intolerance    Patient is intolerant to both aspirin and Plavix.   Dyslipidemia    ED (erectile dysfunction)    Ejection fraction    EF 55%, echo, June, 2009, hypokinesis mid/distal septal wall, technically difficult study   Fibromyalgia    GERD (gastroesophageal reflux disease)    Glaucoma    right eye   H/O alcohol abuse    Alcohol abuse in the past   Hypertension    Polyneuropathy in diabetes(357.2) 10/06/2013   Psoriatic arthritis (HCC)    Ringing in ear    June, 2012 /  carotid Dopplers, in July, 2012, 0-39% bilateral   S/P CABG x 1 09/18/2007   2009   Stroke due to embolism of cerebral artery (HCC) 03/08/2015   Right MCA   Tobacco abuse    Past Surgical History:  Procedure Laterality Date   CATARACT  EXTRACTION Right    CORONARY ARTERY BYPASS GRAFT     CYSTOSCOPY WITH INSERTION OF UROLIFT N/A 12/04/2020   Procedure: CYSTOSCOPY WITH INSERTION OF UROLIFT;  Surgeon: Malen Gauze, MD;  Location: [facility];  Service: Urology;  Laterality: N/A;   TOTAL KNEE ARTHROPLASTY Left 02/12/2023   Procedure: LEFT TOTAL KNEE ARTHROPLASTY;  Surgeon: [name], MD;  Location: [facility] OR;  Service: Orthopedics;  Laterality: Left;  Needs RNFA    Social History:  reports that he has been smoking cigarettes. He  started smoking about 61 years ago. He has a 31 pack-year smoking history. He has never used smokeless tobacco. He reports current alcohol use. He reports that he does not use drugs.   Allergies  Allergen Reactions   Plavix [Clopidogrel] Hives   Levaquin [Levofloxacin] Hives   Cymbalta [Duloxetine Hcl]     Headache, shortness of breath   Synthroid [Levothyroxine] Other (See Comments)    Listed on MAR from facility   Paxil [Paroxetine] Hives   Terbinafine Hcl Hives    Lamisil     Family History  Problem Relation Age of Onset   Dementia Mother    Diabetes Mother    Coronary artery disease Father    Heart attack Father    Cancer Brother    Coronary artery disease Other        family hx      Prior to Admission medications   Medication Sig Start Date End Date Taking? Authorizing Provider  ascorbic acid (VITAMIN C) 500 MG tablet Take 500 mg by mouth daily.   Yes [provider]  aspirin EC 81 MG tablet 81 mg daily. Via G-tube   Yes [provider]  atorvastatin (LIPITOR) 80 MG tablet Take 80 mg by mouth every evening. 03/04/23  Yes [provider]  carvedilol (COREG) 3.125 MG tablet Take 3.125 mg by mouth 2 (two) times daily with a meal.   Yes [provider]  doxazosin (CARDURA) 4 MG tablet Place 4 mg into feeding tube daily.   Yes [provider]  enoxaparin (LOVENOX) 40 MG/0.4ML injection Inject 40 mg into the skin daily.   Yes [provider]  gabapentin (NEURONTIN) 400 MG capsule Take 800 mg by mouth 3 (three) times daily. 03/04/23  Yes [provider]  insulin aspart (NOVOLOG) 100 UNIT/ML injection Inject 0-10 Units into the skin 3 (three) times daily before meals. 0-59 = 0 units 60-200 = 2 units 201-250 = 4 units 251-300 = 6 units 301-350 = 8 units 351-400 = 10 units 401-500 =  0 units (call MD)   Yes [provider]  insulin glargine (LANTUS) 100 UNIT/ML Solostar Pen Inject 6 Units into the skin  every evening.   Yes [provider]  lansoprazole (PREVACID) 30 MG capsule Take 30 mg by mouth daily at 12 noon.   Yes [provider]  LORazepam (ATIVAN) 0.5 MG tablet Take 0.5 mg by mouth 2 (two) times daily.   Yes [provider]  metoprolol succinate (TOPROL-XL) 25 MG 24 hr tablet TAKE 1/2 TABLET BY MOUTH EVERY DAY (pt needs APPOINTMENT FOR more refills) Patient taking differently: 12.5 mg in the morning and at bedtime. Via feeding tube 12/04/22  Yes Branch, Dorothe Pea, MD  Oxycodone HCl 10 MG TABS Place 1 tablet into feeding tube in the morning, at noon, and at bedtime.   Yes [provider]  polyethylene glycol (MIRALAX / GLYCOLAX) 17 g packet Place 17 g into feeding  tube daily.   Yes [provider]  risperiDONE (RISPERDAL) 0.5 MG tablet Take 0.5 mg by mouth in the morning, at noon, and at bedtime.   Yes [provider]  sertraline (ZOLOFT) 25 MG tablet Place 25 mg into feeding tube daily.   Yes [provider]  Zinc Acetate, Oral, (ZINC ACETATE PO) Place 1 tablet into feeding tube daily.   Yes [provider]  oxycodone (OXY-IR) 5 MG capsule Place 5 mg into feeding tube once.    [provider]    Physical Exam: BP (!) 81/60   Pulse (!) 107   Temp 98.6 F (37 C) (Oral)   Resp 18   Ht 5\' 10"  (1.778 m)   Wt 77 kg   SpO2 97%   BMI 24.35 kg/m   General: 72 y.o. year-old male well developed well nourished in no acute distress.  Somnolent and minimally interactive. Cardiovascular: Tachycardic with no rubs or gallops.  No thyromegaly or JVD noted.  Trace lower extremity edema. Respiratory: Mild rales at bases.  Poor inspiratory effort. Abdomen: Soft nontender nondistended with normal bowel sounds x4 quadrants.  Colostomy bag.  PEG tube in place. Muskuloskeletal: No cyanosis, clubbing or edema noted bilaterally Neuro: CN II-XII intact, strength, sensation, reflexes Skin: No ulcerative lesions noted or  rashes Psychiatry: Judgement and insight appear normal. Mood is appropriate for condition and setting          Labs on Admission:  Basic Metabolic Panel: Recent Labs  Lab 05/07/23 1940  NA 128*  K 4.2  CL 92*  CO2 28  GLUCOSE 170*  BUN 44*  CREATININE 0.76  CALCIUM 7.8*   Liver Function Tests: Recent Labs  Lab 05/07/23 1940  AST 68*  ALT 94*  ALKPHOS 117  BILITOT 0.3  PROT 5.7*  ALBUMIN 1.8*   No results for input(s): "LIPASE", "AMYLASE" in the last 168 hours. No results for input(s): "AMMONIA" in the last 168 hours. CBC: Recent Labs  Lab 05/07/23 1940  WBC 20.6*  NEUTROABS 15.9*  HGB 9.9*  HCT 31.5*  MCV 84.2  PLT 443*   Cardiac Enzymes: No results for input(s): "CKTOTAL", "CKMB", "CKMBINDEX", "TROPONINI" in the last 168 hours.  BNP (last 3 results) No results for input(s): "BNP" in the last 8760 hours.  ProBNP (last 3 results) No results for input(s): "PROBNP" in the last 8760 hours.  CBG: Recent Labs  Lab 05/08/23 0110  GLUCAP 145*    Radiological Exams on Admission: CT CHEST ABDOMEN PELVIS W CONTRAST Result Date: 05/07/2023 CLINICAL DATA:  Sepsis, history of bedsore. EXAM: CT CHEST, ABDOMEN, AND PELVIS WITH CONTRAST TECHNIQUE: Multidetector CT imaging of the chest, abdomen and pelvis was performed following the standard protocol during bolus administration of intravenous contrast. RADIATION DOSE REDUCTION: This exam was performed according to the departmental dose-optimization program which includes automated exposure control, adjustment of the mA and/or kV according to patient size and/or use of iterative reconstruction technique. CONTRAST:  OMNIPAQUE IOHEXOL 300 MG/ML  SOLN COMPARISON:  02/25/2023. FINDINGS: CT CHEST FINDINGS Cardiovascular: The heart is normal in size and there is no pericardial effusion. Multi-vessel coronary artery calcifications are noted. There is atherosclerotic calcification of the aorta with aneurysmal dilatation of  the ascending aorta measuring 4.1 cm. Pulmonary trunk is normal in caliber. Mediastinum/Nodes: No enlarged mediastinal, hilar, or axillary lymph nodes. Thyroid gland, trachea, and esophagus demonstrate no significant findings. Lungs/Pleura: Advanced paraseptal and centrilobular emphysematous changes are present in the lungs. Bronchiectasis is noted in the  lower lobes bilaterally with a associated atelectasis or infiltrate. There is a 7 mm nodule in the left lower lobe, axial image 91. There is a 5 mm nodule in the left lower lobe, axial image 86. No effusion or pneumothorax is seen. Musculoskeletal: Sternotomy wires are noted. Degenerative changes are present in the thoracic spine. An old healed rib fracture is noted on the left. No acute osseous abnormality is seen. CT ABDOMEN PELVIS FINDINGS Hepatobiliary: No focal liver abnormality is seen. Fatty infiltration of the liver is noted. Stones are present within the gallbladder. No biliary ductal dilatation is seen. Pancreas: Unremarkable. No pancreatic ductal dilatation or surrounding inflammatory changes. Spleen: Normal in size without focal abnormality. Adrenals/Urinary Tract: The right adrenal gland is within normal limits. A 1.2 cm nodule is noted in the left adrenal gland, previously characterized as adenoma. The kidneys enhance symmetrically. No renal calculus or hydronephrosis bilaterally. There is diffuse bladder wall thickening with perivesicular fat stranding. A Foley catheter is in place. Stomach/Bowel: A PEG tube terminates in the gastric body. Appendix appears normal. No bowel obstruction, free air or pneumatosis is seen. Left lower quadrant colostomy is noted. A few scattered diverticula are noted along the colon without evidence of diverticulitis. There is bowel wall thickening involving the colon at the splenic flexure with mild associated fat stranding. Vascular/Lymphatic: Aortic atherosclerosis. Nonspecific prominent lymph nodes are noted in the  periaortic space on the left. Reproductive: Prostate gland is enlarged with multiple brachytherapy seeds. Other: A small amount of perisplenic free fluid is noted extending into the anterior pararenal space on the left. Subcutaneous air is noted in the in low anterior abdominal wall on the right which may be iatrogenic. Musculoskeletal: There is a large sacral decubitus ulcer in the midline posterior to the distal sacrum and coccyx with a associated bony erosions which have progressed from the previous exam. No abscess is seen. Degenerative changes are present in the lumbar spine. There is bilateral spondylolysis at L5 with grade 1 anterolisthesis at L5-S1. IMPRESSION: 1. Interval enlargement of sacral decubitus ulcer with associated erosions involving the distal sacrum and coccyx, concerning for osteomyelitis. No abscess is seen. 2. Diffuse bladder wall thickening with surrounding fat stranding, suggesting infectious or inflammatory cystitis. 3. Mild colonic wall thickening at the splenic flexure with surrounding fat stranding and a small amount of free fluid in the left upper quadrant, possible focal colitis. 4. Bilateral lower lobe bronchiectasis with associated atelectasis or infiltrate. 5. Left lower lobe pulmonary nodules measuring up to 7 mm. Non-contrast chest CT at 6-12 months is recommended. If the nodule is stable at time of repeat CT, then future CT at 18-24 months (from today's scan) is considered optional for low-risk patients, but is recommended for high-risk patients. This recommendation follows the consensus statement: Guidelines for Management of Incidental Pulmonary Nodules Detected on CT Images: From the Fleischner Society 2017; Radiology 2017; 284:228-243. 6. Emphysema. 7. Aortic atherosclerosis with aneurysmal dilatation of the ascending aorta measuring 4.1 cm. Recommend annual imaging followup by CTA or MRA. This recommendation follows 2010 ACCF/AHA/AATS/ACR/ASA/SCA/SCAI/SIR/STS/SVM Guidelines  for the Diagnosis and Management of Patients with Thoracic Aortic Disease. Circulation. 2010; 121: Z610-R604. Aortic aneurysm NOS (ICD10-I71.9) 8. Cholelithiasis. 9. Remaining ancillary findings as described above. Electronically Signed   By: Thornell Sartorius M.D.   On: 05/07/2023 22:41   DG Chest Port 1 View Result Date: 05/07/2023 CLINICAL DATA:  Altered mental status and shortness of breath EXAM: PORTABLE CHEST 1 VIEW COMPARISON:  02/25/2023 FINDINGS: Sternotomy and CABG. Stable  cardiomediastinal silhouette. Aortic atherosclerotic calcification. Low lung volumes. Chronic bronchitic changes. Interstitial coarsening in the left lower lung has increased compared to 02/25/2023. No pleural effusion or pneumothorax. IMPRESSION: Increased interstitial coarsening in the left lower lung which may be due to atelectasis or infection. Electronically Signed   By: Minerva Fester M.D.   On: 05/07/2023 21:20    EKG: I independently viewed the EKG done and my findings are as followed: None available at the time of this visit.  Assessment/Plan Present on Admission:  Osteomyelitis (HCC)  Principal Problem:   Osteomyelitis (HCC) Sepsis with shock secondary to sacral decubitus osteomyelitis, seen on CT scan, and presumptive UTI, POA Follow peripheral blood cultures x 2 Continue empiric IV antibiotics, IV vancomycin, IV cefepime, IV Flagyl. Monitor fever curve and WBCs Maintain MAP greater than 65 Trend lactic acid IV fluid hydration NS at 100 cc/h x 12 hours. Vasopressor, Levophed to maintain MAP above 65  Acute metabolic encephalopathy in the setting of sepsis with shock Continue to treat underlying conditions Reorient as needed Fall and aspiration precautions.  Lactic acidosis from sepsis and hypovolemia Lactic acid 2.1 Trend lactic acid level, continue IV fluid hydration and IV antibiotics.  Hypovolemic hyponatremia Serum sodium 128 IV fluid hydration NS at 100 cc/h x 12 hours. Repeat chemistry  panel in the morning  Acute hypoxic respiratory failure, suspect secondary to left lower lung bacterial pneumonia Increased interstitial coarsening in the left lower lung which may be due to atelectasis or infection. Continue empiric IV antibiotics Wean off oxygen supplementation as tolerated Currently O2 saturation 95% on 4 L  Hypoalbuminemia Serum albumin 1.8 IV albumin 25% 25 g x 1  Type 2 diabetes with hyperglycemia Obtain hemoglobin A1c Insulin sliding scale every 4 hours while NPO.  Diabetic polyneuropathy Hold off gabapentin due to altered mental status Reorient as needed Closely monitor  Hypertension, currently hypotensive Hold off home p.o. antihypertensives Maintain MAP greater than 65 Closely monitor vital signs  GERD Resume home Prevacid  Chronic anxiety/depression Resume home regimen  Hyperlipidemia Resume home Lipitor  BPH Resume home tamsulosin Monitor urine output  History of dysphagia status post PEG tube placement Home p.o. meds administered through PEG tube Continue aspiration precautions Dietitian consulted  Status post colostomy bag Continue colostomy care  Goals of care Palliative care medicine consulted to assist with establishing goals of care. Currently DO NOT INTUBATE. Continue to treat the treatable.  Chronic indwelling Foley catheter due to chronic urinary retention and chronic sacral decubitus wound, POA Presented from SNF with indwelling Foley catheter already inserted UA positive for pyuria Exchange Foley catheter on 05/08/2023    Critical care time: 65 minutes.    DVT prophylaxis: Subcu Lovenox daily  Code Status: DNR-DO NOT INTUBATE per the patient's son and daughter via phone.  Family Communication: Updated the patient's son at bedside and the patient's daughter via phone.  Disposition Plan: Admitted to ICU  Consults called: Palliative care medicine, dietitian.  Admission status: Inpatient status.   Status  is: Inpatient The patient requires at least 2 midnights for further evaluation and treatment of present condition.   Darlin Drop MD [facility] Pager [phone]  If 7PM-7AM, please contact night-coverage [email or web address] Password [email or web address]  05/08/2023, 1:21 AM

## 2023-05-08 NOTE — Consult Note (Signed)
Consultation Note Date: 05/08/2023   Patient Name: Eddie Reed.  DOB: [birth year]  MRN: [id number]  Age / Sex: 72 y.o., male  PCP: Eloisa Northern, MD Referring Physician: Vassie Loll, MD  Reason for Consultation:  goals of care  HPI/Patient Profile: 72 y.o. male  with past medical history of CVA, chronic sacral wound s/p multiple debridements, wound vac, HTN, DM2 with polyneuropathy admitted on 05/07/2023 with sepsis due to UTI, sacral wound. Requiring vasopressin and norepinephrine. CT scan indicates increase in size of sacral wound and concern for osteomyelitis in addition to inflammatory cystitis and colitis. Albumin less than 1.5 on admission. He has a colostomy in place. Palliative consulted for GOC.    Primary Decision Maker NEXT OF KIN daughter Eddie Reed and son Eddie Reed.  Discussion: I have reviewed medical records including Care Everywhere, progress notes from this and prior admissions, labs and imaging, discussed with RN.  On evaluation patient is awake. Does not make eye contact. He appears frail and cachetic. He responds "yes" to pain, states is in his low back. Does not respond verbally to any other questions.   Met with his daughter, Eddie Reed in family room. She notes he said "I love you" and squeezed her hand, but no other response to her as well.   We discussed a brief life review of the patient. 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 He has been on overall decline since most recent knee surgery- has been hospitalized or in nursing facility since October. He found joy in his music.  Nothing brings him joy any longer.   We discussed patient's current illness and what it means in the larger context of patient's on-going co-morbidities.  Natural disease trajectory and expectations at EOL were discussed.  I attempted to elicit values and goals of care  important to the patient.    The difference between aggressive medical intervention and comfort care was considered in light of the patient's goals of care.   Discussed transition to comfort measures only which includes stopping IV fluids, antibiotics, labs and providing symptom management for SOB, anxiety, nausea, vomiting, and other symptoms of dying.   After discussion, patient's daughter called her brother so he could be involved with decision making. Final decision made to transition patient to full comfort measures only.    SUMMARY OF RECOMMENDATIONS -Sepsis r/t UTI, chronic sacral wound with osteomyelitis- pt with cachexia, hypoalbuminemia, elevated lactic acid- after thorough discussion with family decision made to transition to comfort measures only as he is likely to only continue to become septic with repeat hospitalizations, wound is not likely to heal- this is not quality of life that patient would choose for himself.  -Start mophine 2mg  q4hrs IV for pain, also 2mg  IV q30 min as needed for pain or dyspnea -Wean pressors- reduce bu 50% for 10 mins then turn off -Stop IV fluids, medications, and interventions that are not providing comfort -Anticipate pt will decline quickly and experience hospital death, however  if, after he transitions to comfort, it appears that he will be stable for transfer then recommend discussing transfer to hospice facility      Code Status/Advance Care Planning: DNR   Prognosis:   Hours - Days  Discharge Planning: Anticipated Hospital Death  Primary Diagnoses: Present on Admission:  Osteomyelitis (HCC)   Review of Systems  Unable to perform ROS: Acuity of condition    Physical Exam Vitals and nursing note reviewed.  Constitutional:      Appearance: He is cachectic. He is ill-appearing and toxic-appearing.  Cardiovascular:     Rate and Rhythm: Normal rate.  Pulmonary:     Effort: Pulmonary effort is normal.  Neurological:     Comments:  Minimal verbal responses     Vital Signs: BP 128/64   Pulse 68   Temp 98 F (36.7 C)   Resp 12   Ht 5\' 10"  (1.778 m)   Wt 76.4 kg   SpO2 100%   BMI 24.17 kg/m  Pain Scale: 0-10   Pain Score: Asleep   SpO2: SpO2: 100 % O2 Device:SpO2: 100 % O2 Flow Rate: .O2 Flow Rate (L/min): 4 L/min  IO: Intake/output summary:  Intake/Output Summary (Last 24 hours) at 05/08/2023 1343 Last data filed at 05/08/2023 1034 Gross per 24 hour  Intake 3433.54 ml  Output --  Net 3433.54 ml    LBM: Last BM Date : 05/08/23 Baseline Weight: Weight: 77 kg Most recent weight: Weight: 76.4 kg       Thank you for this consult. Palliative medicine will continue to follow and assist as needed.  Time Total: 120 minutes Signed by: Ocie Bob, AGNP-C Palliative Medicine  Time includes:   Preparing to see the patient (e.g., review of tests) Obtaining and/or reviewing separately obtained history Performing a medically necessary appropriate examination and/or evaluation Counseling and educating the patient/family/caregiver Ordering medications, tests, or procedures Referring and communicating with other health care professionals (when not reported separately) Documenting clinical information in the electronic or other health record Independently interpreting results (not reported separately) and communicating results to the patient/family/caregiver Care coordination (not reported separately) Clinical documentation   Please contact Palliative Medicine Team phone at [phone] for questions and concerns.  For individual provider: See Loretha Stapler

## 2023-05-08 NOTE — Progress Notes (Signed)
   05/08/23 1501  Spiritual Encounters  Type of Visit Initial  Care provided to: Pt and family  Conversation partners present during encounter Nurse  Referral source Nurse (RN/NT/LPN)  Reason for visit End-of-life  OnCall Visit No   Reason for Visit: Chaplain responding to call from RN concern Pt being transitioned to full comfort care.  Time of Visit: 45 minutes  Description of Care: Chaplain arrived to find Pt deeply sleeping and daughter Jacki Cones) weeping at the bedside. Chaplain establish compassionate presence through empathetic body language and reflective listening.    Chaplain facilitated brief life review of Pt with daughter.  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  Pt was a good father and grandfather and loves his family deeply.  Rn entered room and morphine was administered according to comfort care plan.  Pt woke up and began talking to daughter, the chaplain, and the Rn.  Pt asked repeatedly if he was dying, daughter answered, "we can't tell you if you are or aren't"   Pt looked at chaplain and asked for prayer saying, "I can use all the prayers I can get."  Chaplain complied asking for comfort, peace, and freedom from pain.  Pt's son and ex-wife entered room and Pt asked son if he was passing away and son said, "we don;t think you have much time, daddy."  Pt began to weep and told his family he would miss them an that he loved them.  Explained to family that he was leaving to make a few more visits and give them some family time together while the Pt was rallying.  Chaplain promised to return later.  Plan of Care: Chaplain will continue to follow until Pt passes and will then support the family.

## 2023-05-08 NOTE — Progress Notes (Signed)
Patient seen and examined; admitted after midnight secondary to septic shock in the presence of chronic indwelling catheter UTI and also sacral decubitus osteomyelitis; patient was somnolent/lethargic and requiring Solu-Cortef, albumin and pressor supports.  Family coming to visit him; after discussion of how severe acute process it was they do not want to place central line or pursuit any heroic interventions.  Will continue to treat the treatable for now and follow response for potential transition into symptomatic management and comfort care.  As per patient chart review he has wished before for DNR/DNI.  Please refer to H&P written by Dr. Margo Aye for further info/details on admission.  Plan: -Continue current antibiotics and supportive care -Wean off of pressor support as tolerated -Palliative care consultation has been added -Will follow further discussions regarding goals of care and advance care planning with family. -Will hold on Central line placement at the moment.  Vassie Loll MD [phone]

## 2023-05-08 NOTE — ED Notes (Signed)
ED TO INPATIENT HANDOFF REPORT  ED Nurse Name and Phone #: Claris Gower [phone]  S Name/Age/Gender Eddie Santee Sr. 72 y.o. male Room/Bed: APA08/APA08  Code Status   Code Status: Limited: Do not attempt resuscitation (DNR) -DNR-LIMITED -Do Not Intubate/DNI   Home/SNF/Other Rehab Patient oriented to: self, place, and situation Is this baseline? Yes   Triage Complete: Triage complete  Chief Complaint Osteomyelitis [facility]) [M86.9]  Triage Note Pt BIB [facility] from [facility] for AMS & SOB.  EMS reports pt on RA saturation of 82%, with 2L Evans 86%, 4L  97%. BP 99/78 HR 99 Temp 97.8 oral EMS reports facility stated pt was supposed to be started on antibiotic this evening for bed sore on his bottom.   Pt alert to person & situation.    Allergies Allergies  Allergen Reactions   Plavix [Clopidogrel] Hives   Levaquin [Levofloxacin] Hives   Cymbalta [Duloxetine Hcl]     Headache, shortness of breath   Synthroid [Levothyroxine] Other (See Comments)    Listed on MAR from facility   Paxil [Paroxetine] Hives   Terbinafine Hcl Hives    Lamisil     Level of Care/Admitting Diagnosis ED Disposition     ED Disposition  Admit   Condition  --   Comment  Hospital Area: [facility] [[id number]]  Level of Care: Stepdown [14]  Covid Evaluation: Asymptomatic - no recent exposure (last 10 days) testing not required  Diagnosis: Osteomyelitis [facility]) [[id number]]  Admitting Physician: Darlin Drop [[id number]]  Attending Physician: Darlin Drop [[id number]]  Certification:: I certify this patient will need inpatient services for at least 2 midnights          B Medical/Surgery History Past Medical History:  Diagnosis Date   Allergy history, drug    plavix   Anxiety    Arm pain    continued discomfort in his arm- seeing Dr. Cliffton Asters   Blind right eye    CAD (coronary artery disease)    catheterizzation. 11/10/07. patent LIMA to the LAD with competitive filling, patent SVG to  diagonal that enters a bifurcation point, occluded SVG to the distal marginal w/some collateralization, patent SVG to distal posterior lateral. medical therapy recommended.  EF 55% echo. June,2009. hypokinesis of the mid and distal septal wall. technically difficult study.   Carotid artery disease (HCC)    Doppler, July, 2012, 0-39% bilateral, followup 2 years   Cellulitis    s/p right lower extremity cellulitis   Claudication (HCC)    Arterial Dopplers, July, 2012, normal with normal ABI   COPD (chronic obstructive pulmonary disease) (HCC)    Diabetes mellitus    type not specified   Drug intolerance    Patient is intolerant to both aspirin and Plavix.   Dyslipidemia    ED (erectile dysfunction)    Ejection fraction    EF 55%, echo, June, 2009, hypokinesis mid/distal septal wall, technically difficult study   Fibromyalgia    GERD (gastroesophageal reflux disease)    Glaucoma    right eye   H/O alcohol abuse    Alcohol abuse in the past   Hypertension    Polyneuropathy in diabetes(357.2) 10/06/2013   Psoriatic arthritis (HCC)    Ringing in ear    June, 2012 /  carotid Dopplers, in July, 2012, 0-39% bilateral   S/P CABG x 1 09/18/2007   2009   Stroke due to embolism of cerebral artery (HCC) 03/08/2015   Right MCA   Tobacco abuse  Past Surgical History:  Procedure Laterality Date   CATARACT EXTRACTION Right    CORONARY ARTERY BYPASS GRAFT     CYSTOSCOPY WITH INSERTION OF UROLIFT N/A 12/04/2020   Procedure: CYSTOSCOPY WITH INSERTION OF UROLIFT;  Surgeon: Malen Gauze, MD;  Location: [facility];  Service: Urology;  Laterality: N/A;   TOTAL KNEE ARTHROPLASTY Left 02/12/2023   Procedure: LEFT TOTAL KNEE ARTHROPLASTY;  Surgeon: [name], MD;  Location: [facility];  Service: Orthopedics;  Laterality: Left;  Needs RNFA     A IV Location/Drains/Wounds Patient Lines/Drains/Airways Status     Active Line/Drains/Airways     Name Placement date Placement time Site Days    Peripheral IV 05/07/23 20 G Right Antecubital 05/07/23  1940  Antecubital  1   Peripheral IV 05/07/23 20 G Left Antecubital 05/07/23  1948  Antecubital  1   Pressure Injury 02/18/23 Buttocks Medial Deep Tissue Pressure Injury - Purple or maroon localized area of discolored intact skin or blood-filled blister due to damage of underlying soft tissue from pressure and/or shear. 02/18/23  1054  -- 79            Intake/Output Last 24 hours  Intake/Output Summary (Last 24 hours) at 05/08/2023 0145 Last data filed at 05/08/2023 0113 Gross per 24 hour  Intake 1000 ml  Output --  Net 1000 ml    Labs/Imaging Results for orders placed or performed during the hospital encounter of 05/07/23 (from the past 48 hours)  Comprehensive metabolic panel     Status: Abnormal   Collection Time: 05/07/23  7:40 PM  Result Value Ref Range   Sodium 128 (L) 135 - 145 mmol/L   Potassium 4.2 3.5 - 5.1 mmol/L   Chloride 92 (L) 98 - 111 mmol/L   CO2 28 22 - 32 mmol/L   Glucose, Bld 170 (H) 70 - 99 mg/dL    Comment: Glucose reference range applies only to samples taken after fasting for at least 8 hours.   BUN 44 (H) 8 - 23 mg/dL   Creatinine, Ser 6.21 0.61 - 1.24 mg/dL   Calcium 7.8 (L) 8.9 - 10.3 mg/dL   Total Protein 5.7 (L) 6.5 - 8.1 g/dL   Albumin 1.8 (L) 3.5 - 5.0 g/dL   AST 68 (H) 15 - 41 U/L   ALT 94 (H) 0 - 44 U/L   Alkaline Phosphatase 117 38 - 126 U/L   Total Bilirubin 0.3 <1.2 mg/dL   GFR, Estimated >30 >86 mL/min    Comment: (NOTE) Calculated using the CKD-EPI Creatinine Equation (2021)    Anion gap 8 5 - 15    Comment: Performed at [facility], [address] [place], [place] [address]  Lactic acid, plasma     Status: None   Collection Time: 05/07/23  7:40 PM  Result Value Ref Range   Lactic Acid, Venous 1.9 0.5 - 1.9 mmol/L    Comment: Performed at [facility], [address], [place], [place] [address]  CBC with Differential     Status: Abnormal   Collection Time: 05/07/23  7:40  PM  Result Value Ref Range   WBC 20.6 (H) 4.0 - 10.5 K/uL   RBC 3.74 (L) 4.22 - 5.81 MIL/uL   Hemoglobin 9.9 (L) 13.0 - 17.0 g/dL   HCT 28.4 (L) 13.2 - 44.0 %   MCV 84.2 80.0 - 100.0 fL   MCH 26.5 26.0 - 34.0 pg   MCHC 31.4 30.0 - 36.0 g/dL   RDW 10.2 (H)  11.5 - 15.5 %   Platelets 443 (H) 150 - 400 K/uL   nRBC 0.0 0.0 - 0.2 %   Neutrophils Relative % 77 %   Neutro Abs 15.9 (H) 1.7 - 7.7 K/uL   Lymphocytes Relative 9 %   Lymphs Abs 1.9 0.7 - 4.0 K/uL   Monocytes Relative 13 %   Monocytes Absolute 2.6 (H) 0.1 - 1.0 K/uL   Eosinophils Relative 0 %   Eosinophils Absolute 0.1 0.0 - 0.5 K/uL   Basophils Relative 0 %   Basophils Absolute 0.0 0.0 - 0.1 K/uL   Immature Granulocytes 1 %   Abs Immature Granulocytes 0.13 (H) 0.00 - 0.07 K/uL    Comment: Performed at [facility], [address] [place], [place] [address]  Protime-INR     Status: None   Collection Time: 05/07/23  7:40 PM  Result Value Ref Range   Prothrombin Time 13.2 11.4 - 15.2 seconds   INR 1.0 0.8 - 1.2    Comment: (NOTE) INR goal varies based on device and disease states. Performed at [facility], [address], [place], [place] [address]   Culture, blood (Routine x 2)     Status: None (Preliminary result)   Collection Time: 05/07/23  7:40 PM   Specimen: Right Antecubital; Blood  Result Value Ref Range   Specimen Description RIGHT ANTECUBITAL    Special Requests      BOTTLES DRAWN AEROBIC AND ANAEROBIC Blood Culture adequate volume Performed at [facility], [address], [place], [place] [address]    Culture PENDING    Report Status PENDING   Urinalysis, w/ Reflex to Culture (Infection Suspected) -Urine, Clean Catch     Status: Abnormal   Collection Time: 05/07/23  7:40 PM  Result Value Ref Range   Specimen Source URINE, CLEAN CATCH     Comment: CORRECTED ON 12/18 AT 2003: PREVIOUSLY REPORTED AS URINE, CATHETERIZED   Color, Urine AMBER (A) YELLOW    Comment: BIOCHEMICALS MAY BE AFFECTED BY COLOR    APPearance CLOUDY (A) CLEAR   Specific Gravity, Urine 1.018 1.005 - 1.030   pH 9.0 (H) 5.0 - 8.0   Glucose, UA NEGATIVE NEGATIVE mg/dL   Hgb urine dipstick NEGATIVE NEGATIVE   Bilirubin Urine NEGATIVE NEGATIVE   Ketones, ur NEGATIVE NEGATIVE mg/dL   Protein, ur >=914 (A) NEGATIVE mg/dL   Nitrite NEGATIVE NEGATIVE   Leukocytes,Ua LARGE (A) NEGATIVE   RBC / HPF >50 0 - 5 RBC/hpf   WBC, UA >50 0 - 5 WBC/hpf    Comment:        Reflex urine culture not performed if WBC <=10, OR if Squamous epithelial cells >5. If Squamous epithelial cells >5 suggest recollection.    Bacteria, UA MANY (A) NONE SEEN   Squamous Epithelial / HPF 0-5 0 - 5 /HPF   Mucus PRESENT    Triple Phosphate Crystal PRESENT     Comment: Performed at [facility], [address], [place], [place] [address]  Culture, blood (Routine x 2)     Status: None (Preliminary result)   Collection Time: 05/07/23  7:47 PM   Specimen: Left Antecubital; Blood  Result Value Ref Range   Specimen Description LEFT ANTECUBITAL    Special Requests      BOTTLES DRAWN AEROBIC AND ANAEROBIC Blood Culture adequate volume Performed at [facility], [address], [place], [place] [address]    Culture PENDING    Report Status PENDING   Lactic acid, plasma  Status: Abnormal   Collection Time: 05/07/23  9:17 PM  Result Value Ref Range   Lactic Acid, Venous 2.1 (HH) 0.5 - 1.9 mmol/L    Comment: CRITICAL RESULT CALLED TO, READ BACK BY AND VERIFIED WITH K. BELTON AT 2158 ON 12.18.24 BY ADGER J Performed at [facility], [address], [place], [place] [address]   CBG monitoring, ED     Status: Abnormal   Collection Time: 05/08/23  1:10 AM  Result Value Ref Range   Glucose-Capillary 145 (H) 70 - 99 mg/dL    Comment: Glucose reference range applies only to samples taken after fasting for at least 8 hours.   CT CHEST ABDOMEN PELVIS W CONTRAST Result Date: 05/07/2023 CLINICAL DATA:  Sepsis, history of bedsore. EXAM: CT CHEST, ABDOMEN, AND  PELVIS WITH CONTRAST TECHNIQUE: Multidetector CT imaging of the chest, abdomen and pelvis was performed following the standard protocol during bolus administration of intravenous contrast. RADIATION DOSE REDUCTION: This exam was performed according to the departmental dose-optimization program which includes automated exposure control, adjustment of the mA and/or kV according to patient size and/or use of iterative reconstruction technique. CONTRAST:  OMNIPAQUE IOHEXOL 300 MG/ML  SOLN COMPARISON:  02/25/2023. FINDINGS: CT CHEST FINDINGS Cardiovascular: The heart is normal in size and there is no pericardial effusion. Multi-vessel coronary artery calcifications are noted. There is atherosclerotic calcification of the aorta with aneurysmal dilatation of the ascending aorta measuring 4.1 cm. Pulmonary trunk is normal in caliber. Mediastinum/Nodes: No enlarged mediastinal, hilar, or axillary lymph nodes. Thyroid gland, trachea, and esophagus demonstrate no significant findings. Lungs/Pleura: Advanced paraseptal and centrilobular emphysematous changes are present in the lungs. Bronchiectasis is noted in the lower lobes bilaterally with a associated atelectasis or infiltrate. There is a 7 mm nodule in the left lower lobe, axial image 91. There is a 5 mm nodule in the left lower lobe, axial image 86. No effusion or pneumothorax is seen. Musculoskeletal: Sternotomy wires are noted. Degenerative changes are present in the thoracic spine. An old healed rib fracture is noted on the left. No acute osseous abnormality is seen. CT ABDOMEN PELVIS FINDINGS Hepatobiliary: No focal liver abnormality is seen. Fatty infiltration of the liver is noted. Stones are present within the gallbladder. No biliary ductal dilatation is seen. Pancreas: Unremarkable. No pancreatic ductal dilatation or surrounding inflammatory changes. Spleen: Normal in size without focal abnormality. Adrenals/Urinary Tract: The right adrenal gland is within  normal limits. A 1.2 cm nodule is noted in the left adrenal gland, previously characterized as adenoma. The kidneys enhance symmetrically. No renal calculus or hydronephrosis bilaterally. There is diffuse bladder wall thickening with perivesicular fat stranding. A Foley catheter is in place. Stomach/Bowel: A PEG tube terminates in the gastric body. Appendix appears normal. No bowel obstruction, free air or pneumatosis is seen. Left lower quadrant colostomy is noted. A few scattered diverticula are noted along the colon without evidence of diverticulitis. There is bowel wall thickening involving the colon at the splenic flexure with mild associated fat stranding. Vascular/Lymphatic: Aortic atherosclerosis. Nonspecific prominent lymph nodes are noted in the periaortic space on the left. Reproductive: Prostate gland is enlarged with multiple brachytherapy seeds. Other: A small amount of perisplenic free fluid is noted extending into the anterior pararenal space on the left. Subcutaneous air is noted in the in low anterior abdominal wall on the right which may be iatrogenic. Musculoskeletal: There is a large sacral decubitus ulcer in the midline posterior to the distal sacrum and coccyx with a associated bony  erosions which have progressed from the previous exam. No abscess is seen. Degenerative changes are present in the lumbar spine. There is bilateral spondylolysis at L5 with grade 1 anterolisthesis at L5-S1. IMPRESSION: 1. Interval enlargement of sacral decubitus ulcer with associated erosions involving the distal sacrum and coccyx, concerning for osteomyelitis. No abscess is seen. 2. Diffuse bladder wall thickening with surrounding fat stranding, suggesting infectious or inflammatory cystitis. 3. Mild colonic wall thickening at the splenic flexure with surrounding fat stranding and a small amount of free fluid in the left upper quadrant, possible focal colitis. 4. Bilateral lower lobe bronchiectasis with associated  atelectasis or infiltrate. 5. Left lower lobe pulmonary nodules measuring up to 7 mm. Non-contrast chest CT at 6-12 months is recommended. If the nodule is stable at time of repeat CT, then future CT at 18-24 months (from today's scan) is considered optional for low-risk patients, but is recommended for high-risk patients. This recommendation follows the consensus statement: Guidelines for Management of Incidental Pulmonary Nodules Detected on CT Images: From the Fleischner Society 2017; Radiology 2017; 284:228-243. 6. Emphysema. 7. Aortic atherosclerosis with aneurysmal dilatation of the ascending aorta measuring 4.1 cm. Recommend annual imaging followup by CTA or MRA. This recommendation follows 2010 ACCF/AHA/AATS/ACR/ASA/SCA/SCAI/SIR/STS/SVM Guidelines for the Diagnosis and Management of Patients with Thoracic Aortic Disease. Circulation. 2010; 121: N829-F621. Aortic aneurysm NOS (ICD10-I71.9) 8. Cholelithiasis. 9. Remaining ancillary findings as described above. Electronically Signed   By: Thornell Sartorius M.D.   On: 05/07/2023 22:41   DG Chest Port 1 View Result Date: 05/07/2023 CLINICAL DATA:  Altered mental status and shortness of breath EXAM: PORTABLE CHEST 1 VIEW COMPARISON:  02/25/2023 FINDINGS: Sternotomy and CABG. Stable cardiomediastinal silhouette. Aortic atherosclerotic calcification. Low lung volumes. Chronic bronchitic changes. Interstitial coarsening in the left lower lung has increased compared to 02/25/2023. No pleural effusion or pneumothorax. IMPRESSION: Increased interstitial coarsening in the left lower lung which may be due to atelectasis or infection. Electronically Signed   By: Minerva Fester M.D.   On: 05/07/2023 21:20    Pending Labs Unresulted Labs (From admission, onward)     Start     Ordered   05/15/23 0500  Creatinine, serum  (enoxaparin (LOVENOX)    CrCl >/= 30 ml/min)  Weekly,   R     Comments: while on enoxaparin therapy    05/08/23 0004   05/07/23 1940  Urine  Culture  Once,   R        05/07/23 1940            Vitals/Pain Today's Vitals   05/08/23 0110 05/08/23 0115 05/08/23 0130 05/08/23 0139  BP: (!) 84/55 (!) 79/55 (!) 74/48 (!) 78/44  Pulse: (!) 111 (!) 109 (!) 111 (!) 113  Resp: 17 20 20  (!) 21  Temp:      TempSrc:      SpO2: 96% 94% 93% 93%  Weight:      Height:      PainSc:        Isolation Precautions No active isolations  Medications Medications  vancomycin (VANCOCIN) IVPB 1000 mg/200 mL premix (has no administration in time range)  enoxaparin (LOVENOX) injection 40 mg (has no administration in time range)  ceFEPIme (MAXIPIME) 2 g in sodium chloride 0.9 % 100 mL IVPB (has no administration in time range)  metroNIDAZOLE (FLAGYL) IVPB 500 mg (500 mg Intravenous New Bag/Given 05/08/23 0112)  0.9 %  sodium chloride infusion ( Intravenous Rate/Dose Change 05/08/23 0119)  prochlorperazine (COMPAZINE) injection 5 mg (  has no administration in time range)  insulin aspart (novoLOG) injection 0-9 Units (1 Units Subcutaneous Given 05/08/23 0113)  lactated ringers bolus 1,000 mL (0 mLs Intravenous Stopped 05/07/23 2228)  ceFEPIme (MAXIPIME) 2 g in sodium chloride 0.9 % 100 mL IVPB (0 g Intravenous Stopped 05/07/23 2033)  vancomycin (VANCOREADY) IVPB 1750 mg/350 mL (0 mg Intravenous Stopped 05/07/23 2218)  iohexol (OMNIPAQUE) 300 MG/ML solution 100 mL (100 mLs Intravenous Contrast Given 05/07/23 2054)  sodium chloride 0.9 % bolus 1,000 mL (0 mLs Intravenous Stopped 05/08/23 0113)  midodrine (PROAMATINE) tablet 10 mg (10 mg Per Tube Given 05/08/23 0114)  midodrine (PROAMATINE) tablet 10 mg (10 mg Per Tube Given 05/08/23 0123)    Mobility non-ambulatory     Focused Assessments    R Recommendations: See Admitting Provider Note  Report given to:   Additional Notes:

## 2023-05-08 NOTE — Inpatient Diabetes Management (Signed)
Inpatient Diabetes Program Recommendations  AACE/ADA: New Consensus Statement on Inpatient Glycemic Control (2015)  Target Ranges:  Prepandial:   less than 140 mg/dL      Peak postprandial:   less than 180 mg/dL (1-2 hours)      Critically ill patients:  140 - 180 mg/dL   Lab Results  Component Value Date   GLUCAP 262 (H) 05/08/2023   HGBA1C 6.5 (H) 02/10/2023    Review of Glycemic Control  Latest Reference Range & Units 05/08/23 01:10 05/08/23 04:30 05/08/23 07:31  Glucose-Capillary 70 - 99 mg/dL 161 (H) 096 (H) 045 (H)  (H): Data is abnormally high  Diabetes history: DM2 Outpatient Diabetes medications: Lantus 6 units every day, Novolog 0-10 units TID Current orders for Inpatient glycemic control: Novolog 0-9 units TID  Inpatient Diabetes Program Recommendations:    Semglee 8 units every day  Will continue to follow while inpatient.  Thank you, Dulce Sellar, MSN, CDCES Diabetes Coordinator Inpatient Diabetes Program [phone] (team pager from 8a-5p)

## 2023-05-08 NOTE — Progress Notes (Signed)
eLink Physician-Brief Progress Note Patient Name: Eddie Reed. DOB: 03/22/1951 MRN: [id number]   Date of Service  05/08/2023  HPI/Events of Note  Patient admitted to the Hospitalist service for septic shock secondary to infected decubitus ulcer with osteomyelitis, he was transferred to the ICU due to hypotension refractory to volume resuscitation and peripheral vasopressor protocol Norepinephrine.  eICU Interventions  New Patient Evaluation.        [name] 05/08/2023, 5:08 AM

## 2023-05-08 NOTE — Plan of Care (Signed)
  Problem: Education: Goal: Ability to describe self-care measures that may prevent or decrease complications (Diabetes Survival Skills Education) will improve Outcome: Not Progressing   Problem: Fluid Volume: Goal: Ability to maintain a balanced intake and output will improve Outcome: Not Progressing   Problem: Metabolic: Goal: Ability to maintain appropriate glucose levels will improve Outcome: Not Progressing   Problem: Nutritional: Goal: Maintenance of adequate nutrition will improve Outcome: Not Progressing Goal: Progress toward achieving an optimal weight will improve Outcome: Not Progressing

## 2023-05-08 NOTE — ED Provider Notes (Signed)
[facility] Provider Note  CSN: [id number] Arrival date & time: 05/07/23 1903  Chief Complaint(s) Altered Mental Status  HPI Eddie WEIDNER Sr. is a 72 y.o. male with PMH functional and ambulatory decline after a new placement with likely chronic sacral decubitus ulcer, T2DM, CVA, BPH, psoriatic arthritis who presents emergency department for evaluation of altered mental status.  EMS called to [facility] for progressive altered mental status and shortness of breath.  Patient normally on room air and found to be 82% on room air requiring 4 L nasal cannula to maintain oxygen saturations.  Blood pressure soft on arrival and patient borderline febrile here in the ER.  Patient arrives with foul smell from his sacral wound as well as an additional wound on the right calf but additional history unable to be obtained given patient's current altered mental status   Past Medical History Past Medical History:  Diagnosis Date   Allergy history, drug    plavix   Anxiety    Arm pain    continued discomfort in his arm- seeing Dr. Cliffton Asters   Blind right eye    CAD (coronary artery disease)    catheterizzation. 11/10/07. patent LIMA to the LAD with competitive filling, patent SVG to diagonal that enters a bifurcation point, occluded SVG to the distal marginal w/some collateralization, patent SVG to distal posterior lateral. medical therapy recommended.  EF 55% echo. June,2009. hypokinesis of the mid and distal septal wall. technically difficult study.   Carotid artery disease (HCC)    Doppler, July, 2012, 0-39% bilateral, followup 2 years   Cellulitis    s/p right lower extremity cellulitis   Claudication (HCC)    Arterial Dopplers, July, 2012, normal with normal ABI   COPD (chronic obstructive pulmonary disease) (HCC)    Diabetes mellitus    type not specified   Drug intolerance    Patient is intolerant to both aspirin and Plavix.   Dyslipidemia    ED (erectile dysfunction)     Ejection fraction    EF 55%, echo, June, 2009, hypokinesis mid/distal septal wall, technically difficult study   Fibromyalgia    GERD (gastroesophageal reflux disease)    Glaucoma    right eye   H/O alcohol abuse    Alcohol abuse in the past   Hypertension    Polyneuropathy in diabetes(357.2) 10/06/2013   Psoriatic arthritis (HCC)    Ringing in ear    June, 2012 /  carotid Dopplers, in July, 2012, 0-39% bilateral   S/P CABG x 1 09/18/2007   2009   Stroke due to embolism of cerebral artery (HCC) 03/08/2015   Right MCA   Tobacco abuse    Patient Active Problem List   Diagnosis Date Noted   Protein-calorie malnutrition, severe 05/08/2023   Osteomyelitis (HCC) 05/07/2023   Pressure injury of skin with suspected deep tissue injury 02/18/2023   S/P total knee arthroplasty, left 02/12/2023   Unilateral primary osteoarthritis, left knee 10/25/2022   Benign localized prostatic hyperplasia with lower urinary tract symptoms (LUTS) 09/11/2020   Urinary retention 09/11/2020   Stroke due to embolism of cerebral artery (HCC) 03/08/2015   Diabetic polyneuropathy 10/06/2013   Drug intolerance    Ringing in ear    Claudication (HCC)    Carotid artery disease (HCC)    Dyslipidemia    Hypertension    CAD (coronary artery disease)    Allergy history, drug    Diabetes mellitus (HCC)    Fibromyalgia    Arm  pain    Ejection fraction    Tobacco abuse    S/P CABG x 1 09/18/2007   Home Medication(s) Prior to Admission medications   Medication Sig Start Date End Date Taking? Authorizing Provider  ascorbic acid (VITAMIN C) 500 MG tablet Take 500 mg by mouth daily.   Yes [provider]  aspirin EC 81 MG tablet 81 mg daily. Via G-tube   Yes [provider]  atorvastatin (LIPITOR) 80 MG tablet Take 80 mg by mouth every evening. 03/04/23  Yes [provider]  carvedilol (COREG) 3.125 MG tablet Take 3.125 mg by mouth 2 (two) times daily with a meal.   Yes [provider]  doxazosin (CARDURA) 4 MG tablet Place 4 mg into feeding tube daily.   Yes [provider]  enoxaparin (LOVENOX) 40 MG/0.4ML injection Inject 40 mg into the skin daily.   Yes [provider]  gabapentin (NEURONTIN) 400 MG capsule Take 800 mg by mouth 3 (three) times daily. 03/04/23  Yes [provider]  insulin aspart (NOVOLOG) 100 UNIT/ML injection Inject 0-10 Units into the skin 3 (three) times daily before meals. 0-59 = 0 units 60-200 = 2 units 201-250 = 4 units 251-300 = 6 units 301-350 = 8 units 351-400 = 10 units 401-500 =  0 units (call MD)   Yes [provider]  insulin glargine (LANTUS) 100 UNIT/ML Solostar Pen Inject 6 Units into the skin every evening.   Yes [provider]  lansoprazole (PREVACID) 30 MG capsule Take 30 mg by mouth daily at 12 noon.   Yes [provider]  LORazepam (ATIVAN) 0.5 MG tablet Take 0.5 mg by mouth 2 (two) times daily.   Yes [provider]  metoprolol succinate (TOPROL-XL) 25 MG 24 hr tablet TAKE 1/2 TABLET BY MOUTH EVERY DAY (pt needs APPOINTMENT FOR more refills) Patient taking differently: 12.5 mg in the morning and at bedtime. Via feeding tube 12/04/22  Yes Branch, Dorothe Pea, MD  Oxycodone HCl 10 MG TABS Place 1 tablet into feeding tube in the morning, at noon, and at bedtime.   Yes [provider]  polyethylene glycol (MIRALAX / GLYCOLAX) 17 g packet Place 17 g into feeding tube daily.   Yes [provider]  risperiDONE (RISPERDAL) 0.5 MG tablet Take 0.5 mg by mouth in the morning, at noon, and at bedtime.   Yes [provider]  sertraline (ZOLOFT) 25 MG tablet Place 25 mg into feeding tube daily.   Yes [provider]  Zinc Acetate, Oral, (ZINC ACETATE PO) Place 1 tablet into feeding tube daily.   Yes [provider]  oxycodone (OXY-IR) 5 MG capsule Place 5 mg into feeding tube once.    [provider]  Past Surgical History Past Surgical History:  Procedure Laterality Date   CATARACT EXTRACTION Right    CORONARY ARTERY BYPASS GRAFT     CYSTOSCOPY WITH INSERTION OF UROLIFT N/A 12/04/2020   Procedure: CYSTOSCOPY WITH INSERTION OF UROLIFT;  Surgeon: Malen Gauze, MD;  Location: [facility];  Service: Urology;  Laterality: N/A;   TOTAL KNEE ARTHROPLASTY Left 02/12/2023   Procedure: LEFT TOTAL KNEE ARTHROPLASTY;  Surgeon: [name], MD;  Location: [facility];  Service: Orthopedics;  Laterality: Left;  Needs RNFA   Family History Family History  Problem Relation Age of Onset   Dementia Mother    Diabetes Mother    Coronary artery disease Father    Heart attack Father    Cancer Brother    Coronary artery disease Other        family hx    Social History Social History   Tobacco Use   Smoking status: Every Day    Current packs/day: 0.50    Average packs/day: 0.5 packs/day for 61.9 years (31.0 ttl pk-yrs)    Types: Cigarettes    Start date: 05/31/1961   Smokeless tobacco: Never  Vaping Use   Vaping status: Never Used  Substance Use Topics   Alcohol use: Yes    Alcohol/week: 0.0 standard drinks of alcohol    Comment: occasionally   Drug use: No   Allergies Plavix [clopidogrel], Levaquin [levofloxacin], Cymbalta [duloxetine hcl], Synthroid [levothyroxine], Paxil [paroxetine], and Terbinafine hcl  Review of Systems Review of Systems  Unable to perform ROS: Mental status change    Physical Exam Vital Signs  I have reviewed the triage vital signs BP (!) 127/32   Pulse 66   Temp 98 F (36.7 C)   Resp 16   Ht 5\' 10"  (1.778 m)   Wt 76.4 kg   SpO2 100%   BMI 24.17 kg/m   Physical Exam Constitutional:      Appearance: Normal appearance. He is ill-appearing.  HENT:     Head: Normocephalic and atraumatic.     Nose: No congestion or rhinorrhea.  Eyes:      General:        Right eye: No discharge.        Left eye: No discharge.     Extraocular Movements: Extraocular movements intact.     Pupils: Pupils are equal, round, and reactive to light.  Cardiovascular:     Rate and Rhythm: Normal rate and regular rhythm.     Heart sounds: No murmur heard. Pulmonary:     Effort: No respiratory distress.     Breath sounds: No wheezing or rales.  Abdominal:     General: There is no distension.     Tenderness: There is no abdominal tenderness.  Musculoskeletal:        General: Normal range of motion.     Cervical back: Normal range of motion.  Skin:    General: Skin is warm and dry.     Findings: Lesion present.  Neurological:     Mental Status: He is alert. He is disoriented.      ED Results and Treatments Labs (all labs ordered are listed, but only abnormal results are displayed) Labs Reviewed  COMPREHENSIVE METABOLIC PANEL - Abnormal; Notable for the following components:      Result Value   Sodium 128 (*)    Chloride 92 (*)    Glucose, Bld 170 (*)    BUN 44 (*)    Calcium 7.8 (*)  Total Protein 5.7 (*)    Albumin 1.8 (*)    AST 68 (*)    ALT 94 (*)    All other components within normal limits  LACTIC ACID, PLASMA - Abnormal; Notable for the following components:   Lactic Acid, Venous 2.1 (*)    All other components within normal limits  CBC WITH DIFFERENTIAL/PLATELET - Abnormal; Notable for the following components:   WBC 20.6 (*)    RBC 3.74 (*)    Hemoglobin 9.9 (*)    HCT 31.5 (*)    RDW 15.8 (*)    Platelets 443 (*)    Neutro Abs 15.9 (*)    Monocytes Absolute 2.6 (*)    Abs Immature Granulocytes 0.13 (*)    All other components within normal limits  URINALYSIS, W/ REFLEX TO CULTURE (INFECTION SUSPECTED) - Abnormal; Notable for the following components:   Color, Urine AMBER (*)    APPearance CLOUDY (*)    pH 9.0 (*)    Protein, ur >=300 (*)    Leukocytes,Ua LARGE (*)    Bacteria, UA MANY (*)    All other  components within normal limits  CBC - Abnormal; Notable for the following components:   WBC 30.5 (*)    RBC 3.10 (*)    Hemoglobin 8.3 (*)    HCT 26.0 (*)    RDW 15.9 (*)    All other components within normal limits  COMPREHENSIVE METABOLIC PANEL - Abnormal; Notable for the following components:   Sodium 130 (*)    CO2 20 (*)    Glucose, Bld 214 (*)    BUN 43 (*)    Calcium 6.6 (*)    Total Protein 4.3 (*)    Albumin <1.5 (*)    AST 50 (*)    ALT 67 (*)    All other components within normal limits  GLUCOSE, CAPILLARY - Abnormal; Notable for the following components:   Glucose-Capillary 193 (*)    All other components within normal limits  GLUCOSE, CAPILLARY - Abnormal; Notable for the following components:   Glucose-Capillary 262 (*)    All other components within normal limits  GLUCOSE, CAPILLARY - Abnormal; Notable for the following components:   Glucose-Capillary 278 (*)    All other components within normal limits  CBG MONITORING, ED - Abnormal; Notable for the following components:   Glucose-Capillary 145 (*)    All other components within normal limits  CULTURE, BLOOD (ROUTINE X 2)  CULTURE, BLOOD (ROUTINE X 2)  MRSA NEXT GEN BY PCR, NASAL  URINE CULTURE  LACTIC ACID, PLASMA  PROTIME-INR  LACTIC ACID, PLASMA  LACTIC ACID, PLASMA  MAGNESIUM  PHOSPHORUS  URINALYSIS, W/ REFLEX TO CULTURE (INFECTION SUSPECTED)  COPPER, SERUM                                                                                                                          Radiology CT CHEST ABDOMEN PELVIS W CONTRAST Result Date: 05/07/2023 CLINICAL  DATA:  Sepsis, history of bedsore. EXAM: CT CHEST, ABDOMEN, AND PELVIS WITH CONTRAST TECHNIQUE: Multidetector CT imaging of the chest, abdomen and pelvis was performed following the standard protocol during bolus administration of intravenous contrast. RADIATION DOSE REDUCTION: This exam was performed according to the departmental dose-optimization  program which includes automated exposure control, adjustment of the mA and/or kV according to patient size and/or use of iterative reconstruction technique. CONTRAST:  OMNIPAQUE IOHEXOL 300 MG/ML  SOLN COMPARISON:  02/25/2023. FINDINGS: CT CHEST FINDINGS Cardiovascular: The heart is normal in size and there is no pericardial effusion. Multi-vessel coronary artery calcifications are noted. There is atherosclerotic calcification of the aorta with aneurysmal dilatation of the ascending aorta measuring 4.1 cm. Pulmonary trunk is normal in caliber. Mediastinum/Nodes: No enlarged mediastinal, hilar, or axillary lymph nodes. Thyroid gland, trachea, and esophagus demonstrate no significant findings. Lungs/Pleura: Advanced paraseptal and centrilobular emphysematous changes are present in the lungs. Bronchiectasis is noted in the lower lobes bilaterally with a associated atelectasis or infiltrate. There is a 7 mm nodule in the left lower lobe, axial image 91. There is a 5 mm nodule in the left lower lobe, axial image 86. No effusion or pneumothorax is seen. Musculoskeletal: Sternotomy wires are noted. Degenerative changes are present in the thoracic spine. An old healed rib fracture is noted on the left. No acute osseous abnormality is seen. CT ABDOMEN PELVIS FINDINGS Hepatobiliary: No focal liver abnormality is seen. Fatty infiltration of the liver is noted. Stones are present within the gallbladder. No biliary ductal dilatation is seen. Pancreas: Unremarkable. No pancreatic ductal dilatation or surrounding inflammatory changes. Spleen: Normal in size without focal abnormality. Adrenals/Urinary Tract: The right adrenal gland is within normal limits. A 1.2 cm nodule is noted in the left adrenal gland, previously characterized as adenoma. The kidneys enhance symmetrically. No renal calculus or hydronephrosis bilaterally. There is diffuse bladder wall thickening with perivesicular fat stranding. A Foley catheter is in  place. Stomach/Bowel: A PEG tube terminates in the gastric body. Appendix appears normal. No bowel obstruction, free air or pneumatosis is seen. Left lower quadrant colostomy is noted. A few scattered diverticula are noted along the colon without evidence of diverticulitis. There is bowel wall thickening involving the colon at the splenic flexure with mild associated fat stranding. Vascular/Lymphatic: Aortic atherosclerosis. Nonspecific prominent lymph nodes are noted in the periaortic space on the left. Reproductive: Prostate gland is enlarged with multiple brachytherapy seeds. Other: A small amount of perisplenic free fluid is noted extending into the anterior pararenal space on the left. Subcutaneous air is noted in the in low anterior abdominal wall on the right which may be iatrogenic. Musculoskeletal: There is a large sacral decubitus ulcer in the midline posterior to the distal sacrum and coccyx with a associated bony erosions which have progressed from the previous exam. No abscess is seen. Degenerative changes are present in the lumbar spine. There is bilateral spondylolysis at L5 with grade 1 anterolisthesis at L5-S1. IMPRESSION: 1. Interval enlargement of sacral decubitus ulcer with associated erosions involving the distal sacrum and coccyx, concerning for osteomyelitis. No abscess is seen. 2. Diffuse bladder wall thickening with surrounding fat stranding, suggesting infectious or inflammatory cystitis. 3. Mild colonic wall thickening at the splenic flexure with surrounding fat stranding and a small amount of free fluid in the left upper quadrant, possible focal colitis. 4. Bilateral lower lobe bronchiectasis with associated atelectasis or infiltrate. 5. Left lower lobe pulmonary nodules measuring up to 7 mm. Non-contrast chest CT at 6-12  months is recommended. If the nodule is stable at time of repeat CT, then future CT at 18-24 months (from today's scan) is considered optional for low-risk patients, but  is recommended for high-risk patients. This recommendation follows the consensus statement: Guidelines for Management of Incidental Pulmonary Nodules Detected on CT Images: From the Fleischner Society 2017; Radiology 2017; 284:228-243. 6. Emphysema. 7. Aortic atherosclerosis with aneurysmal dilatation of the ascending aorta measuring 4.1 cm. Recommend annual imaging followup by CTA or MRA. This recommendation follows 2010 ACCF/AHA/AATS/ACR/ASA/SCA/SCAI/SIR/STS/SVM Guidelines for the Diagnosis and Management of Patients with Thoracic Aortic Disease. Circulation. 2010; 121: V956-L875. Aortic aneurysm NOS (ICD10-I71.9) 8. Cholelithiasis. 9. Remaining ancillary findings as described above. Electronically Signed   By: Thornell Sartorius M.D.   On: 05/07/2023 22:41   DG Chest Port 1 View Result Date: 05/07/2023 CLINICAL DATA:  Altered mental status and shortness of breath EXAM: PORTABLE CHEST 1 VIEW COMPARISON:  02/25/2023 FINDINGS: Sternotomy and CABG. Stable cardiomediastinal silhouette. Aortic atherosclerotic calcification. Low lung volumes. Chronic bronchitic changes. Interstitial coarsening in the left lower lung has increased compared to 02/25/2023. No pleural effusion or pneumothorax. IMPRESSION: Increased interstitial coarsening in the left lower lung which may be due to atelectasis or infection. Electronically Signed   By: Minerva Fester M.D.   On: 05/07/2023 21:20    Pertinent labs & imaging results that were available during my care of the patient were reviewed by me and considered in my medical decision making (see MDM for details).  Medications Ordered in ED Medications  vancomycin (VANCOCIN) IVPB 1000 mg/200 mL premix (0 mg Intravenous Stopped 05/08/23 0819)  enoxaparin (LOVENOX) injection 40 mg (40 mg Subcutaneous Given 05/08/23 1101)  ceFEPIme (MAXIPIME) 2 g in sodium chloride 0.9 % 100 mL IVPB (2 g Intravenous New Bag/Given 05/08/23 0500)  metroNIDAZOLE (FLAGYL) IVPB 500 mg (500 mg Intravenous  New Bag/Given 05/08/23 1102)  0.9 %  sodium chloride infusion ( Intravenous Transfusing/Transfer 05/08/23 0251)  prochlorperazine (COMPAZINE) injection 5 mg (has no administration in time range)  insulin aspart (novoLOG) injection 0-9 Units (5 Units Subcutaneous Given 05/08/23 0826)  0.9 %  sodium chloride infusion (250 mLs Intravenous Bolus from Bag 05/08/23 0223)  norepinephrine (LEVOPHED) 4mg  in (0.016 mg/mL) premix infusion (9 mcg/min Intravenous Infusion Verify 05/08/23 1034)  albumin human 25 % solution 25 g (25 g Intravenous Not Given 05/08/23 0416)  vasopressin (PITRESSIN) 20 Units in 100 mL (0.2 unit/mL) infusion-*FOR SHOCK* (0.03 Units/min Intravenous Infusion Verify 05/08/23 1034)  hydrocortisone sodium succinate (SOLU-CORTEF) 100 MG injection 100 mg (has no administration in time range)  Chlorhexidine Gluconate Cloth 2 % PADS 6 each (has no administration in time range)  feeding supplement (PROSource TF20) liquid 60 mL (has no administration in time range)  feeding supplement (JEVITY 1.5 CAL/FIBER) liquid 1,000 mL (has no administration in time range)  nutrition supplement (JUVEN) (JUVEN) powder packet 1 packet (has no administration in time range)  free water 200 mL (has no administration in time range)  lactated ringers bolus 1,000 mL (0 mLs Intravenous Stopped 05/07/23 2228)  ceFEPIme (MAXIPIME) 2 g in sodium chloride 0.9 % 100 mL IVPB (0 g Intravenous Stopped 05/07/23 2033)  vancomycin (VANCOREADY) IVPB 1750 mg/350 mL (0 mg Intravenous Stopped 05/07/23 2218)  iohexol (OMNIPAQUE) 300 MG/ML solution 100 mL (100 mLs Intravenous Contrast Given 05/07/23 2054)  sodium chloride 0.9 % bolus 1,000 mL (0 mLs Intravenous Stopped 05/08/23 0113)  midodrine (PROAMATINE) tablet 10 mg (10 mg Per Tube Given 05/08/23 0114)  midodrine (PROAMATINE) tablet  10 mg (10 mg Per Tube Given 05/08/23 0123)  sodium chloride 0.9 % bolus 500 mL (0 mLs Intravenous Stopped 05/08/23 0524)  hydrocortisone  sodium succinate (SOLU-CORTEF) 100 MG injection 100 mg (100 mg Intravenous Given 05/08/23 0516)                                                                                                                                     Procedures .Critical Care  Performed by: Glendora Score, MD Authorized by: Glendora Score, MD   Critical care provider statement:    Critical care time (minutes):  30   Critical care was necessary to treat or prevent imminent or life-threatening deterioration of the following conditions:  Sepsis   Critical care was time spent personally by me on the following activities:  Development of treatment plan with patient or surrogate, discussions with consultants, evaluation of patient's response to treatment, examination of patient, ordering and review of laboratory studies, ordering and review of radiographic studies, ordering and performing treatments and interventions, pulse oximetry, re-evaluation of patient's condition and review of old charts   (including critical care time)  Medical Decision Making / ED Course   This patient presents to the ED for concern of altered mental, fever, this involves an extensive number of treatment options, and is a complaint that carries with it a high risk of complications and morbidity.  The differential diagnosis includes sepsis, bacteremia, sacral osteomyelitis, UTI, pneumonia, dehydration, electrolyte abnormality  MDM: Patient seen emergency room for evaluation of multiple complaints as described above.  Physical exam reveals an ill-appearing tachypneic patient with a large foul-smelling sacral decubitus ulcer, additional ulcer on the lateral right calf, sediment in patient's urinary catheter.  Patient meets SIRS criteria on arrival and broad-spectrum antibiotics initiated.  Laboratory evaluation with a leukocytosis to 20.6, hemoglobin 9.9 which is downtrending from previous, urinalysis concerning for infection, lactic acid 2.1 and  fluid resuscitation begun. chest x-ray with possible infiltrate in the left lung base.  Follow-up CT imaging with no pulmonary pathology but does show signs concerning for sacral osteomyelitis.  Given new oxygen requirement, sacral osteomyelitis and UTI with concern for underlying bacteremia patient require hospital admission.  Patient admitted   Additional history obtained: -Additional history obtained from son -External records from outside source obtained and reviewed including: Chart review including previous notes, labs, imaging, consultation notes   Lab Tests: -I ordered, reviewed, and interpreted labs.   The pertinent results include:   Labs Reviewed  COMPREHENSIVE METABOLIC PANEL - Abnormal; Notable for the following components:      Result Value   Sodium 128 (*)    Chloride 92 (*)    Glucose, Bld 170 (*)    BUN 44 (*)    Calcium 7.8 (*)    Total Protein 5.7 (*)    Albumin 1.8 (*)    AST 68 (*)    ALT 94 (*)    All  other components within normal limits  LACTIC ACID, PLASMA - Abnormal; Notable for the following components:   Lactic Acid, Venous 2.1 (*)    All other components within normal limits  CBC WITH DIFFERENTIAL/PLATELET - Abnormal; Notable for the following components:   WBC 20.6 (*)    RBC 3.74 (*)    Hemoglobin 9.9 (*)    HCT 31.5 (*)    RDW 15.8 (*)    Platelets 443 (*)    Neutro Abs 15.9 (*)    Monocytes Absolute 2.6 (*)    Abs Immature Granulocytes 0.13 (*)    All other components within normal limits  URINALYSIS, W/ REFLEX TO CULTURE (INFECTION SUSPECTED) - Abnormal; Notable for the following components:   Color, Urine AMBER (*)    APPearance CLOUDY (*)    pH 9.0 (*)    Protein, ur >=300 (*)    Leukocytes,Ua LARGE (*)    Bacteria, UA MANY (*)    All other components within normal limits  CBC - Abnormal; Notable for the following components:   WBC 30.5 (*)    RBC 3.10 (*)    Hemoglobin 8.3 (*)    HCT 26.0 (*)    RDW 15.9 (*)    All other  components within normal limits  COMPREHENSIVE METABOLIC PANEL - Abnormal; Notable for the following components:   Sodium 130 (*)    CO2 20 (*)    Glucose, Bld 214 (*)    BUN 43 (*)    Calcium 6.6 (*)    Total Protein 4.3 (*)    Albumin <1.5 (*)    AST 50 (*)    ALT 67 (*)    All other components within normal limits  GLUCOSE, CAPILLARY - Abnormal; Notable for the following components:   Glucose-Capillary 193 (*)    All other components within normal limits  GLUCOSE, CAPILLARY - Abnormal; Notable for the following components:   Glucose-Capillary 262 (*)    All other components within normal limits  GLUCOSE, CAPILLARY - Abnormal; Notable for the following components:   Glucose-Capillary 278 (*)    All other components within normal limits  CBG MONITORING, ED - Abnormal; Notable for the following components:   Glucose-Capillary 145 (*)    All other components within normal limits  CULTURE, BLOOD (ROUTINE X 2)  CULTURE, BLOOD (ROUTINE X 2)  MRSA NEXT GEN BY PCR, NASAL  URINE CULTURE  LACTIC ACID, PLASMA  PROTIME-INR  LACTIC ACID, PLASMA  LACTIC ACID, PLASMA  MAGNESIUM  PHOSPHORUS  URINALYSIS, W/ REFLEX TO CULTURE (INFECTION SUSPECTED)  COPPER, SERUM     Imaging Studies ordered: I ordered imaging studies including chest x-ray, CT CAP I independently visualized and interpreted imaging. I agree with the radiologist interpretation   Medicines ordered and prescription drug management: Meds ordered this encounter  Medications   lactated ringers bolus 1,000 mL   ceFEPIme (MAXIPIME) 2 g in sodium chloride 0.9 % 100 mL IVPB   vancomycin (VANCOREADY) IVPB 1750 mg/350 mL    Indication::   Sepsis   vancomycin (VANCOCIN) IVPB 1000 mg/200 mL premix    Indication::   Sepsis   iohexol (OMNIPAQUE) 300 MG/ML solution 100 mL   enoxaparin (LOVENOX) injection 40 mg   ceFEPIme (MAXIPIME) 2 g in sodium chloride 0.9 % 100 mL IVPB   metroNIDAZOLE (FLAGYL) IVPB 500 mg    Antibiotic  Indication::   Other Indication (list below)   0.9 %  sodium chloride infusion   prochlorperazine (COMPAZINE) injection 5 mg  sodium chloride 0.9 % bolus 1,000 mL   midodrine (PROAMATINE) tablet 10 mg   insulin aspart (novoLOG) injection 0-9 Units    Correction coverage::   Sensitive (thin, NPO, renal)    CBG < 70::   Implement Hypoglycemia Standing Orders and refer to Hypoglycemia Standing Orders sidebar report    CBG 70 - 120::   0 units    CBG 121 - 150::   1 unit    CBG 151 - 200::   2 units    CBG 201 - 250::   3 units    CBG 251 - 300::   5 units    CBG 301 - 350::   7 units    CBG 351 - 400:   9 units    CBG > 400:   call MD and obtain STAT lab verification   midodrine (PROAMATINE) tablet 10 mg   DISCONTD: albumin human 5 % solution 25 g   DISCONTD: norepinephrine (LEVOPHED) 4mg  in (0.016 mg/mL) premix infusion   norepinephrine (LEVOPHED) 4-5 MG/250ML-% infusion SOLN    Turner, Charlotte F: cabinet override   0.9 %  sodium chloride infusion   norepinephrine (LEVOPHED) 4mg  in (0.016 mg/mL) premix infusion    Okay per Dr. Margo Aye.    IV Access:   Peripheral   sodium chloride 0.9 % bolus 500 mL   albumin human 25 % solution 25 g   vasopressin (PITRESSIN) 20 Units in 100 mL (0.2 unit/mL) infusion-*FOR SHOCK*   hydrocortisone sodium succinate (SOLU-CORTEF) 100 MG injection 100 mg    IV hydrocortisone will be converted to either a q8h or q12h frequency with the same total daily dose (TDD).  Ordered Dose: 1 to 200 mg TDD; convert to: TDD div q12h.  Ordered Dose: 201 to 300 mg TDD; convert to: TDD div q8h.  Ordered Dose: >300 mg TDD; DAW.   hydrocortisone sodium succinate (SOLU-CORTEF) 100 MG injection 100 mg    IV hydrocortisone will be converted to either a q8h or q12h frequency with the same total daily dose (TDD).  Ordered Dose: 1 to 200 mg TDD; convert to: TDD div q12h.  Ordered Dose: 201 to 300 mg TDD; convert to: TDD div q8h.  Ordered Dose: >300 mg TDD; DAW.    Chlorhexidine Gluconate Cloth 2 % PADS 6 each   feeding supplement (PROSource TF20) liquid 60 mL   feeding supplement (JEVITY 1.5 CAL/FIBER) liquid 1,000 mL   nutrition supplement (JUVEN) (JUVEN) powder packet 1 packet   free water 200 mL    -I have reviewed the patients home medicines and have made adjustments as needed  Critical interventions Broad-spectrum antibiotics, fluid resuscitation, supplemental oxygen   Cardiac Monitoring: The patient was maintained on a cardiac monitor.  I personally viewed and interpreted the cardiac monitored which showed an underlying rhythm of: NSR  Social Determinants of Health:  Factors impacting patients care include: Lives at [facility].  Extensive discussion had with family about patient's current medical care and prognosis   Reevaluation: After the interventions noted above, I reevaluated the patient and found that they have :improved  Co morbidities that complicate the patient evaluation  Past Medical History:  Diagnosis Date   Allergy history, drug    plavix   Anxiety    Arm pain    continued discomfort in his arm- seeing Dr. Cliffton Asters   Blind right eye    CAD (coronary artery disease)    catheterizzation. 11/10/07. patent LIMA to the LAD with competitive filling,  patent SVG to diagonal that enters a bifurcation point, occluded SVG to the distal marginal w/some collateralization, patent SVG to distal posterior lateral. medical therapy recommended.  EF 55% echo. June,2009. hypokinesis of the mid and distal septal wall. technically difficult study.   Carotid artery disease (HCC)    Doppler, July, 2012, 0-39% bilateral, followup 2 years   Cellulitis    s/p right lower extremity cellulitis   Claudication (HCC)    Arterial Dopplers, July, 2012, normal with normal ABI   COPD (chronic obstructive pulmonary disease) (HCC)    Diabetes mellitus    type not specified   Drug intolerance    Patient is intolerant to both aspirin and Plavix.    Dyslipidemia    ED (erectile dysfunction)    Ejection fraction    EF 55%, echo, June, 2009, hypokinesis mid/distal septal wall, technically difficult study   Fibromyalgia    GERD (gastroesophageal reflux disease)    Glaucoma    right eye   H/O alcohol abuse    Alcohol abuse in the past   Hypertension    Polyneuropathy in diabetes(357.2) 10/06/2013   Psoriatic arthritis (HCC)    Ringing in ear    June, 2012 /  carotid Dopplers, in July, 2012, 0-39% bilateral   S/P CABG x 1 09/18/2007   2009   Stroke due to embolism of cerebral artery (HCC) 03/08/2015   Right MCA   Tobacco abuse       Dispostion: I considered admission for this patient, and patient require hospital admission for sacral osteomyelitis and concern for bacteremia     Final Clinical Impression(s) / ED Diagnoses Final diagnoses:  Osteomyelitis, unspecified site, unspecified type [facility])  Cystitis     @PCDICTATION @    Glendora Score, MD 05/08/23 1226

## 2023-05-08 NOTE — TOC Initial Note (Signed)
Transition of Care (TOC) - Initial/Assessment Note    Patient Details  Name: Eddie TOSTE Sr. MRN: [id number] Date of Birth: 1951/01/26  Transition of Care [facility]) CM/SW Contact:    Villa Herb, LCSWA Phone Number: 05/08/2023, 11:56 AM  Clinical Narrative:                 CSW notes per chart review that pt arrived from [facility]. CSW spoke to [place] with [facility] who states that pt has been there for SNF for the past 9 days.   Current plan is for palliative consult for goals of care conversation. TOC to follow for updates. If plan is for return to [facility] pt will need PT eval and auth prior to returning. TOC to follow.   Expected Discharge Plan: Skilled Nursing Facility Barriers to Discharge: Continued Medical Work up   Patient Goals and CMS Choice Patient states their goals for this hospitalization and ongoing recovery are:: get better CMS Medicare.gov Compare Post Acute Care list provided to:: Patient Choice offered to / list presented to : Patient      Expected Discharge Plan and Services In-house Referral: Clinical Social Work Discharge Planning Services: CM Consult                                          Prior Living Arrangements/Services   Lives with:: Self Patient language and need for interpreter reviewed:: Yes Do you feel safe going back to the place where you live?: Yes      Need for Family Participation in Patient Care: Yes (Comment) Care giver support system in place?: Yes (comment)   Criminal Activity/Legal Involvement Pertinent to Current Situation/Hospitalization: No - Comment as needed  Activities of Daily Living   ADL Screening (condition at time of admission) Independently performs ADLs?: No  Permission Sought/Granted                  Emotional Assessment Appearance:: Appears stated age       Alcohol / Substance Use: Not Applicable Psych Involvement: No (comment)  Admission diagnosis:  Osteomyelitis (HCC) [M86.9] Cystitis  [N30.90] Osteomyelitis, unspecified site, unspecified type (HCC) [M86.9] Patient Active Problem List   Diagnosis Date Noted   Protein-calorie malnutrition, severe 05/08/2023   Osteomyelitis (HCC) 05/07/2023   Pressure injury of skin with suspected deep tissue injury 02/18/2023   S/P total knee arthroplasty, left 02/12/2023   Unilateral primary osteoarthritis, left knee 10/25/2022   Benign localized prostatic hyperplasia with lower urinary tract symptoms (LUTS) 09/11/2020   Urinary retention 09/11/2020   Stroke due to embolism of cerebral artery (HCC) 03/08/2015   Diabetic polyneuropathy 10/06/2013   Drug intolerance    Ringing in ear    Claudication (HCC)    Carotid artery disease (HCC)    Dyslipidemia    Hypertension    CAD (coronary artery disease)    Allergy history, drug    Diabetes mellitus (HCC)    Fibromyalgia    Arm pain    Ejection fraction    Tobacco abuse    S/P CABG x 1 09/18/2007   PCP:  Eloisa Northern, MD Pharmacy:   [facility] - [place], [place] - [address] [place] [address] Phone: [phone] Fax: [phone]  [facility] [place] - [place], [place] - [address] [place] [address] Phone: [phone]  Fax: [phone]     Social Drivers of Health (SDOH) Social History: SDOH Screenings   Food Insecurity: No Food Insecurity (02/12/2023)  Housing: Low Risk  (02/12/2023)  Transportation Needs: No Transportation Needs (02/12/2023)  Utilities: Patient Unable To Answer (05/08/2023)  Tobacco Use: High Risk (02/26/2023)   Received from [facility]  Health Literacy: Medium Risk (01/09/2021)   Received from [facility], [facility]   SDOH Interventions:     Readmission Risk Interventions    05/08/2023   11:55 AM  Readmission Risk Prevention Plan  Transportation Screening Complete  Home Care Screening Complete  Medication Review (RN CM) Complete

## 2023-05-08 NOTE — ED Notes (Signed)
Dr Margo Aye paged- Dr called back- informed of pt BP drop- new orders received.

## 2023-05-08 NOTE — Progress Notes (Addendum)
Initial Nutrition Assessment  DOCUMENTATION CODES:   Severe malnutrition in context of chronic illness  INTERVENTION:   Initiate tube feeding via PEG: Jevity 1.5 at 55 ml/h (1320 ml per day) Prosource TF20 60 ml BID  Provides 2140 kcal, 124 gm protein, 1003 ml free water daily.  Free water flushes 200 ml q 4 hours for a total of 2203 ml daily.  Juven BID via tube, each packet provides 80 calories, 8 grams of carbohydrate, 2.5  grams of protein (collagen), 7 grams of L-arginine and 7 grams of L-glutamine; supplement contains CaHMB, Vitamins C, E, B12 and Zinc to promote wound healing  Recommend check vitamin C, zinc, and copper levels. If vitamin C and zinc levels are WNL, patient does not need high-dose supplementation. If copper is low, recommend supplement with 0.5 mg/kg/d and recheck level in 4 weeks.  NUTRITION DIAGNOSIS:   Severe Malnutrition related to chronic illness (COPD) as evidenced by severe muscle depletion, severe fat depletion, percent weight loss (9% weight loss x 3 months).  GOAL:   Patient will meet greater than or equal to 90% of their needs  MONITOR:   TF tolerance  REASON FOR ASSESSMENT:   Consult Enteral/tube feeding initiation and management  ASSESSMENT:   72 yo male admitted from SNF with septic shock d/t infected decubitus ulcer with osteomyelitis, suspected bacterial PNA. PMH includes dysphagia, PEG, colostomy, HLD, GERD, HTN, DM-2, CAD, R MCA stroke, polyneuropathy, blind R eye, COPD.  Patient lying in bed, opened eyes briefly, but unable to provide any nutrition hx. He has a PEG; unsure of TF regimen PTA.   Labs reviewed. Na 130 CBG: [phone]  Medications reviewed and include solu-cortef, novolog, levophed, vasopressin.  Weight history reviewed. Patient with 9% weight loss within the past 3 months.   Patient was receiving vitamin C and zinc PTA, unsure length of time. Prolonged zinc supplementation can cause copper deficiency. Recommend  checking vitamin C, zinc, and copper levels.   NUTRITION - FOCUSED PHYSICAL EXAM:  Flowsheet Row Most Recent Value  Orbital Region Severe depletion  Upper Arm Region Moderate depletion  Thoracic and Lumbar Region Mild depletion  Buccal Region Severe depletion  Temple Region Severe depletion  Clavicle Bone Region Moderate depletion  Clavicle and Acromion Bone Region Mild depletion  Scapular Bone Region Mild depletion  Dorsal Hand No depletion  Patellar Region Severe depletion  Anterior Thigh Region Severe depletion  Posterior Calf Region Severe depletion  Edema (RD Assessment) None  Hair Reviewed  Eyes Unable to assess  Mouth Unable to assess  Skin Reviewed  Nails Reviewed       Diet Order:   Diet Order             Diet NPO time specified  Diet effective now                   EDUCATION NEEDS:   No education needs have been identified at this time  Skin:  Skin Assessment: Skin Integrity Issues: Skin Integrity Issues:: Unstageable, Stage IV Stage IV: sacrum Unstageable: bilateral buttocks  Last BM:  12/19 colostomy  Height:   Ht Readings from Last 1 Encounters:  05/07/23 5\' 10"  (1.778 m)    Weight:   Wt Readings from Last 1 Encounters:  05/08/23 76.4 kg    Ideal Body Weight:  75.5 kg  BMI:  Body mass index is 24.17 kg/m.  Estimated Nutritional Needs:   Kcal:  2000-2300  Protein:  120-140 gm  Fluid:  2-2.3 L  Gabriel Rainwater RD, LDN, CNSC Please refer to Amion for contact information.

## 2023-05-08 NOTE — Progress Notes (Signed)
Chronic foley catheter replaced per verbal order via Dr. Margo Aye. Temp sensing foley placed d/t fluctuations in temperature

## 2023-05-08 NOTE — Progress Notes (Signed)
Returned to bedside due to the patient having refractory hypotension despite being on maximum dose of peripheral IV vasopressor, Levophed.  Added vasopressin and IV Solu-Cortef to improve blood pressure and maintain MAP above 65.  Repeat stat CBC.  No overt bleeding seen on exam.  The patient is hyper somnolent and minimally interactive.  Will continue to closely monitor and treat as indicated.   Critical care time: 35 minutes.

## 2023-05-08 NOTE — Consult Note (Addendum)
WOC Nurse Consult Note: Reason for Consult: Requested to assess sacrum wounds. Perform remotely after assess photos and notes. Wound type: Sacrum wound stage 4, bilateral bottom gluteo unstageable. Pressure Injury POA: Yes Measurement: Sacrum: aproxx. 11x11x3cm Wound bed: Sacrum 70% red, 30% black  Measurement: Bilateral bottom gluteo unstageable: aproxx. 5x5cm Wound bed: 100% black.  Periwound: intact Dressing procedure/placement/frequency: Sacrum: Apply Aquacel [id number], cover with foam sacrum dressing. Change 3Q or if is saturated. Bilateral bottom gluteo: Apply Intrasite [id number], cover with foam dressing. Change 3Q. Change the Pt position every 2h.  Obs: He has osteomyelitis, if the aggressive care is an option, recommended a surgical team to assess the wounds.  WOC team will not plan to follow further.  Please reconsult if further assistance is needed. Thank-you,  Denyse Amass BSN, RN, [employer], WOC  (Pager: [phone])

## 2023-05-08 NOTE — ED Notes (Addendum)
Dr Delma Post made aware of pt status and current vitals- new orders received

## 2023-05-08 NOTE — Plan of Care (Signed)
  Problem: Education: Goal: Ability to describe self-care measures that may prevent or decrease complications (Diabetes Survival Skills Education) will improve Outcome: Not Progressing   Problem: Fluid Volume: Goal: Ability to maintain a balanced intake and output will improve Outcome: Not Progressing   Problem: Health Behavior/Discharge Planning: Goal: Ability to identify and utilize available resources and services will improve Outcome: Not Progressing Goal: Ability to manage health-related needs will improve Outcome: Not Progressing   Problem: Metabolic: Goal: Ability to maintain appropriate glucose levels will improve Outcome: Not Progressing   Problem: Nutritional: Goal: Maintenance of adequate nutrition will improve 05/08/2023 1926 by Bess Harvest, RN Outcome: Not Progressing 05/08/2023 0639 by Bess Harvest, RN Outcome: Not Progressing Goal: Progress toward achieving an optimal weight will improve Outcome: Not Progressing   Problem: Skin Integrity: Goal: Risk for impaired skin integrity will decrease Outcome: Not Progressing

## 2023-05-09 DIAGNOSIS — E43 Unspecified severe protein-calorie malnutrition: Secondary | ICD-10-CM

## 2023-05-09 DIAGNOSIS — M8668 Other chronic osteomyelitis, other site: Secondary | ICD-10-CM | POA: Diagnosis not present

## 2023-05-09 DIAGNOSIS — R6521 Severe sepsis with septic shock: Secondary | ICD-10-CM

## 2023-05-09 DIAGNOSIS — A419 Sepsis, unspecified organism: Secondary | ICD-10-CM

## 2023-05-09 LAB — BLOOD CULTURE ID PANEL (REFLEXED) - BCID2

## 2023-05-09 LAB — COPPER, SERUM: Copper: 64 ug/dL — ABNORMAL LOW (ref 69–132)

## 2023-05-09 MED ORDER — SALINE SPRAY 0.65 % NA SOLN
1.0000 | NASAL | Status: DC | PRN
  Administered 2023-05-09: 1 via NASAL
  Filled 2023-05-09: qty 44

## 2023-05-09 NOTE — Progress Notes (Signed)
Progress Note   Patient: Eddie Reed ZOX:[id number] DOB: 04/15/1951 DOA: 05/07/2023     2 DOS: the patient was seen and examined on 05/09/2023   Brief hospital admission course: As per H&P written by Dr. Margo Aye on 05/08/2023  Eddie Santee Sr. is a 72 y.o. male with medical history significant for prior CVA, chronic sacral decubitus ulcer, hypertension, type 2 diabetes, hyperlipidemia, diabetic polyneuropathy, chronic anxiety/depression, BPH, who presents to the ED from SNF due to altered mental status and shortness of breath today.  Associated with worsening sacral decubitus ulcer.  EMS was activated.  Upon EMS arrival, the patient was saturating 82% on room air, improved on 4 L nasal cannula.  Reportedly the patient was going to start oral antibiotics today for sacral decubitus wound infection.  He was brought into the ED for further evaluation.   In the ED, somnolent but arousable to voices.  Minimally interactive.  Hypotensive despite IV fluid boluses and 20 mg of oral vasopressor, midodrine.  UA positive for pyuria.  Due to concern for sepsis secondary to sacral decubitus ulcer and presumptive UTI, peripheral blood cultures x 2 and urine culture were obtained.  Empiric IV antibiotics were initiated.     Low blood pressures refractory to IV fluid boluses, IV albumin, and midodrine.  The patient was started on vasopressor, Levophed, requiring ICU level of care.  Admitted by [facility], hospitalist service.   ED Course: Temperature 98.6.  BP 83/54, pulse 107, respiratory 21, O2 saturation 95% on 4 L nasal cannula.  Assessment and Plan: 1-septic shock secondary to UTI and acute on chronic osteomyelitis; patient also with acute metabolic encephalopathy -Very poor prognosis and 0 chances of getting this wound to heal. -After discussing with family members and involving palliative care decision was made to pursuit comfort measures and symptomatic management only -IV antibiotics, IV fluids, pressor  supports and any other medication not intended to keep patient comfortable and address symptomatic management has been discontinued -Appreciate assistance and recommendation by palliative care. -Allowing 24 hours to assess patient for stability; he has a pretty good chances of passing away while in the hospital. -if on 12/21 he has remained stable will discuss possibility of patient being transferred for inpatient hospice for further end-of-life care.  Other medical problems contributing to patient's poor prognosis.: -Lactic acidosis -Hyponatremia -Hypoalbuminemia -Type 2 diabetes with neuropathy -Hypertension -Chronic indwelling Foley catheter due to chronic urinary retention and sacral wound. -Hypoxic respiratory failure requiring 4 L supplementation. -Concern for lower lobe infiltrate aspiration pneumonia    Subjective:  Lethargic, somnolent, did not open his eyes during today's examination.  Appears to be comfortable.  No fever.  Physical Exam: Vitals:   05/08/23 1345 05/08/23 1400 05/09/23 0130 05/09/23 1438  BP: 136/64 (!) 125/56 99/63 (!) 86/54  Pulse: 71 76 83 84  Resp: 17 19 15 18   Temp:    97.6 F (36.4 C)  TempSrc:    Oral  SpO2: 100% 100% 100% 100%  Weight:      Height:       General exam: Comfortable, not following commands, decreased oral intake. Respiratory system: Good air movement, no wheezing, no crackles.  Good saturation on 4 L supplementation (mainly for comfort). Cardiovascular system:RRR. No rubs or gallops.  Soft murmur appreciated on exam. Gastrointestinal system: Abdomen is nondistended, soft and with positive bowel sounds.  PEG tube in place.  Colostomy bag in place.  Stool appreciated inside. Central nervous system: Limited examination; no new focal neurological deficits.  Extremities: No cyanosis or clubbing. Skin: No petechiae; extensive stage IV sacral decubitus ulcer and bilateral ischial wounds.  Positive foul smell and active drainage.  Refer to  epic media for images. Psychiatry: Limited examination; patient not following commands.  Flat affect.   Family Communication: No family at bedside.  Disposition: Status is: Inpatient Remains inpatient appropriate because: Continue symptomatic management and comfort care.   Planned Discharge Destination:  Imminent hospital death versus inpatient hospice.   Time spent: 35 minutes  Author: Vassie Loll, MD 05/09/2023 7:46 PM  For on call review [email or web address].

## 2023-05-09 NOTE — Progress Notes (Signed)
Nutrition Brief Note  Chart reviewed. Pt now transitioning to comfort care.  No further nutrition interventions planned at this time.  Please re-consult as needed.   Allie Evalyne Cortopassi, RDN, LDN Clinical Nutrition  

## 2023-05-09 NOTE — TOC Progression Note (Signed)
Transition of Care (TOC) - Progression Note    Patient Details  Name: Eddie Reed. MRN: [id number] Date of Birth: 09-14-1950  Transition of Care [facility]) CM/SW Contact  Leitha Bleak, RN Phone Number: 05/09/2023, 11:16 AM  Clinical Narrative:   Palliative has met with family. They have decided on comfort care. Discussion to continue, MD following to see how patient does today. TOC following to see what decision with be made for hospice care. MD expects they can make those decision tomorrow.     Expected Discharge Plan: Skilled Nursing Facility Barriers to Discharge: Continued Medical Work up  Expected Discharge Plan and Services In-house Referral: Clinical Social Work Discharge Planning Services: CM Consult    Social Determinants of Health (SDOH) Interventions SDOH Screenings   Food Insecurity: No Food Insecurity (02/12/2023)  Housing: Low Risk  (02/12/2023)  Transportation Needs: No Transportation Needs (02/12/2023)  Utilities: Patient Unable To Answer (05/08/2023)  Tobacco Use: High Risk (05/08/2023)  Health Literacy: Medium Risk (01/09/2021)   Received from [facility], [facility]    Readmission Risk Interventions    05/08/2023   11:55 AM  Readmission Risk Prevention Plan  Transportation Screening Complete  Home Care Screening Complete  Medication Review (RN CM) Complete

## 2023-05-10 DIAGNOSIS — E43 Unspecified severe protein-calorie malnutrition: Secondary | ICD-10-CM | POA: Diagnosis not present

## 2023-05-10 DIAGNOSIS — M8668 Other chronic osteomyelitis, other site: Secondary | ICD-10-CM | POA: Diagnosis not present

## 2023-05-10 DIAGNOSIS — R6521 Severe sepsis with septic shock: Secondary | ICD-10-CM | POA: Diagnosis not present

## 2023-05-10 DIAGNOSIS — A419 Sepsis, unspecified organism: Secondary | ICD-10-CM | POA: Diagnosis not present

## 2023-05-10 LAB — CULTURE, BLOOD (ROUTINE X 2): Special Requests: ADEQUATE

## 2023-05-10 LAB — URINE CULTURE: Culture: >=100000 — AB

## 2023-05-10 NOTE — Progress Notes (Signed)
Notified by nurse that patient /family  want to have ice cream, patient is comfort care and npo. He is requesting ice cream and family would like for him to have some. His son says pt should be able to eat since pt is comfort care , which is very appropriate, so I will discontinue  n.p.o. status . Huey Bienenstock MD

## 2023-05-10 NOTE — Plan of Care (Signed)
  Problem: Education: Goal: Ability to describe self-care measures that may prevent or decrease complications (Diabetes Survival Skills Education) will improve Outcome: Progressing Goal: Individualized Educational Video(s) Outcome: Progressing   Problem: Coping: Goal: Ability to adjust to condition or change in health will improve Outcome: Progressing   Problem: Fluid Volume: Goal: Ability to maintain a balanced intake and output will improve Outcome: Progressing   Problem: Health Behavior/Discharge Planning: Goal: Ability to identify and utilize available resources and services will improve Outcome: Progressing Goal: Ability to manage health-related needs will improve Outcome: Progressing   Problem: Metabolic: Goal: Ability to maintain appropriate glucose levels will improve Outcome: Progressing   Problem: Nutritional: Goal: Maintenance of adequate nutrition will improve Outcome: Progressing Goal: Progress toward achieving an optimal weight will improve Outcome: Progressing   Problem: Skin Integrity: Goal: Risk for impaired skin integrity will decrease Outcome: Progressing   Problem: Tissue Perfusion: Goal: Adequacy of tissue perfusion will improve Outcome: Progressing   Problem: Education: Goal: Knowledge of General Education information will improve Description: Including pain rating scale, medication(s)/side effects and non-pharmacologic comfort measures Outcome: Progressing   Problem: Health Behavior/Discharge Planning: Goal: Ability to manage health-related needs will improve Outcome: Progressing   Problem: Clinical Measurements: Goal: Ability to maintain clinical measurements within normal limits will improve Outcome: Progressing Goal: Will remain free from infection Outcome: Progressing Goal: Diagnostic test results will improve Outcome: Progressing Goal: Respiratory complications will improve Outcome: Progressing Goal: Cardiovascular complication will  be avoided Outcome: Progressing   Problem: Activity: Goal: Risk for activity intolerance will decrease Outcome: Progressing   Problem: Nutrition: Goal: Adequate nutrition will be maintained Outcome: Progressing   Problem: Coping: Goal: Level of anxiety will decrease Outcome: Progressing   Problem: Elimination: Goal: Will not experience complications related to bowel motility Outcome: Progressing Goal: Will not experience complications related to urinary retention Outcome: Progressing   Problem: Pain Management: Goal: General experience of comfort will improve Outcome: Progressing   Problem: Safety: Goal: Ability to remain free from injury will improve Outcome: Progressing   Problem: Skin Integrity: Goal: Risk for impaired skin integrity will decrease Outcome: Progressing   Problem: Education: Goal: Knowledge of the prescribed therapeutic regimen will improve Outcome: Progressing   Problem: Coping: Goal: Ability to identify and develop effective coping behavior will improve Outcome: Progressing   Problem: Clinical Measurements: Goal: Quality of life will improve Outcome: Progressing   Problem: Respiratory: Goal: Verbalizations of increased ease of respirations will increase Outcome: Progressing   Problem: Role Relationship: Goal: Family's ability to cope with current situation will improve Outcome: Progressing Goal: Ability to verbalize concerns, feelings, and thoughts to partner or family member will improve Outcome: Progressing   Problem: Pain Management: Goal: Satisfaction with pain management regimen will improve Outcome: Progressing

## 2023-05-10 NOTE — Progress Notes (Signed)
Progress Note   Patient: Eddie Reed [id number] DOB: 02-08-1951 DOA: 05/07/2023     3 DOS: the patient was seen and examined on 05/10/2023   Brief hospital admission course: As per H&P written by Dr. Margo Aye on 05/08/2023  Eddie Santee Sr. is a 72 y.o. male with medical history significant for prior CVA, chronic sacral decubitus ulcer, hypertension, type 2 diabetes, hyperlipidemia, diabetic polyneuropathy, chronic anxiety/depression, BPH, who presents to the ED from SNF due to altered mental status and shortness of breath today.  Associated with worsening sacral decubitus ulcer.  EMS was activated.  Upon EMS arrival, the patient was saturating 82% on room air, improved on 4 L nasal cannula.  Reportedly the patient was going to start oral antibiotics today for sacral decubitus wound infection.  He was brought into the ED for further evaluation.   In the ED, somnolent but arousable to voices.  Minimally interactive.  Hypotensive despite IV fluid boluses and 20 mg of oral vasopressor, midodrine.  UA positive for pyuria.  Due to concern for sepsis secondary to sacral decubitus ulcer and presumptive UTI, peripheral blood cultures x 2 and urine culture were obtained.  Empiric IV antibiotics were initiated.     Low blood pressures refractory to IV fluid boluses, IV albumin, and midodrine.  The patient was started on vasopressor, Levophed, requiring ICU level of care.  Admitted by [facility], hospitalist service.   ED Course: Temperature 98.6.  BP 83/54, pulse 107, respiratory 21, O2 saturation 95% on 4 L nasal cannula.  Assessment and Plan: 1-septic shock secondary to UTI and acute on chronic osteomyelitis; patient also with acute metabolic encephalopathy -Very poor prognosis and 0 chances of getting this wound to heal. -After discussing with family members and involving palliative care decision was made to pursuit comfort measures and symptomatic management only -Appreciate assistance and  recommendation by palliative care. -Will continue supportive care and symptomatic management. -Patient vital signs stable for the most part; TOC contacted to search for inpatient hospice versus hospice outpatient prior to admission to skilled nursing facility.  Other medical problems contributing to patient's poor prognosis.: -Lactic acidosis -Hyponatremia -Hypoalbuminemia -Type 2 diabetes with neuropathy -Hypertension -Chronic indwelling Foley catheter due to chronic urinary retention and sacral wound. -Hypoxic respiratory failure requiring 4 L supplementation. -Concern for lower lobe infiltrate aspiration pneumonia    Subjective:  Expressing pain all over (mainly his back after dressing changes) and slightly restless.  No nausea, no vomiting, no abdominal pain.  Physical Exam: Vitals:   05/08/23 1400 05/09/23 0130 05/09/23 1438 05/10/23 1422  BP: (!) 125/56 99/63 (!) 86/54 (!) 173/102  Pulse: 76 83 84 (!) 114  Resp: 19 15 18 19   Temp:   97.6 F (36.4 C) 98.9 F (37.2 C)  TempSrc:   Oral   SpO2: 100% 100% 100% 95%  Weight:      Height:       General exam: Alert, awake, afebrile and expressing no nausea or vomiting. Respiratory system: Good saturation on 4 L nasal cannula supplementation.  No using accessory muscles. Cardiovascular system: Rate controlled, no rubs, no gallops, no JVD on exam. Gastrointestinal system: Abdomen is nondistended positive bowel sounds appreciated.  PEG tube and colostomy bag in place. Central nervous system: No new focal neurological deficits. Extremities: No cyanosis or clubbing. Skin: Stage IV sacral decubitus ulcer with foul smell and active drainage; also bilateral ischial tuberosity wounds appreciated.  Present at time of admission Psychiatry: Limited examination; flat affect appreciated.  Family  Communication: No family at bedside.  Disposition: Status is: Inpatient Remains inpatient appropriate because: Continue symptomatic management  and comfort care.   Planned Discharge Destination:  Imminent hospital death versus inpatient hospice.    Time spent: 35 minutes  Author: Vassie Loll, MD 05/10/2023 3:53 PM  For on call review [email or web address].

## 2023-05-10 NOTE — TOC Progression Note (Signed)
Transition of Care (TOC) - Progression Note    Patient Details  Name: Eddie GARVEN Sr. MRN: [id number] Date of Birth: 09/30/1950  Transition of Care [facility]) CM/SW Contact  Catalina Gravel, LCSW Phone Number: 05/10/2023, 4:25 PM  Clinical Narrative:    MD requested [facility] consult to determine if pt can return to [facility] /hospice or will an inpatient hospice referral need to be made. CSW consulted with Debbie from [facility]- she states she will explore this and follow up first thing Sunday morning.  TOC to follow. CSW updated TOC consult with comment.    Expected Discharge Plan: Skilled Nursing Facility Barriers to Discharge: Continued Medical Work up  Expected Discharge Plan and Services In-house Referral: Clinical Social Work Discharge Planning Services: CM Consult                                           Social Determinants of Health (SDOH) Interventions SDOH Screenings   Food Insecurity: No Food Insecurity (02/12/2023)  Housing: Low Risk  (02/12/2023)  Transportation Needs: No Transportation Needs (02/12/2023)  Utilities: Patient Unable To Answer (05/08/2023)  Tobacco Use: High Risk (05/08/2023)  Health Literacy: Medium Risk (01/09/2021)   Received from [facility], [facility]    Readmission Risk Interventions    05/08/2023   11:55 AM  Readmission Risk Prevention Plan  Transportation Screening Complete  Home Care Screening Complete  Medication Review (RN CM) Complete

## 2023-05-11 DIAGNOSIS — A419 Sepsis, unspecified organism: Secondary | ICD-10-CM | POA: Diagnosis not present

## 2023-05-11 DIAGNOSIS — M8668 Other chronic osteomyelitis, other site: Secondary | ICD-10-CM | POA: Diagnosis not present

## 2023-05-11 DIAGNOSIS — E43 Unspecified severe protein-calorie malnutrition: Secondary | ICD-10-CM | POA: Diagnosis not present

## 2023-05-11 DIAGNOSIS — R6521 Severe sepsis with septic shock: Secondary | ICD-10-CM | POA: Diagnosis not present

## 2023-05-11 MED ORDER — MORPHINE SULFATE (PF) 4 MG/ML IV SOLN
3.0000 mg | INTRAVENOUS | Status: DC | PRN
  Administered 2023-05-11 – 2023-05-12 (×4): 3 mg via INTRAVENOUS
  Filled 2023-05-11 (×4): qty 1

## 2023-05-11 MED ORDER — FENTANYL 50 MCG/HR TD PT72
1.0000 | MEDICATED_PATCH | TRANSDERMAL | Status: DC
  Administered 2023-05-11: 1 via TRANSDERMAL
  Filled 2023-05-11: qty 1

## 2023-05-11 NOTE — Progress Notes (Signed)
Progress Note   Patient: Eddie Reed [id number] DOB: Jul 05, 1950 DOA: 05/07/2023     4 DOS: the patient was seen and examined on 05/11/2023   Brief hospital admission course: As per H&P written by Dr. Margo Aye on 05/08/2023  Eddie Santee Sr. is a 72 y.o. male with medical history significant for prior CVA, chronic sacral decubitus ulcer, hypertension, type 2 diabetes, hyperlipidemia, diabetic polyneuropathy, chronic anxiety/depression, BPH, who presents to the ED from SNF due to altered mental status and shortness of breath today.  Associated with worsening sacral decubitus ulcer.  EMS was activated.  Upon EMS arrival, the patient was saturating 82% on room air, improved on 4 L nasal cannula.  Reportedly the patient was going to start oral antibiotics today for sacral decubitus wound infection.  He was brought into the ED for further evaluation.   In the ED, somnolent but arousable to voices.  Minimally interactive.  Hypotensive despite IV fluid boluses and 20 mg of oral vasopressor, midodrine.  UA positive for pyuria.  Due to concern for sepsis secondary to sacral decubitus ulcer and presumptive UTI, peripheral blood cultures x 2 and urine culture were obtained.  Empiric IV antibiotics were initiated.     Low blood pressures refractory to IV fluid boluses, IV albumin, and midodrine.  The patient was started on vasopressor, Levophed, requiring ICU level of care.  Admitted by [facility], hospitalist service.   ED Course: Temperature 98.6.  BP 83/54, pulse 107, respiratory 21, O2 saturation 95% on 4 L nasal cannula.  Assessment and Plan: 1-septic shock secondary to UTI and acute on chronic osteomyelitis; patient also with acute metabolic encephalopathy -Very poor prognosis and 0 chances of getting this wound to heal. -After discussing with family members and involving palliative care, decision has been made to pursuit comfort measures and symptomatic management only -Appreciate assistance and  recommendation by palliative care. -Will continue supportive care and symptomatic management. -Patient vital signs stable for the most part; TOC contacted to search for inpatient hospice versus hospice outpatient prior to admission to skilled nursing facility. -Continue scheduled morphine, adjusted dose of as needed morphine for breakthrough and starting fentanyl patch. -Continue to follow symptomatic response/relief.  Other medical problems contributing to patient's poor prognosis.: -Lactic acidosis -Hyponatremia -Hypoalbuminemia -Type 2 diabetes with neuropathy -Hypertension -Chronic indwelling Foley catheter due to chronic urinary retention and sacral wound. -Hypoxic respiratory failure requiring 4 L supplementation. -Concern for lower lobe infiltrate aspiration pneumonia    Subjective:  Easily aroused; no nausea, no vomiting, complaining of generalized pain and being restless.  Physical Exam: Vitals:   05/09/23 0130 05/09/23 1438 05/10/23 1422 05/10/23 1954  BP: 99/63 (!) 86/54 (!) 173/102 (!) 140/85  Pulse: 83 84 (!) 114   Resp: 15 18 19 16   Temp:  97.6 F (36.4 C) 98.9 F (37.2 C) 98.3 F (36.8 C)  TempSrc:  Oral  Axillary  SpO2: 100% 100% 95% 100%  Weight:      Height:       General exam: Afebrile, no nausea or vomiting; arousable and able to answer simple questions; patient expressing to be in significant pain. Respiratory system: Positive scattered rhonchi bilaterally; 4 L in place.  No using accessory muscle. Cardiovascular system:RRR. No rubs or gallops. Gastrointestinal system: Abdomen is nondistended, soft, with positive bowel sounds.  PEG tube in place; colostomy bag in place. Central nervous system: No new focal neurologic deficits. Extremities: No cyanosis or clubbing. Skin: No petechiae; stage IV sacral pressure injury with active  drainage and foul smell; bilateral ischial tuberosity pressure injury also appreciated on exam.  Present at time of  admission. Psychiatry: Flat affect.  Family Communication: No family at bedside.  Disposition: Status is: Inpatient Remains inpatient appropriate because: Continue symptomatic management and comfort care.   Planned Discharge Destination:  Imminent hospital death versus inpatient hospice.  Time spent: 35 minutes  Author: Vassie Loll, MD 05/11/2023 10:44 AM  For on call review [email or web address].

## 2023-05-11 NOTE — Plan of Care (Signed)
  Problem: Education: Goal: Ability to describe self-care measures that may prevent or decrease complications (Diabetes Survival Skills Education) will improve Outcome: Progressing Goal: Individualized Educational Video(s) Outcome: Progressing   Problem: Coping: Goal: Ability to adjust to condition or change in health will improve Outcome: Progressing   Problem: Fluid Volume: Goal: Ability to maintain a balanced intake and output will improve Outcome: Progressing   Problem: Health Behavior/Discharge Planning: Goal: Ability to identify and utilize available resources and services will improve Outcome: Progressing Goal: Ability to manage health-related needs will improve Outcome: Progressing   Problem: Metabolic: Goal: Ability to maintain appropriate glucose levels will improve Outcome: Progressing   Problem: Nutritional: Goal: Maintenance of adequate nutrition will improve Outcome: Progressing Goal: Progress toward achieving an optimal weight will improve Outcome: Progressing   Problem: Skin Integrity: Goal: Risk for impaired skin integrity will decrease Outcome: Progressing   Problem: Tissue Perfusion: Goal: Adequacy of tissue perfusion will improve Outcome: Progressing   Problem: Education: Goal: Knowledge of General Education information will improve Description: Including pain rating scale, medication(s)/side effects and non-pharmacologic comfort measures Outcome: Progressing   Problem: Health Behavior/Discharge Planning: Goal: Ability to manage health-related needs will improve Outcome: Progressing   Problem: Clinical Measurements: Goal: Ability to maintain clinical measurements within normal limits will improve Outcome: Progressing Goal: Will remain free from infection Outcome: Progressing Goal: Diagnostic test results will improve Outcome: Progressing Goal: Respiratory complications will improve Outcome: Progressing Goal: Cardiovascular complication will  be avoided Outcome: Progressing   Problem: Activity: Goal: Risk for activity intolerance will decrease Outcome: Progressing   Problem: Nutrition: Goal: Adequate nutrition will be maintained Outcome: Progressing   Problem: Coping: Goal: Level of anxiety will decrease Outcome: Progressing   Problem: Elimination: Goal: Will not experience complications related to bowel motility Outcome: Progressing Goal: Will not experience complications related to urinary retention Outcome: Progressing   Problem: Pain Management: Goal: General experience of comfort will improve Outcome: Progressing   Problem: Safety: Goal: Ability to remain free from injury will improve Outcome: Progressing   Problem: Skin Integrity: Goal: Risk for impaired skin integrity will decrease Outcome: Progressing   Problem: Education: Goal: Knowledge of the prescribed therapeutic regimen will improve Outcome: Progressing   Problem: Coping: Goal: Ability to identify and develop effective coping behavior will improve Outcome: Progressing   Problem: Clinical Measurements: Goal: Quality of life will improve Outcome: Progressing   Problem: Respiratory: Goal: Verbalizations of increased ease of respirations will increase Outcome: Progressing   Problem: Role Relationship: Goal: Family's ability to cope with current situation will improve Outcome: Progressing Goal: Ability to verbalize concerns, feelings, and thoughts to partner or family member will improve Outcome: Progressing   Problem: Pain Management: Goal: Satisfaction with pain management regimen will improve Outcome: Progressing

## 2023-05-11 NOTE — Progress Notes (Signed)
Patient rested from approx 11 am until 1pm with new pain regime before found to be moaning again and wincing. Medicated with scheduled meds.

## 2023-05-11 NOTE — TOC Progression Note (Signed)
Transition of Care (TOC) - Progression Note    Patient Details  Name: Eddie JONASSON Sr. MRN: [id number] Date of Birth: 1950-06-20  Transition of Care [facility]) CM/SW Contact  Catalina Gravel, LCSW Phone Number: 05/11/2023, 12:31 PM  Clinical Narrative:     CSW followed up with Debbie at [facility] regarding return on Hospice care.  Debbie states they would have to ask Loss adjuster, chartered as not certain that pt has [employer]. CSW referred pt the [facility] via hub and sent text to [place]. TOC to follow.   Expected Discharge Plan: Skilled Nursing Facility Barriers to Discharge: Continued Medical Work up  Expected Discharge Plan and Services In-house Referral: Clinical Social Work Discharge Planning Services: CM Consult                                           Social Determinants of Health (SDOH) Interventions SDOH Screenings   Food Insecurity: No Food Insecurity (02/12/2023)  Housing: Low Risk  (02/12/2023)  Transportation Needs: No Transportation Needs (02/12/2023)  Utilities: Patient Unable To Answer (05/08/2023)  Tobacco Use: High Risk (05/08/2023)  Health Literacy: Medium Risk (01/09/2021)   Received from [facility], [facility]    Readmission Risk Interventions    05/08/2023   11:55 AM  Readmission Risk Prevention Plan  Transportation Screening Complete  Home Care Screening Complete  Medication Review (RN CM) Complete

## 2023-05-11 NOTE — Progress Notes (Signed)
Patient moaning again and tearful. Provided prn pain medications as ordered. Appeared to help.

## 2023-05-11 NOTE — Plan of Care (Signed)
  Problem: Nutritional: Goal: Maintenance of adequate nutrition will improve Outcome: Progressing   Problem: Tissue Perfusion: Goal: Adequacy of tissue perfusion will improve Outcome: Progressing   Problem: Education: Goal: Knowledge of General Education information will improve Description: Including pain rating scale, medication(s)/side effects and non-pharmacologic comfort measures Outcome: Progressing   Problem: Health Behavior/Discharge Planning: Goal: Ability to manage health-related needs will improve Outcome: Progressing

## 2023-05-11 NOTE — Progress Notes (Signed)
Patient has been moaning; uncomfortable throughout morning shift. Repositioned patient and changed patient's wound dressing. Attempted oral care; patient stated "Hurt, hurt", repeatedly. Provided pain medications as ordered with little relief. Notified Dr. Gwenlyn Perking; new orders placed. Pain patch placed to patient's right arm and dated.

## 2023-05-12 DIAGNOSIS — Z7189 Other specified counseling: Secondary | ICD-10-CM

## 2023-05-12 DIAGNOSIS — M8668 Other chronic osteomyelitis, other site: Secondary | ICD-10-CM | POA: Diagnosis not present

## 2023-05-12 NOTE — Progress Notes (Signed)
   05/12/23 1130  Spiritual Encounters  Type of Visit Initial  Care provided to: Pt not available  Reason for visit Routine spiritual support  OnCall Visit No   Chaplain attempted to visit with the patient, Eddie Reed, he was sleeping at the time of my visit and I did not wish to disturb him.   Valerie Roys [facility]  [phone]

## 2023-05-12 NOTE — Progress Notes (Signed)
Palliative:  Mr. Mcgloin is lying quietly in bed.  He appears acutely/chronically ill and very frail.  He is able to whisper his name, but not where we are.  I do not believe that he can make his needs known.  There is no family at bedside at this time.  Bedside nursing staff is present attending to symptom management needs.    Mr. Stephan has been evaluated by hospice of [facility] for residential placement.  He has been offered a bed at [facility].  Hospice RN conference with family.  Face-to-face conference with bedside nursing staff, transition of care team, attending, hospice RN related to patient condition, needs, goals of care, disposition.  Plan: Full comfort care, requesting residential hospice at [facility]. Prognosis: Anticipate less than 2 weeks. DNR/goldenrod form completed and placed on chart.  50 minutes  Lillia Carmel, NP Palliative medicine team Team phone [phone]

## 2023-05-12 NOTE — TOC Transition Note (Addendum)
Transition of Care [facility]) - Discharge Note   Patient Details  Name: Eddie LENHOFF Sr. MRN: [id number] Date of Birth: 02-Apr-1951  Transition of Care [facility]) CM/SW Contact:  Leitha Bleak, RN Phone Number: 05/12/2023, 12:28 PM   Clinical Narrative:   Hospice RN at the bedside with family to assess. [facility] called TOC to accept patient. They have a bed, family is ready to discharge there today. MD will complete DC summary, RN calling report. [name] is calling transport.    Final next level of care: Hospice Medical Facility Barriers to Discharge: Barriers Resolved  Patient Goals and CMS Choice Patient states their goals for this hospitalization and ongoing recovery are:: comfort care CMS Medicare.gov Compare Post Acute Care list provided to:: Patient Choice offered to / list presented to : Patient      Discharge Placement                Patient to be transferred to facility by: EMS Name of family member notified: family at bedside with hospice RN- son, Eddie Reed Patient and family notified of of transfer: 05/12/23  Discharge Plan and Services Additional resources added to the After Visit Summary for   In-house Referral: Clinical Social Work Discharge Planning Services: CM Consult                HH Agency: [facility]     Social Drivers of Health (SDOH) Interventions SDOH Screenings   Food Insecurity: No Food Insecurity (02/12/2023)  Housing: Low Risk  (02/12/2023)  Transportation Needs: No Transportation Needs (02/12/2023)  Utilities: Patient Unable To Answer (05/08/2023)  Tobacco Use: High Risk (05/08/2023)  Health Literacy: Medium Risk (01/09/2021)   Received from [facility], [facility]     Readmission Risk Interventions    05/08/2023   11:55 AM  Readmission Risk Prevention Plan  Transportation Screening Complete  Home Care Screening Complete  Medication Review (RN CM) Complete

## 2023-05-12 NOTE — Discharge Summary (Signed)
Physician Discharge Summary  Eddie LEROY Sr. UJW:[id number] DOB: 12-15-1950 DOA: 05/07/2023  PCP: Eloisa Northern, MD  Admit date: 05/07/2023  Discharge date: 05/12/2023  Admitted From:Home  Disposition:  [facility]   Recommendations for Outpatient Follow-up:  Follow up with [facility]  Home Health:None  Equipment/Devices:None  Discharge Condition:Stable  CODE STATUS: DNR/Comfort Care  Diet recommendation: Pleasure feeds  Brief/Interim Summary: Eddie CUPIT Sr. is a 72 y.o. male with medical history significant for prior CVA, chronic sacral decubitus ulcer, hypertension, type 2 diabetes, hyperlipidemia, diabetic polyneuropathy, chronic anxiety/depression, BPH, who presents to the ED from SNF due to altered mental status and shortness of breath today.  Associated with worsening sacral decubitus ulcer.  EMS was activated.  Upon EMS arrival, the patient was saturating 82% on room air, improved on 4 L nasal cannula.  Reportedly the patient was going to start oral antibiotics today for sacral decubitus wound infection.  He was brought into the ED for further evaluation.   In the ED, somnolent but arousable to voices.  Minimally interactive.  Hypotensive despite IV fluid boluses and 20 mg of oral vasopressor, midodrine.  UA positive for pyuria.  Due to concern for sepsis secondary to sacral decubitus ulcer and presumptive UTI, peripheral blood cultures x 2 and urine culture were obtained.  Empiric IV antibiotics were initiated.     Low blood pressures refractory to IV fluid boluses, IV albumin, and midodrine.  The patient was started on vasopressor, Levophed, requiring ICU level of care.  Admitted by [facility], hospitalist service.  Patient was admitted with septic shock secondary to UTI and acute on chronic osteomyelitis with associated acute metabolic encephalopathy.  He was seen by palliative care and multiple discussions were had with family members regarding the terminal nature of his  osteomyelitis and active infection.  They had decided to transition him to comfort measures during the course of the stay and he is now in stable condition for transfer to hospice facility with very poor prognosis.  No other acute events or concerns noted throughout the course of this hospitalization.  Discharge Diagnoses:  Principal Problem:   Osteomyelitis (HCC) Active Problems:   Protein-calorie malnutrition, severe  Principal discharge diagnosis: Septic shock secondary to UTI and acute on chronic osteomyelitis.  Associated acute metabolic encephalopathy.  Discharge Instructions  Discharge Instructions     Diet - low sodium heart healthy   Complete by: As directed    Increase activity slowly   Complete by: As directed    No wound care   Complete by: As directed       Allergies as of 05/12/2023       Reactions   Plavix [clopidogrel] Hives   Levaquin [levofloxacin] Hives   Cymbalta [duloxetine Hcl]    Headache, shortness of breath   Synthroid [levothyroxine] Other (See Comments)   Listed on MAR from facility   Paxil [paroxetine] Hives   Terbinafine Hcl Hives   Lamisil         Medication List     STOP taking these medications    ascorbic acid 500 MG tablet Commonly known as: VITAMIN C   aspirin EC 81 MG tablet   atorvastatin 80 MG tablet Commonly known as: LIPITOR   carvedilol 3.125 MG tablet Commonly known as: COREG   doxazosin 4 MG tablet Commonly known as: CARDURA   enoxaparin 40 MG/0.4ML injection Commonly known as: LOVENOX   gabapentin 400 MG capsule Commonly known as: NEURONTIN   insulin aspart 100 UNIT/ML injection  Commonly known as: novoLOG   insulin glargine 100 UNIT/ML Solostar Pen Commonly known as: LANTUS   lansoprazole 30 MG capsule Commonly known as: PREVACID   LORazepam 0.5 MG tablet Commonly known as: ATIVAN   metoprolol succinate 25 MG 24 hr tablet Commonly known as: TOPROL-XL   oxycodone 5 MG capsule Commonly known as:  OXY-IR   Oxycodone HCl 10 MG Tabs   polyethylene glycol 17 g packet Commonly known as: MIRALAX / GLYCOLAX   risperiDONE 0.5 MG tablet Commonly known as: RISPERDAL   sertraline 25 MG tablet Commonly known as: ZOLOFT   tamsulosin 0.4 MG Caps capsule Commonly known as: FLOMAX   ZINC ACETATE PO        Allergies  Allergen Reactions   Plavix [Clopidogrel] Hives   Levaquin [Levofloxacin] Hives   Cymbalta [Duloxetine Hcl]     Headache, shortness of breath   Synthroid [Levothyroxine] Other (See Comments)    Listed on MAR from facility   Paxil [Paroxetine] Hives   Terbinafine Hcl Hives    Lamisil     Consultations: Palliative care   Procedures/Studies: CT CHEST ABDOMEN PELVIS W CONTRAST Result Date: 05/07/2023 CLINICAL DATA:  Sepsis, history of bedsore. EXAM: CT CHEST, ABDOMEN, AND PELVIS WITH CONTRAST TECHNIQUE: Multidetector CT imaging of the chest, abdomen and pelvis was performed following the standard protocol during bolus administration of intravenous contrast. RADIATION DOSE REDUCTION: This exam was performed according to the departmental dose-optimization program which includes automated exposure control, adjustment of the mA and/or kV according to patient size and/or use of iterative reconstruction technique. CONTRAST:  OMNIPAQUE IOHEXOL 300 MG/ML  SOLN COMPARISON:  02/25/2023. FINDINGS: CT CHEST FINDINGS Cardiovascular: The heart is normal in size and there is no pericardial effusion. Multi-vessel coronary artery calcifications are noted. There is atherosclerotic calcification of the aorta with aneurysmal dilatation of the ascending aorta measuring 4.1 cm. Pulmonary trunk is normal in caliber. Mediastinum/Nodes: No enlarged mediastinal, hilar, or axillary lymph nodes. Thyroid gland, trachea, and esophagus demonstrate no significant findings. Lungs/Pleura: Advanced paraseptal and centrilobular emphysematous changes are present in the lungs. Bronchiectasis is noted in  the lower lobes bilaterally with a associated atelectasis or infiltrate. There is a 7 mm nodule in the left lower lobe, axial image 91. There is a 5 mm nodule in the left lower lobe, axial image 86. No effusion or pneumothorax is seen. Musculoskeletal: Sternotomy wires are noted. Degenerative changes are present in the thoracic spine. An old healed rib fracture is noted on the left. No acute osseous abnormality is seen. CT ABDOMEN PELVIS FINDINGS Hepatobiliary: No focal liver abnormality is seen. Fatty infiltration of the liver is noted. Stones are present within the gallbladder. No biliary ductal dilatation is seen. Pancreas: Unremarkable. No pancreatic ductal dilatation or surrounding inflammatory changes. Spleen: Normal in size without focal abnormality. Adrenals/Urinary Tract: The right adrenal gland is within normal limits. A 1.2 cm nodule is noted in the left adrenal gland, previously characterized as adenoma. The kidneys enhance symmetrically. No renal calculus or hydronephrosis bilaterally. There is diffuse bladder wall thickening with perivesicular fat stranding. A Foley catheter is in place. Stomach/Bowel: A PEG tube terminates in the gastric body. Appendix appears normal. No bowel obstruction, free air or pneumatosis is seen. Left lower quadrant colostomy is noted. A few scattered diverticula are noted along the colon without evidence of diverticulitis. There is bowel wall thickening involving the colon at the splenic flexure with mild associated fat stranding. Vascular/Lymphatic: Aortic atherosclerosis. Nonspecific prominent lymph nodes are noted in  the periaortic space on the left. Reproductive: Prostate gland is enlarged with multiple brachytherapy seeds. Other: A small amount of perisplenic free fluid is noted extending into the anterior pararenal space on the left. Subcutaneous air is noted in the in low anterior abdominal wall on the right which may be iatrogenic. Musculoskeletal: There is a large  sacral decubitus ulcer in the midline posterior to the distal sacrum and coccyx with a associated bony erosions which have progressed from the previous exam. No abscess is seen. Degenerative changes are present in the lumbar spine. There is bilateral spondylolysis at L5 with grade 1 anterolisthesis at L5-S1. IMPRESSION: 1. Interval enlargement of sacral decubitus ulcer with associated erosions involving the distal sacrum and coccyx, concerning for osteomyelitis. No abscess is seen. 2. Diffuse bladder wall thickening with surrounding fat stranding, suggesting infectious or inflammatory cystitis. 3. Mild colonic wall thickening at the splenic flexure with surrounding fat stranding and a small amount of free fluid in the left upper quadrant, possible focal colitis. 4. Bilateral lower lobe bronchiectasis with associated atelectasis or infiltrate. 5. Left lower lobe pulmonary nodules measuring up to 7 mm. Non-contrast chest CT at 6-12 months is recommended. If the nodule is stable at time of repeat CT, then future CT at 18-24 months (from today's scan) is considered optional for low-risk patients, but is recommended for high-risk patients. This recommendation follows the consensus statement: Guidelines for Management of Incidental Pulmonary Nodules Detected on CT Images: From the Fleischner Society 2017; Radiology 2017; 284:228-243. 6. Emphysema. 7. Aortic atherosclerosis with aneurysmal dilatation of the ascending aorta measuring 4.1 cm. Recommend annual imaging followup by CTA or MRA. This recommendation follows 2010 ACCF/AHA/AATS/ACR/ASA/SCA/SCAI/SIR/STS/SVM Guidelines for the Diagnosis and Management of Patients with Thoracic Aortic Disease. Circulation. 2010; 121: O756-E332. Aortic aneurysm NOS (ICD10-I71.9) 8. Cholelithiasis. 9. Remaining ancillary findings as described above. Electronically Signed   By: Thornell Sartorius M.D.   On: 05/07/2023 22:41   DG Chest Port 1 View Result Date: 05/07/2023 CLINICAL DATA:   Altered mental status and shortness of breath EXAM: PORTABLE CHEST 1 VIEW COMPARISON:  02/25/2023 FINDINGS: Sternotomy and CABG. Stable cardiomediastinal silhouette. Aortic atherosclerotic calcification. Low lung volumes. Chronic bronchitic changes. Interstitial coarsening in the left lower lung has increased compared to 02/25/2023. No pleural effusion or pneumothorax. IMPRESSION: Increased interstitial coarsening in the left lower lung which may be due to atelectasis or infection. Electronically Signed   By: Minerva Fester M.D.   On: 05/07/2023 21:20     Discharge Exam: Vitals:   05/10/23 1954 05/11/23 2007  BP: (!) 140/85 (!) 146/92  Pulse:  (!) 114  Resp: 16 15  Temp: 98.3 F (36.8 C) (!) 97.5 F (36.4 C)  SpO2: 100% 96%   Vitals:   05/09/23 1438 05/10/23 1422 05/10/23 1954 05/11/23 2007  BP: (!) 86/54 (!) 173/102 (!) 140/85 (!) 146/92  Pulse: 84 (!) 114  (!) 114  Resp: 18 19 16 15   Temp: 97.6 F (36.4 C) 98.9 F (37.2 C) 98.3 F (36.8 C) (!) 97.5 F (36.4 C)  TempSrc: Oral  Axillary Oral  SpO2: 100% 95% 100% 96%  Weight:      Height:        General: Pt is alert, awake, not in acute distress Cardiovascular: RRR, S1/S2 +, no rubs, no gallops Respiratory: CTA bilaterally, no wheezing, no rhonchi Abdominal: Soft, NT, ND, bowel sounds + Extremities: no edema, no cyanosis    The results of significant diagnostics from this hospitalization (including imaging, microbiology, ancillary and  laboratory) are listed below for reference.     Microbiology: Recent Results (from the past 240 hours)  Culture, blood (Routine x 2)     Status: Abnormal   Collection Time: 05/07/23  7:40 PM   Specimen: Right Antecubital; Blood  Result Value Ref Range Status   Specimen Description   Final    RIGHT ANTECUBITAL Performed at [facility], [address], [place], [place] [address]    Special Requests   Final    BOTTLES DRAWN AEROBIC AND ANAEROBIC Blood Culture adequate volume Performed  at [facility], [address] [place], [place] [address]    Culture  Setup Time   Final    GRAM NEGATIVE RODS ANAEROBIC BOTTLE ONLY CRITICAL RESULT CALLED TO, READ BACK BY AND VERIFIED WITH: ASHLEY SHELTON AT 1314 05/08/2023 BY T KENNEDY CRITICAL RESULT CALLED TO, READ BACK BY AND VERIFIED WITH: RN EDGAR TINAJERO ON 05/08/23 @ 1725 BY DRT Performed at [facility], [address] [place], [place] [address]    Culture PROTEUS MIRABILIS (A)  Final   Report Status 05/10/2023 FINAL  Final   Organism ID, Bacteria PROTEUS MIRABILIS  Final   Organism ID, Bacteria PROTEUS MIRABILIS  Final      Susceptibility   Proteus mirabilis - KIRBY BAUER*    CEFAZOLIN RESISTANT Resistant    Proteus mirabilis - MIC*    AMPICILLIN <=2 SENSITIVE Sensitive     CEFEPIME <=0.12 SENSITIVE Sensitive     CEFTAZIDIME <=1 SENSITIVE Sensitive     CEFTRIAXONE <=0.25 SENSITIVE Sensitive     CIPROFLOXACIN <=0.25 SENSITIVE Sensitive     GENTAMICIN <=1 SENSITIVE Sensitive     IMIPENEM 8 INTERMEDIATE Intermediate     TRIMETH/SULFA <=20 SENSITIVE Sensitive     AMPICILLIN/SULBACTAM <=2 SENSITIVE Sensitive     PIP/TAZO <=4 SENSITIVE Sensitive ug/mL    * PROTEUS MIRABILIS    PROTEUS MIRABILIS  Urine Culture     Status: Abnormal   Collection Time: 05/07/23  7:40 PM   Specimen: Urine, Random  Result Value Ref Range Status   Specimen Description   Final    URINE, RANDOM Performed at [facility], [address], [place], [place] [address]    Special Requests   Final    NONE Reflexed from [id number] Performed at [facility], [address], [place], [place] [address]    Culture >=100,000 COLONIES/mL PROTEUS MIRABILIS (A)  Final   Report Status 05/10/2023 FINAL  Final   Organism ID, Bacteria PROTEUS MIRABILIS (A)  Final      Susceptibility   Proteus mirabilis - MIC*    AMPICILLIN >=32 RESISTANT Resistant     CEFAZOLIN 8 SENSITIVE Sensitive     CEFEPIME <=0.12 SENSITIVE Sensitive     CEFTRIAXONE <=0.25 SENSITIVE  Sensitive     CIPROFLOXACIN >=4 RESISTANT Resistant     GENTAMICIN <=1 SENSITIVE Sensitive     IMIPENEM 1 SENSITIVE Sensitive     NITROFURANTOIN 128 RESISTANT Resistant     TRIMETH/SULFA >=320 RESISTANT Resistant     AMPICILLIN/SULBACTAM 8 SENSITIVE Sensitive     PIP/TAZO <=4 SENSITIVE Sensitive ug/mL    * >=100,000 COLONIES/mL PROTEUS MIRABILIS  Blood Culture ID Panel (Reflexed)     Status: Abnormal   Collection Time: 05/07/23  7:40 PM  Result Value Ref Range Status   Enterococcus faecalis NOT DETECTED NOT DETECTED Final   Enterococcus Faecium NOT DETECTED NOT DETECTED Final   Listeria monocytogenes NOT DETECTED NOT DETECTED Final   Staphylococcus species NOT DETECTED  NOT DETECTED Final   Staphylococcus aureus (BCID) NOT DETECTED NOT DETECTED Final   Staphylococcus epidermidis NOT DETECTED NOT DETECTED Final   Staphylococcus lugdunensis NOT DETECTED NOT DETECTED Final   Streptococcus species NOT DETECTED NOT DETECTED Final   Streptococcus agalactiae NOT DETECTED NOT DETECTED Final   Streptococcus pneumoniae NOT DETECTED NOT DETECTED Final   Streptococcus pyogenes NOT DETECTED NOT DETECTED Final   A.calcoaceticus-baumannii NOT DETECTED NOT DETECTED Final   Bacteroides fragilis NOT DETECTED NOT DETECTED Final   Enterobacterales DETECTED (A) NOT DETECTED Final    Comment: Enterobacterales represent a large order of gram negative bacteria, not a single organism. CRITICAL RESULT CALLED TO, READ BACK BY AND VERIFIED WITH: RN EDGAR TINAJERO ON 05/08/23 @ 1725 BY DRT    Enterobacter cloacae complex NOT DETECTED NOT DETECTED Final   Escherichia coli NOT DETECTED NOT DETECTED Final   Klebsiella aerogenes NOT DETECTED NOT DETECTED Final   Klebsiella oxytoca NOT DETECTED NOT DETECTED Final   Klebsiella pneumoniae NOT DETECTED NOT DETECTED Final   Proteus species DETECTED (A) NOT DETECTED Final    Comment: CRITICAL RESULT CALLED TO, READ BACK BY AND VERIFIED WITH: RN EDGAR TINAJERO ON  05/08/23 @ 1725 BY DRT    Salmonella species NOT DETECTED NOT DETECTED Final   Serratia marcescens NOT DETECTED NOT DETECTED Final   Haemophilus influenzae NOT DETECTED NOT DETECTED Final   Neisseria meningitidis NOT DETECTED NOT DETECTED Final   Pseudomonas aeruginosa NOT DETECTED NOT DETECTED Final   Stenotrophomonas maltophilia NOT DETECTED NOT DETECTED Final   Candida albicans NOT DETECTED NOT DETECTED Final   Candida auris NOT DETECTED NOT DETECTED Final   Candida glabrata NOT DETECTED NOT DETECTED Final   Candida krusei NOT DETECTED NOT DETECTED Final   Candida parapsilosis NOT DETECTED NOT DETECTED Final   Candida tropicalis NOT DETECTED NOT DETECTED Final   Cryptococcus neoformans/gattii NOT DETECTED NOT DETECTED Final   CTX-M ESBL NOT DETECTED NOT DETECTED Final   Carbapenem resistance IMP NOT DETECTED NOT DETECTED Final   Carbapenem resistance KPC NOT DETECTED NOT DETECTED Final   Carbapenem resistance NDM NOT DETECTED NOT DETECTED Final   Carbapenem resist OXA 48 LIKE NOT DETECTED NOT DETECTED Final   Carbapenem resistance VIM NOT DETECTED NOT DETECTED Final    Comment: Performed at [facility], [address] [place], [place] [address]  Culture, blood (Routine x 2)     Status: None (Preliminary result)   Collection Time: 05/07/23  7:47 PM   Specimen: Left Antecubital; Blood  Result Value Ref Range Status   Specimen Description   Final    LEFT ANTECUBITAL Performed at [facility], [address], [place], [place] [address]    Special Requests   Final    BOTTLES DRAWN AEROBIC AND ANAEROBIC Blood Culture adequate volume Performed at [facility], [address], [place], [place] [address]    Culture  Setup Time   Final    GRAM POSITIVE COCCI AEROBIC BOTTLE ONLY Gram Stain Report Called to,Read Back By and Verified With: Thera Flake [phone], VIRAY,J CRITICAL RESULT CALLED TO, READ BACK BY AND VERIFIED WITH: S BSATTEREI,RN@0530  05/09/23 MK Performed at [facility], [address], [place], [place] [address]    Culture [facility] POSITIVE COCCI  Final   Report Status PENDING  Incomplete  Blood Culture ID Panel (Reflexed)     Status: Abnormal   Collection Time: 05/07/23  7:47 PM  Result Value Ref Range Status   Enterococcus faecalis  NOT DETECTED NOT DETECTED Final   Enterococcus Faecium NOT DETECTED NOT DETECTED Final   Listeria monocytogenes NOT DETECTED NOT DETECTED Final   Staphylococcus species DETECTED (A) NOT DETECTED Final    Comment: CRITICAL RESULT CALLED TO, READ BACK BY AND VERIFIED WITH: S BSATTEREI,RN@0530  05/09/23 MK    Staphylococcus aureus (BCID) NOT DETECTED NOT DETECTED Final   Staphylococcus epidermidis DETECTED (A) NOT DETECTED Final    Comment: Methicillin (oxacillin) resistant coagulase negative staphylococcus. Possible blood culture contaminant (unless isolated from more than one blood culture draw or clinical case suggests pathogenicity). No antibiotic treatment is indicated for blood  culture contaminants. CRITICAL RESULT CALLED TO, READ BACK BY AND VERIFIED WITH: S BSATTEREI,RN@0530  05/09/23 MK    Staphylococcus lugdunensis NOT DETECTED NOT DETECTED Final   Streptococcus species NOT DETECTED NOT DETECTED Final   Streptococcus agalactiae NOT DETECTED NOT DETECTED Final   Streptococcus pneumoniae NOT DETECTED NOT DETECTED Final   Streptococcus pyogenes NOT DETECTED NOT DETECTED Final   A.calcoaceticus-baumannii NOT DETECTED NOT DETECTED Final   Bacteroides fragilis NOT DETECTED NOT DETECTED Final   Enterobacterales NOT DETECTED NOT DETECTED Final   Enterobacter cloacae complex NOT DETECTED NOT DETECTED Final   Escherichia coli NOT DETECTED NOT DETECTED Final   Klebsiella aerogenes NOT DETECTED NOT DETECTED Final   Klebsiella oxytoca NOT DETECTED NOT DETECTED Final   Klebsiella pneumoniae NOT DETECTED NOT DETECTED Final   Proteus species NOT DETECTED NOT DETECTED Final   Salmonella species NOT DETECTED NOT  DETECTED Final   Serratia marcescens NOT DETECTED NOT DETECTED Final   Haemophilus influenzae NOT DETECTED NOT DETECTED Final   Neisseria meningitidis NOT DETECTED NOT DETECTED Final   Pseudomonas aeruginosa NOT DETECTED NOT DETECTED Final   Stenotrophomonas maltophilia NOT DETECTED NOT DETECTED Final   Candida albicans NOT DETECTED NOT DETECTED Final   Candida auris NOT DETECTED NOT DETECTED Final   Candida glabrata NOT DETECTED NOT DETECTED Final   Candida krusei NOT DETECTED NOT DETECTED Final   Candida parapsilosis NOT DETECTED NOT DETECTED Final   Candida tropicalis NOT DETECTED NOT DETECTED Final   Cryptococcus neoformans/gattii NOT DETECTED NOT DETECTED Final   Methicillin resistance mecA/C DETECTED (A) NOT DETECTED Final    Comment: CRITICAL RESULT CALLED TO, READ BACK BY AND VERIFIED WITH: S BSATTEREI,RN@0530  05/09/23 MK Performed at [facility], [address], [place], [place] [address]   MRSA Next Gen by PCR, Nasal     Status: None   Collection Time: 05/08/23  4:33 AM   Specimen: Nasal Mucosa; Nasal Swab  Result Value Ref Range Status   MRSA by PCR Next Gen NOT DETECTED NOT DETECTED Final    Comment: (NOTE) The GeneXpert MRSA Assay (FDA approved for NASAL specimens only), is one component of a comprehensive MRSA colonization surveillance program. It is not intended to diagnose MRSA infection nor to guide or monitor treatment for MRSA infections. Test performance is not FDA approved in patients less than 102 years old. Performed at [facility], [address], [place], [place] [address]      Labs: BNP (last 3 results) No results for input(s): "BNP" in the last 8760 hours. Basic Metabolic Panel: Recent Labs  Lab 05/07/23 1940 05/08/23 0454  NA 128* 130*  K 4.2 3.9  CL 92* 102  CO2 28 20*  GLUCOSE 170* 214*  BUN 44* 43*  CREATININE 0.76 0.78  CALCIUM 7.8* 6.6*  MG  --  1.7  PHOS  --  3.8   Liver Function Tests:  Recent Labs  Lab 05/07/23 1940  05/08/23 0454  AST 68* 50*  ALT 94* 67*  ALKPHOS 117 76  BILITOT 0.3 0.4  PROT 5.7* 4.3*  ALBUMIN 1.8* <1.5*   No results for input(s): "LIPASE", "AMYLASE" in the last 168 hours. No results for input(s): "AMMONIA" in the last 168 hours. CBC: Recent Labs  Lab 05/07/23 1940 05/08/23 0454  WBC 20.6* 30.5*  NEUTROABS 15.9*  --   HGB 9.9* 8.3*  HCT 31.5* 26.0*  MCV 84.2 83.9  PLT 443* 390   Cardiac Enzymes: No results for input(s): "CKTOTAL", "CKMB", "CKMBINDEX", "TROPONINI" in the last 168 hours. BNP: Invalid input(s): "POCBNP" CBG: Recent Labs  Lab 05/08/23 0110 05/08/23 0430 05/08/23 0731 05/08/23 1123  GLUCAP 145* 193* 262* 278*   D-Dimer No results for input(s): "DDIMER" in the last 72 hours. Hgb A1c No results for input(s): "HGBA1C" in the last 72 hours. Lipid Profile No results for input(s): "CHOL", "HDL", "LDLCALC", "TRIG", "CHOLHDL", "LDLDIRECT" in the last 72 hours. Thyroid function studies No results for input(s): "TSH", "T4TOTAL", "T3FREE", "THYROIDAB" in the last 72 hours.  Invalid input(s): "FREET3" Anemia work up No results for input(s): "VITAMINB12", "FOLATE", "FERRITIN", "TIBC", "IRON", "RETICCTPCT" in the last 72 hours. Urinalysis    Component Value Date/Time   COLORURINE AMBER (A) 05/07/2023 1940   APPEARANCEUR CLOUDY (A) 05/07/2023 1940   APPEARANCEUR Clear 10/25/2020 1359   LABSPEC 1.018 05/07/2023 1940   PHURINE 9.0 (H) 05/07/2023 1940   GLUCOSEU NEGATIVE 05/07/2023 1940   HGBUR NEGATIVE 05/07/2023 1940   BILIRUBINUR NEGATIVE 05/07/2023 1940   BILIRUBINUR Negative 10/25/2020 1359   KETONESUR NEGATIVE 05/07/2023 1940   PROTEINUR >=300 (A) 05/07/2023 1940   NITRITE NEGATIVE 05/07/2023 1940   LEUKOCYTESUR LARGE (A) 05/07/2023 1940   Sepsis Labs Recent Labs  Lab 05/07/23 1940 05/08/23 0454  WBC 20.6* 30.5*   Microbiology Recent Results (from the past 240 hours)  Culture, blood (Routine x 2)     Status: Abnormal   Collection Time:  05/07/23  7:40 PM   Specimen: Right Antecubital; Blood  Result Value Ref Range Status   Specimen Description   Final    RIGHT ANTECUBITAL Performed at [facility], [address] [place], [place] [address]    Special Requests   Final    BOTTLES DRAWN AEROBIC AND ANAEROBIC Blood Culture adequate volume Performed at [facility], [address], [place], [place] [address]    Culture  Setup Time   Final    GRAM NEGATIVE RODS ANAEROBIC BOTTLE ONLY CRITICAL RESULT CALLED TO, READ BACK BY AND VERIFIED WITH: ASHLEY SHELTON AT 1314 05/08/2023 BY T KENNEDY CRITICAL RESULT CALLED TO, READ BACK BY AND VERIFIED WITH: RN EDGAR TINAJERO ON 05/08/23 @ 1725 BY DRT Performed at [facility], [address], [place], [place] [address]    Culture PROTEUS MIRABILIS (A)  Final   Report Status 05/10/2023 FINAL  Final   Organism ID, Bacteria PROTEUS MIRABILIS  Final   Organism ID, Bacteria PROTEUS MIRABILIS  Final      Susceptibility   Proteus mirabilis - KIRBY BAUER*    CEFAZOLIN RESISTANT Resistant    Proteus mirabilis - MIC*    AMPICILLIN <=2 SENSITIVE Sensitive     CEFEPIME <=0.12 SENSITIVE Sensitive     CEFTAZIDIME <=1 SENSITIVE Sensitive     CEFTRIAXONE <=0.25 SENSITIVE Sensitive     CIPROFLOXACIN <=0.25 SENSITIVE Sensitive     GENTAMICIN <=1 SENSITIVE Sensitive     IMIPENEM 8 INTERMEDIATE Intermediate  TRIMETH/SULFA <=20 SENSITIVE Sensitive     AMPICILLIN/SULBACTAM <=2 SENSITIVE Sensitive     PIP/TAZO <=4 SENSITIVE Sensitive ug/mL    * PROTEUS MIRABILIS    PROTEUS MIRABILIS  Urine Culture     Status: Abnormal   Collection Time: 05/07/23  7:40 PM   Specimen: Urine, Random  Result Value Ref Range Status   Specimen Description   Final    URINE, RANDOM Performed at [facility], [address], [place], [place] [address]    Special Requests   Final    NONE Reflexed from [id number] Performed at [facility], [address], [place], [place] [address]    Culture >=100,000 COLONIES/mL  PROTEUS MIRABILIS (A)  Final   Report Status 05/10/2023 FINAL  Final   Organism ID, Bacteria PROTEUS MIRABILIS (A)  Final      Susceptibility   Proteus mirabilis - MIC*    AMPICILLIN >=32 RESISTANT Resistant     CEFAZOLIN 8 SENSITIVE Sensitive     CEFEPIME <=0.12 SENSITIVE Sensitive     CEFTRIAXONE <=0.25 SENSITIVE Sensitive     CIPROFLOXACIN >=4 RESISTANT Resistant     GENTAMICIN <=1 SENSITIVE Sensitive     IMIPENEM 1 SENSITIVE Sensitive     NITROFURANTOIN 128 RESISTANT Resistant     TRIMETH/SULFA >=320 RESISTANT Resistant     AMPICILLIN/SULBACTAM 8 SENSITIVE Sensitive     PIP/TAZO <=4 SENSITIVE Sensitive ug/mL    * >=100,000 COLONIES/mL PROTEUS MIRABILIS  Blood Culture ID Panel (Reflexed)     Status: Abnormal   Collection Time: 05/07/23  7:40 PM  Result Value Ref Range Status   Enterococcus faecalis NOT DETECTED NOT DETECTED Final   Enterococcus Faecium NOT DETECTED NOT DETECTED Final   Listeria monocytogenes NOT DETECTED NOT DETECTED Final   Staphylococcus species NOT DETECTED NOT DETECTED Final   Staphylococcus aureus (BCID) NOT DETECTED NOT DETECTED Final   Staphylococcus epidermidis NOT DETECTED NOT DETECTED Final   Staphylococcus lugdunensis NOT DETECTED NOT DETECTED Final   Streptococcus species NOT DETECTED NOT DETECTED Final   Streptococcus agalactiae NOT DETECTED NOT DETECTED Final   Streptococcus pneumoniae NOT DETECTED NOT DETECTED Final   Streptococcus pyogenes NOT DETECTED NOT DETECTED Final   A.calcoaceticus-baumannii NOT DETECTED NOT DETECTED Final   Bacteroides fragilis NOT DETECTED NOT DETECTED Final   Enterobacterales DETECTED (A) NOT DETECTED Final    Comment: Enterobacterales represent a large order of gram negative bacteria, not a single organism. CRITICAL RESULT CALLED TO, READ BACK BY AND VERIFIED WITH: RN EDGAR TINAJERO ON 05/08/23 @ 1725 BY DRT    Enterobacter cloacae complex NOT DETECTED NOT DETECTED Final   Escherichia coli NOT DETECTED NOT DETECTED  Final   Klebsiella aerogenes NOT DETECTED NOT DETECTED Final   Klebsiella oxytoca NOT DETECTED NOT DETECTED Final   Klebsiella pneumoniae NOT DETECTED NOT DETECTED Final   Proteus species DETECTED (A) NOT DETECTED Final    Comment: CRITICAL RESULT CALLED TO, READ BACK BY AND VERIFIED WITH: RN EDGAR TINAJERO ON 05/08/23 @ 1725 BY DRT    Salmonella species NOT DETECTED NOT DETECTED Final   Serratia marcescens NOT DETECTED NOT DETECTED Final   Haemophilus influenzae NOT DETECTED NOT DETECTED Final   Neisseria meningitidis NOT DETECTED NOT DETECTED Final   Pseudomonas aeruginosa NOT DETECTED NOT DETECTED Final   Stenotrophomonas maltophilia NOT DETECTED NOT DETECTED Final   Candida albicans NOT DETECTED NOT DETECTED Final   Candida auris NOT DETECTED NOT DETECTED Final   Candida glabrata NOT DETECTED NOT DETECTED Final   Candida krusei  NOT DETECTED NOT DETECTED Final   Candida parapsilosis NOT DETECTED NOT DETECTED Final   Candida tropicalis NOT DETECTED NOT DETECTED Final   Cryptococcus neoformans/gattii NOT DETECTED NOT DETECTED Final   CTX-M ESBL NOT DETECTED NOT DETECTED Final   Carbapenem resistance IMP NOT DETECTED NOT DETECTED Final   Carbapenem resistance KPC NOT DETECTED NOT DETECTED Final   Carbapenem resistance NDM NOT DETECTED NOT DETECTED Final   Carbapenem resist OXA 48 LIKE NOT DETECTED NOT DETECTED Final   Carbapenem resistance VIM NOT DETECTED NOT DETECTED Final    Comment: Performed at [facility], [address], [place], [place] [address]  Culture, blood (Routine x 2)     Status: None (Preliminary result)   Collection Time: 05/07/23  7:47 PM   Specimen: Left Antecubital; Blood  Result Value Ref Range Status   Specimen Description   Final    LEFT ANTECUBITAL Performed at [facility], [address] [place], [place] [address]    Special Requests   Final    BOTTLES DRAWN AEROBIC AND ANAEROBIC Blood Culture adequate volume Performed at [facility],  [address] [place], [place] [address]    Culture  Setup Time   Final    GRAM POSITIVE COCCI AEROBIC BOTTLE ONLY Gram Stain Report Called to,Read Back By and Verified With: Thera Flake [phone], VIRAY,J CRITICAL RESULT CALLED TO, READ BACK BY AND VERIFIED WITH: S BSATTEREI,RN@0530  05/09/23 MK Performed at [facility], [address], [place], [place] [address]    Culture GRAM POSITIVE COCCI  Final   Report Status PENDING  Incomplete  Blood Culture ID Panel (Reflexed)     Status: Abnormal   Collection Time: 05/07/23  7:47 PM  Result Value Ref Range Status   Enterococcus faecalis NOT DETECTED NOT DETECTED Final   Enterococcus Faecium NOT DETECTED NOT DETECTED Final   Listeria monocytogenes NOT DETECTED NOT DETECTED Final   Staphylococcus species DETECTED (A) NOT DETECTED Final    Comment: CRITICAL RESULT CALLED TO, READ BACK BY AND VERIFIED WITH: S BSATTEREI,RN@0530  05/09/23 MK    Staphylococcus aureus (BCID) NOT DETECTED NOT DETECTED Final   Staphylococcus epidermidis DETECTED (A) NOT DETECTED Final    Comment: Methicillin (oxacillin) resistant coagulase negative staphylococcus. Possible blood culture contaminant (unless isolated from more than one blood culture draw or clinical case suggests pathogenicity). No antibiotic treatment is indicated for blood  culture contaminants. CRITICAL RESULT CALLED TO, READ BACK BY AND VERIFIED WITH: S BSATTEREI,RN@0530  05/09/23 MK    Staphylococcus lugdunensis NOT DETECTED NOT DETECTED Final   Streptococcus species NOT DETECTED NOT DETECTED Final   Streptococcus agalactiae NOT DETECTED NOT DETECTED Final   Streptococcus pneumoniae NOT DETECTED NOT DETECTED Final   Streptococcus pyogenes NOT DETECTED NOT DETECTED Final   A.calcoaceticus-baumannii NOT DETECTED NOT DETECTED Final   Bacteroides fragilis NOT DETECTED NOT DETECTED Final   Enterobacterales NOT DETECTED NOT DETECTED Final   Enterobacter cloacae complex NOT DETECTED NOT DETECTED  Final   Escherichia coli NOT DETECTED NOT DETECTED Final   Klebsiella aerogenes NOT DETECTED NOT DETECTED Final   Klebsiella oxytoca NOT DETECTED NOT DETECTED Final   Klebsiella pneumoniae NOT DETECTED NOT DETECTED Final   Proteus species NOT DETECTED NOT DETECTED Final   Salmonella species NOT DETECTED NOT DETECTED Final   Serratia marcescens NOT DETECTED NOT DETECTED Final   Haemophilus influenzae NOT DETECTED NOT DETECTED Final   Neisseria meningitidis NOT DETECTED NOT DETECTED Final   Pseudomonas aeruginosa NOT DETECTED NOT DETECTED Final  Stenotrophomonas maltophilia NOT DETECTED NOT DETECTED Final   Candida albicans NOT DETECTED NOT DETECTED Final   Candida auris NOT DETECTED NOT DETECTED Final   Candida glabrata NOT DETECTED NOT DETECTED Final   Candida krusei NOT DETECTED NOT DETECTED Final   Candida parapsilosis NOT DETECTED NOT DETECTED Final   Candida tropicalis NOT DETECTED NOT DETECTED Final   Cryptococcus neoformans/gattii NOT DETECTED NOT DETECTED Final   Methicillin resistance mecA/C DETECTED (A) NOT DETECTED Final    Comment: CRITICAL RESULT CALLED TO, READ BACK BY AND VERIFIED WITH: S BSATTEREI,RN@0530  05/09/23 MK Performed at [facility], [address] [place], [place] [address]   MRSA Next Gen by PCR, Nasal     Status: None   Collection Time: 05/08/23  4:33 AM   Specimen: Nasal Mucosa; Nasal Swab  Result Value Ref Range Status   MRSA by PCR Next Gen NOT DETECTED NOT DETECTED Final    Comment: (NOTE) The GeneXpert MRSA Assay (FDA approved for NASAL specimens only), is one component of a comprehensive MRSA colonization surveillance program. It is not intended to diagnose MRSA infection nor to guide or monitor treatment for MRSA infections. Test performance is not FDA approved in patients less than 109 years old. Performed at [facility], [address], [place], [place] [address]      Time coordinating discharge: 35 minutes  SIGNED:   Erick Blinks, DO [facility] 05/12/2023, 12:26 PM  If 7PM-7AM, please contact night-coverage [email or web address]

## 2023-05-12 NOTE — Progress Notes (Signed)
Report called to Statistician at [employer].

## 2023-05-15 LAB — CULTURE, BLOOD (ROUTINE X 2): Special Requests: ADEQUATE

## 2023-05-21 DEATH — deceased

## 2023-09-19 ENCOUNTER — Encounter: Payer: Self-pay | Admitting: Radiology
